# Patient Record
Sex: Female | Born: 1950
Health system: Southern US, Community
[De-identification: ages and names within clinical notes are randomized; demographics above are authoritative.]

## PROBLEM LIST (undated history)

## (undated) DIAGNOSIS — L719 Rosacea, unspecified: Secondary | ICD-10-CM

## (undated) DIAGNOSIS — Z789 Other specified health status: Secondary | ICD-10-CM

## (undated) HISTORY — PX: NO PAST SURGERIES: SHX2092

---

## 2000-11-16 HISTORY — PX: BREAST BIOPSY: SHX20

## 2005-03-13 ENCOUNTER — Ambulatory Visit: Payer: Self-pay | Admitting: Unknown Physician Specialty

## 2006-04-16 ENCOUNTER — Ambulatory Visit: Payer: Self-pay

## 2008-02-20 ENCOUNTER — Ambulatory Visit: Payer: Self-pay | Admitting: General Surgery

## 2009-10-18 ENCOUNTER — Encounter: Payer: Self-pay | Admitting: Orthopedic Surgery

## 2009-11-16 ENCOUNTER — Encounter: Payer: Self-pay | Admitting: Orthopedic Surgery

## 2009-12-17 ENCOUNTER — Encounter: Payer: Self-pay | Admitting: Orthopedic Surgery

## 2010-02-03 ENCOUNTER — Ambulatory Visit: Payer: Self-pay | Admitting: Orthopedic Surgery

## 2012-12-02 ENCOUNTER — Ambulatory Visit: Payer: Self-pay | Admitting: Family Medicine

## 2012-12-02 LAB — COMPREHENSIVE METABOLIC PANEL
Albumin: 4.5 g/dL (ref 3.4–5.0)
Alkaline Phosphatase: 62 U/L (ref 50–136)
Anion Gap: 6 — ABNORMAL LOW (ref 7–16)
BUN: 11 mg/dL (ref 7–18)
Bilirubin,Total: 0.4 mg/dL (ref 0.2–1.0)
Calcium, Total: 9.1 mg/dL (ref 8.5–10.1)
Chloride: 102 mmol/L (ref 98–107)
Co2: 28 mmol/L (ref 21–32)
EGFR (African American): >60
EGFR (Non-African Amer.): >60
Glucose: 89 mg/dL (ref 65–99)
Potassium: 3.8 mmol/L (ref 3.5–5.1)

## 2012-12-02 LAB — CBC WITH DIFFERENTIAL/PLATELET
Basophil #: 0.1 10*3/uL (ref 0.0–0.1)
Basophil %: 1.1 %
Eosinophil #: 0.1 10*3/uL (ref 0.0–0.7)
Eosinophil %: 2.7 %
HCT: 39 % (ref 35.0–47.0)
HGB: 12.6 g/dL (ref 12.0–16.0)
Lymphocyte #: 1.6 10*3/uL (ref 1.0–3.6)
Lymphocyte %: 29.5 %
MCH: 30.1 pg (ref 26.0–34.0)
MCV: 93 fL (ref 80–100)
Monocyte #: 0.4 x10 3/mm (ref 0.2–0.9)
Monocyte %: 7 %
Neutrophil #: 3.2 10*3/uL (ref 1.4–6.5)
Platelet: 318 10*3/uL (ref 150–440)
RDW: 12.6 % (ref 11.5–14.5)
WBC: 5.4 10*3/uL (ref 3.6–11.0)

## 2012-12-02 LAB — TSH: Thyroid Stimulating Horm: 1.66 u[IU]/mL

## 2012-12-23 ENCOUNTER — Ambulatory Visit: Payer: Self-pay | Admitting: Family Medicine

## 2013-06-19 ENCOUNTER — Ambulatory Visit: Payer: Self-pay | Admitting: Family Medicine

## 2013-11-01 ENCOUNTER — Other Ambulatory Visit: Payer: Self-pay | Admitting: Obstetrics and Gynecology

## 2013-11-01 DIAGNOSIS — Z803 Family history of malignant neoplasm of breast: Secondary | ICD-10-CM

## 2013-11-13 ENCOUNTER — Ambulatory Visit
Admission: RE | Admit: 2013-11-13 | Discharge: 2013-11-13 | Disposition: A | Discharge status: Home / Self Care | Payer: BC Managed Care – PPO | Source: Ambulatory Visit | Attending: Obstetrics and Gynecology | Admitting: Obstetrics and Gynecology

## 2013-11-13 DIAGNOSIS — Z803 Family history of malignant neoplasm of breast: Secondary | ICD-10-CM

## 2013-11-13 MED ORDER — GADOBENATE DIMEGLUMINE 529 MG/ML IV SOLN
15.0000 mL | Freq: Once | INTRAVENOUS | Status: AC | PRN
  Administered 2013-11-13: 15 mL via INTRAVENOUS

## 2014-08-08 LAB — CBC AND DIFFERENTIAL
HCT: 39 % (ref 36–46)
Hemoglobin: 13.4 g/dL (ref 12.0–16.0)
PLATELETS: 323 10*3/uL (ref 150–399)
WBC: 4.3 10^3/mL

## 2014-08-08 LAB — LIPID PANEL
Cholesterol: 216 mg/dL — AB (ref 0–200)
HDL: 100 mg/dL — AB (ref 35–70)
LDL Cholesterol: 100 mg/dL
TRIGLYCERIDES: 56 mg/dL (ref 40–160)

## 2014-08-08 LAB — HEPATIC FUNCTION PANEL
ALT: 15 U/L (ref 7–35)
AST: 23 U/L (ref 13–35)

## 2014-08-08 LAB — BASIC METABOLIC PANEL
BUN: 11 mg/dL (ref 4–21)
CREATININE: 0.7 mg/dL (ref 0.5–1.1)
Glucose: 95 mg/dL
POTASSIUM: 4.4 mmol/L (ref 3.4–5.3)
Sodium: 138 mmol/L (ref 137–147)

## 2014-08-08 LAB — TSH: TSH: 1.47 u[IU]/mL (ref 0.41–5.90)

## 2015-08-13 DIAGNOSIS — R319 Hematuria, unspecified: Secondary | ICD-10-CM | POA: Insufficient documentation

## 2015-08-13 DIAGNOSIS — Z8639 Personal history of other endocrine, nutritional and metabolic disease: Secondary | ICD-10-CM | POA: Insufficient documentation

## 2015-08-13 DIAGNOSIS — Z87898 Personal history of other specified conditions: Secondary | ICD-10-CM | POA: Insufficient documentation

## 2015-08-13 DIAGNOSIS — Z8669 Personal history of other diseases of the nervous system and sense organs: Secondary | ICD-10-CM | POA: Insufficient documentation

## 2015-08-14 ENCOUNTER — Ambulatory Visit (INDEPENDENT_AMBULATORY_CARE_PROVIDER_SITE_OTHER): Payer: BC Managed Care – PPO | Admitting: Family Medicine

## 2015-08-14 ENCOUNTER — Encounter: Payer: Self-pay | Admitting: Family Medicine

## 2015-08-14 VITALS — BP 106/70 | HR 74 | Temp 97.4°F | Resp 16 | Ht 69.0 in | Wt 173.0 lb

## 2015-08-14 DIAGNOSIS — Z0000 Encounter for general adult medical examination without abnormal findings: Secondary | ICD-10-CM | POA: Diagnosis not present

## 2015-08-14 DIAGNOSIS — Z1239 Encounter for other screening for malignant neoplasm of breast: Secondary | ICD-10-CM | POA: Diagnosis not present

## 2015-08-14 DIAGNOSIS — Z23 Encounter for immunization: Secondary | ICD-10-CM | POA: Diagnosis not present

## 2015-08-14 DIAGNOSIS — Z1211 Encounter for screening for malignant neoplasm of colon: Secondary | ICD-10-CM | POA: Diagnosis not present

## 2015-08-14 LAB — POCT URINALYSIS DIPSTICK
BILIRUBIN UA: NEGATIVE
GLUCOSE UA: NEGATIVE
Ketones, UA: NEGATIVE
Leukocytes, UA: NEGATIVE
Nitrite, UA: NEGATIVE
Protein, UA: NEGATIVE
RBC UA: NEGATIVE
Urobilinogen, UA: 0.2
pH, UA: 8

## 2015-08-14 LAB — IFOBT (OCCULT BLOOD): IFOBT: NEGATIVE

## 2015-08-14 NOTE — Progress Notes (Addendum)
Patient ID: Allison [name], female   DOB: 1951-07-17, 63 y.o.   MRN: [id number]        Patient: Allison [name], Female    DOB: January 14, 1951, 64 y.o.   MRN: [id number] Visit Date: 08/14/2015  Today's Allison [name]: Allison Rana, MD   Chief Complaint  Patient presents with  . Annual Exam   Subjective:    Annual physical exam Allison [name] is a 64 y.o. female who presents today for health maintenance and complete physical. She feels well. She reports exercising daily. She reports she is sleeping well.  08/08/14 CPE 08/08/14 Pap-neg; HPV-neg 02/20/08 Colonoscopy-WNL, per pt.   Lab Results  Component Value Date   WBC 4.3 08/08/2014   HGB 13.4 08/08/2014   HCT 39 08/08/2014   PLT 323 08/08/2014   GLUCOSE 89 12/02/2012   CHOL 216* 08/08/2014   TRIG 56 08/08/2014   HDL 100* 08/08/2014   LDLCALC 100 08/08/2014   ALT 15 08/08/2014   AST 23 08/08/2014   NA 138 08/08/2014   K 4.4 08/08/2014   CL 102 12/02/2012   CREATININE 0.7 08/08/2014   BUN 11 08/08/2014   CO2 28 12/02/2012   TSH 1.47 08/08/2014   Results for orders placed or performed in visit on 08/14/15  POCT urinalysis dipstick  Result Value Ref Range   Color, UA straw    Clarity, UA clear    Glucose, UA neg    Bilirubin, UA neg    Ketones, UA neg    Spec Grav, UA <=1.005    Blood, UA neg    pH, UA 8.0    Protein, UA neg    Urobilinogen, UA 0.2    Nitrite, UA neg    Leukocytes, UA Negative Negative    -----------------------------------------------------------------   Review of Systems  Constitutional: Negative.   HENT: Negative.   Eyes: Negative.   Respiratory: Negative.   Cardiovascular: Negative.   Gastrointestinal: Negative.   Endocrine: Negative.   Genitourinary: Negative.   Musculoskeletal: Negative.   Skin: Negative.   Allergic/Immunologic: Negative.   Neurological: Negative.   Hematological: Negative.   Psychiatric/Behavioral: Negative.     Social History She  reports that she has  never smoked. She has never used smokeless tobacco. She reports that she does not drink alcohol or use illicit drugs. Social History   Social History  . Marital Status: Married    Spouse Name: N/A  . Number of Children: N/A  . Years of Education: N/A   Social History Main Topics  . Smoking status: Never Smoker   . Smokeless tobacco: Never Used  . Alcohol Use: No     Comment: 2 glasses wine daily  . Drug Use: No  . Sexual Activity: Not Asked   Other Topics Concern  . None   Social History Narrative    Patient Active Problem List   Diagnosis Date Noted  . Blood in the urine 08/13/2015  . H/O hyperthyroidism 08/13/2015  . H/O disease 08/13/2015  . H/O amblyopia 08/13/2015    Past Surgical History  Procedure Laterality Date  . No past surgeries      Family History  Family Status  Relation Status Death Age  . Mother Deceased 72    bladder cancer  . Father Deceased 29    CVA  . Sister Deceased 86    breast cancer   Her family history includes Bladder Cancer in her mother; Breast cancer in her sister; Hyperlipidemia in her father; Hypertension in her brother; Stroke in  her father.    Allergies  Allergen Reactions  . Strawberry     difficulty breathing  . Tetracycline Rash    Previous Medications   B COMPLEX VITAMINS PO    Take by mouth.   CALCIUM CARB-CHOLECALCIFEROL (CALCIUM + D3) 600-200 MG-UNIT TABS    Take by mouth.   CHOLECALCIFEROL (VITAMIN D) 2000 UNITS TABLET    Take by mouth.   FERROUS SULFATE 325 (65 FE) MG TABLET    Take 325 mg by mouth daily with breakfast.   FINACEA 15 % FOAM    APPLY A SMALL AMOUNT ON THE SKIN DAILY    Patient Care Team: Allison Rana, MD as PCP - General (Family Medicine)     Objective:   Vitals: BP 106/70 mmHg  Pulse 74  Temp(Src) 97.4 F (36.3 C) (Oral)  Resp 16  Ht 5\' 9"  (1.753 m)  Wt 173 lb (78.472 kg)  BMI 25.54 kg/m2   Physical Exam  Constitutional: She is oriented to person, place, and time. She appears  well-developed and well-nourished.  HENT:  Head: Normocephalic and atraumatic.  Right Ear: Tympanic membrane, external ear and ear canal normal.  Left Ear: Tympanic membrane, external ear and ear canal normal.  Nose: Nose normal.  Mouth/Throat: Uvula is midline, oropharynx is clear and moist and mucous membranes are normal.  Eyes: Conjunctivae, EOM and lids are normal. Pupils are equal, round, and reactive to light.  Neck: Trachea normal and normal range of motion. Neck supple. Carotid bruit is not present. No thyroid mass and no thyromegaly present.  Cardiovascular: Normal rate, regular rhythm and normal heart sounds.   Pulmonary/Chest: Effort normal and breath sounds normal.  Abdominal: Soft. Normal appearance and bowel sounds are normal. There is no hepatosplenomegaly. There is no tenderness.  Genitourinary: No breast swelling, tenderness or discharge.  Musculoskeletal: Normal range of motion.  Lymphadenopathy:    She has no cervical adenopathy.    She has no axillary adenopathy.  Neurological: She is alert and oriented to person, place, and time. She has normal strength. No cranial nerve deficit.  Skin: Skin is warm, dry and intact.  Psychiatric: She has a normal mood and affect. Her speech is normal and behavior is normal. Judgment and thought content normal. Cognition and memory are normal.     Depression Screen PHQ 2/9 Scores 08/14/2015  PHQ - 2 Score 0   History  Alcohol Use No    Comment: 2 glasses wine daily      Assessment & Plan:     Routine Health Maintenance and Physical Exam  Exercise Activities and Dietary recommendations Goals    . Exercise 150 minutes per week (moderate activity)       Immunization History  Administered Date(s) Administered  . Influenza,inj,Quad PF,36+ Mos 08/14/2015  . Pneumococcal Conjugate-13 08/08/2014    Health Maintenance  Topic Date Due  . Hepatitis C Screening  03-01-1951  . HIV Screening  02/14/1967  . TETANUS/TDAP   02/14/1971  . PAP SMEAR  02/13/1973  . MAMMOGRAM  02/13/2002  . COLONOSCOPY  02/13/2002  . ZOSTAVAX  02/14/2012  . INFLUENZA VACCINE  06/17/2015   1. Annual physical exam Stable. Check labs.  - POCT urinalysis dipstick - CBC with Differential/Platelet - Comprehensive metabolic panel - Lipid Panel With LDL/HDL Ratio - TSH - Vitamin B12 - Vit D  25 hydroxy (rtn osteoporosis monitoring)  2. Breast cancer screening Ordered as below.  - MM Digital Diagnostic Bilat; Future - US BREAST COMPLETE UNI LEFT  INC AXILLA; Future - US BREAST COMPLETE UNI RIGHT INC AXILLA; Future  3. Colon cancer screening Normal.  - IFOBT POC (occult bld, rslt in office)  4. Need for influenza vaccination Given today.  - Flu Vaccine QUAD 36+ mos IM      Patient was seen and examined by Jerrell Belfast, MD, and note scribed by Lynford Humphrey, [place].   I have reviewed the document for accuracy and completeness and I agree with above. Jerrell Belfast, MD   Allison Rana, MD  --------------------------------------------------------------------

## 2015-08-14 NOTE — Patient Instructions (Signed)
Please call the [facility] at [facility] to schedule this at [phone]   

## 2015-08-15 LAB — COMPREHENSIVE METABOLIC PANEL
ALK PHOS: 52 IU/L (ref 39–117)
ALT: 12 IU/L (ref 0–32)
AST: 19 IU/L (ref 0–40)
Albumin/Globulin Ratio: 2.1 (ref 1.1–2.5)
Albumin: 4.6 g/dL (ref 3.6–4.8)
BUN/Creatinine Ratio: 11 (ref 11–26)
BUN: 7 mg/dL — AB (ref 8–27)
Bilirubin Total: 0.5 mg/dL (ref 0.0–1.2)
CO2: 23 mmol/L (ref 18–29)
CREATININE: 0.64 mg/dL (ref 0.57–1.00)
Calcium: 9.5 mg/dL (ref 8.7–10.3)
Chloride: 99 mmol/L (ref 97–108)
GFR calc Af Amer: 110 mL/min/{1.73_m2} (ref >59)
GFR calc non Af Amer: 95 mL/min/{1.73_m2} (ref >59)
GLOBULIN, TOTAL: 2.2 g/dL (ref 1.5–4.5)
GLUCOSE: 95 mg/dL (ref 65–99)
Potassium: 4.5 mmol/L (ref 3.5–5.2)
SODIUM: 139 mmol/L (ref 134–144)
Total Protein: 6.8 g/dL (ref 6.0–8.5)

## 2015-08-15 LAB — VITAMIN B12: VITAMIN B 12: 412 pg/mL (ref 211–946)

## 2015-08-15 LAB — CBC WITH DIFFERENTIAL/PLATELET
BASOS ABS: 0 10*3/uL (ref 0.0–0.2)
Basos: 1 %
EOS (ABSOLUTE): 0.1 10*3/uL (ref 0.0–0.4)
Eos: 3 %
Hematocrit: 37.3 % (ref 34.0–46.6)
Hemoglobin: 12.9 g/dL (ref 11.1–15.9)
Immature Grans (Abs): 0 10*3/uL (ref 0.0–0.1)
Immature Granulocytes: 0 %
LYMPHS ABS: 0.9 10*3/uL (ref 0.7–3.1)
Lymphs: 24 %
MCH: 31.5 pg (ref 26.6–33.0)
MCHC: 34.6 g/dL (ref 31.5–35.7)
MCV: 91 fL (ref 79–97)
MONOS ABS: 0.4 10*3/uL (ref 0.1–0.9)
Monocytes: 9 %
Neutrophils Absolute: 2.4 10*3/uL (ref 1.4–7.0)
Neutrophils: 63 %
Platelets: 308 10*3/uL (ref 150–379)
RBC: 4.1 x10E6/uL (ref 3.77–5.28)
RDW: 12.4 % (ref 12.3–15.4)
WBC: 3.9 10*3/uL (ref 3.4–10.8)

## 2015-08-15 LAB — LIPID PANEL WITH LDL/HDL RATIO
CHOLESTEROL TOTAL: 194 mg/dL (ref 100–199)
HDL: 87 mg/dL (ref >39)
LDL Calculated: 99 mg/dL (ref 0–99)
LDL/HDL RATIO: 1.1 ratio (ref 0.0–3.2)
TRIGLYCERIDES: 41 mg/dL (ref 0–149)
VLDL CHOLESTEROL CAL: 8 mg/dL (ref 5–40)

## 2015-08-15 LAB — TSH: TSH: 2.11 u[IU]/mL (ref 0.450–4.500)

## 2015-08-15 LAB — VITAMIN D 25 HYDROXY (VIT D DEFICIENCY, FRACTURES): Vit D, 25-Hydroxy: 39.5 ng/mL (ref 30.0–100.0)

## 2015-08-15 NOTE — Addendum Note (Signed)
Addended by: Jerrell Belfast on: 08/15/2015 03:03 PM   Modules accepted: Miquel Dunn

## 2015-08-16 ENCOUNTER — Telehealth: Payer: Self-pay

## 2015-08-16 NOTE — Telephone Encounter (Signed)
Advised pt of lab results. Pt verbally acknowledges understanding. Emily Drozdowski, CMA   

## 2015-08-16 NOTE — Telephone Encounter (Signed)
Pt called back.  She said if she didnt pick up please leave a message.  She has already seen her results on my chart but does want you to leave a message regardless.  Thanks Con Memos

## 2015-08-16 NOTE — Telephone Encounter (Signed)
LMTCB Emily Drozdowski, CMA  

## 2015-08-16 NOTE — Telephone Encounter (Signed)
-----   Message from Margarita Rana, MD sent at 08/15/2015 10:28 AM EDT ----- Labs normal. Please notify patient. Thanks.

## 2015-08-23 ENCOUNTER — Other Ambulatory Visit: Payer: Self-pay | Admitting: *Deleted

## 2015-08-23 ENCOUNTER — Inpatient Hospital Stay
Admission: RE | Admit: 2015-08-23 | Discharge: 2015-08-23 | Disposition: A | Discharge status: Home / Self Care | Payer: Self-pay | Source: Ambulatory Visit | Attending: *Deleted | Admitting: *Deleted

## 2015-08-23 DIAGNOSIS — Z139 Encounter for screening, unspecified: Secondary | ICD-10-CM

## 2015-09-02 ENCOUNTER — Ambulatory Visit
Admission: RE | Admit: 2015-09-02 | Discharge: 2015-09-02 | Disposition: A | Discharge status: Home / Self Care | Payer: BC Managed Care – PPO | Source: Ambulatory Visit | Attending: Family Medicine | Admitting: Family Medicine

## 2015-09-02 DIAGNOSIS — Z1239 Encounter for other screening for malignant neoplasm of breast: Secondary | ICD-10-CM

## 2015-09-02 DIAGNOSIS — Z1231 Encounter for screening mammogram for malignant neoplasm of breast: Secondary | ICD-10-CM | POA: Insufficient documentation

## 2015-09-03 ENCOUNTER — Other Ambulatory Visit: Payer: Self-pay | Admitting: Family Medicine

## 2015-09-03 DIAGNOSIS — R928 Other abnormal and inconclusive findings on diagnostic imaging of breast: Secondary | ICD-10-CM

## 2015-09-09 ENCOUNTER — Ambulatory Visit
Admission: RE | Admit: 2015-09-09 | Discharge: 2015-09-09 | Disposition: A | Discharge status: Home / Self Care | Payer: BC Managed Care – PPO | Source: Ambulatory Visit | Attending: Family Medicine | Admitting: Family Medicine

## 2015-09-09 DIAGNOSIS — R928 Other abnormal and inconclusive findings on diagnostic imaging of breast: Secondary | ICD-10-CM | POA: Diagnosis present

## 2016-05-26 ENCOUNTER — Ambulatory Visit (INDEPENDENT_AMBULATORY_CARE_PROVIDER_SITE_OTHER): Payer: BC Managed Care – PPO | Admitting: Physician Assistant

## 2016-05-26 ENCOUNTER — Encounter: Payer: Self-pay | Admitting: Physician Assistant

## 2016-05-26 VITALS — BP 112/70 | HR 64 | Temp 98.1°F | Resp 16 | Wt 175.0 lb

## 2016-05-26 DIAGNOSIS — M5432 Sciatica, left side: Secondary | ICD-10-CM | POA: Diagnosis not present

## 2016-05-26 DIAGNOSIS — M5431 Sciatica, right side: Secondary | ICD-10-CM | POA: Diagnosis not present

## 2016-05-26 DIAGNOSIS — M62830 Muscle spasm of back: Secondary | ICD-10-CM

## 2016-05-26 MED ORDER — CYCLOBENZAPRINE HCL 5 MG PO TABS: 5.0000 mg | ORAL_TABLET | Freq: Three times a day (TID) | ORAL | Status: DC | PRN

## 2016-05-26 MED ORDER — MELOXICAM 15 MG PO TABS: 15.0000 mg | ORAL_TABLET | Freq: Every day | ORAL | Status: DC

## 2016-05-26 NOTE — Progress Notes (Signed)
Patient: Allison [name] Female    DOB: 07-Sep-1951   65 y.o.   MRN: [id number] Visit Date: 05/26/2016  Today's Provider: Mar Daring, PA-C   Chief Complaint  Patient presents with  . Leg Pain   Subjective:    Leg Pain  The incident occurred more than 1 week ago (x 3 weeks). Injury mechanism: after helping washing husband's truck. The pain is present in the left leg (will radiate to left shoulder). The quality of the pain is described as aching and shooting. The pain is at a severity of 3/10 (can get to a 10/10). The pain is mild. The pain has been intermittent since onset. Pertinent negatives include no inability to bear weight, loss of motion, loss of sensation, muscle weakness or numbness (tingling sensation that radiates occasionally down left leg). Associated symptoms comments: Back pain, bruising. Exacerbated by: sitting incorrectly. She has tried acetaminophen for the symptoms. The treatment provided no relief.      Allergies  Allergen Reactions  . Strawberry Extract     difficulty breathing  . Tetracycline Rash   Current Meds  Medication Sig  . B COMPLEX VITAMINS PO Take by mouth.  . Calcium Carb-Cholecalciferol (CALCIUM + D3) 600-200 MG-UNIT TABS Take by mouth.  . Cholecalciferol (VITAMIN D) 2000 UNITS tablet Take by mouth.  . ferrous sulfate 325 (65 FE) MG tablet Take 325 mg by mouth daily with breakfast.  . FINACEA 15 % FOAM APPLY A SMALL AMOUNT ON THE SKIN DAILY    Review of Systems  Constitutional: Negative for fever, chills, diaphoresis, activity change, appetite change, fatigue and unexpected weight change.  HENT: Negative.   Respiratory: Negative for cough, chest tightness and shortness of breath.   Cardiovascular: Positive for leg swelling. Negative for chest pain and palpitations.  Musculoskeletal: Positive for myalgias and back pain. Negative for joint swelling, arthralgias, gait problem, neck pain and neck stiffness.  Neurological: Negative  for weakness and numbness (tingling sensation that radiates occasionally down left leg).  Hematological: Bruises/bleeds easily.    Social History  Substance Use Topics  . Smoking status: Never Smoker   . Smokeless tobacco: Never Used  . Alcohol Use: No     Comment: 2 glasses wine daily   Objective:   BP 112/70 mmHg  Pulse 64  Temp(Src) 98.1 F (36.7 C) (Oral)  Resp 16  Wt 175 lb (79.379 kg)  Physical Exam  Constitutional: She appears well-developed and well-nourished. No distress.  Neck: Normal range of motion. Neck supple.  Cardiovascular: Normal rate, regular rhythm and normal heart sounds.  Exam reveals no gallop and no friction rub.   No murmur heard. Pulmonary/Chest: Effort normal and breath sounds normal. No respiratory distress. She has no wheezes. She has no rales.  Musculoskeletal:       Cervical back: She exhibits spasm (spasm noted in upper trapezius on left). She exhibits normal range of motion, no tenderness and no bony tenderness.       Thoracic back: She exhibits normal range of motion, no tenderness, no bony tenderness and no spasm.       Lumbar back: She exhibits tenderness (over ischial tuberosity bilaterally). She exhibits normal range of motion, no bony tenderness, no deformity, no spasm and normal pulse.  Radiating symptoms down left leg greater than right with palpation of ischial tuberosity  Skin: She is not diaphoretic.  Vitals reviewed.      Assessment & Plan:     1. Bilateral sciatica Will  give meloxicam and flexeril as below. She is to try for 2 weeks. If symptoms worsen or fail to improve she is to call the office and we will go forth with obtaining xray of lumbar spine.  - meloxicam (MOBIC) 15 MG tablet; Take 1 tablet (15 mg total) by mouth daily.  Dispense: 30 tablet; Refill: 0 - cyclobenzaprine (FLEXERIL) 5 MG tablet; Take 1 tablet (5 mg total) by mouth 3 (three) times daily as needed for muscle spasms.  Dispense: 30 tablet; Refill: 1  2.  Muscle spasm of back See above medical treatment plan. - cyclobenzaprine (FLEXERIL) 5 MG tablet; Take 1 tablet (5 mg total) by mouth 3 (three) times daily as needed for muscle spasms.  Dispense: 30 tablet; Refill: [place], PA-C  [facility]

## 2016-05-26 NOTE — Patient Instructions (Signed)
Sciatica With Rehab The sciatic nerve runs from the back down the leg and is responsible for sensation and control of the muscles in the back (posterior) side of the thigh, lower leg, and foot. Sciatica is a condition that is characterized by inflammation of this nerve.  SYMPTOMS   Signs of nerve damage, including numbness and/or weakness along the posterior side of the lower extremity.  Pain in the back of the thigh that may also travel down the leg.  Pain that worsens when sitting for long periods of time.  Occasionally, pain in the back or buttock. CAUSES  Inflammation of the sciatic nerve is the cause of sciatica. The inflammation is due to something irritating the nerve. Common sources of irritation include:  Sitting for long periods of time.  Direct trauma to the nerve.  Arthritis of the spine.  Herniated or ruptured disk.  Slipping of the vertebrae (spondylolisthesis).  Pressure from soft tissues, such as muscles or ligament-like tissue (fascia). RISK INCREASES WITH:  Sports that place pressure or stress on the spine (football or weightlifting).  Poor strength and flexibility.  Failure to warm up properly before activity.  Family history of low back pain or disk disorders.  Previous back injury or surgery.  Poor body mechanics, especially when lifting, or poor posture. PREVENTION   Warm up and stretch properly before activity.  Maintain physical fitness:  Strength, flexibility, and endurance.  Cardiovascular fitness.  Learn and use proper technique, especially with posture and lifting. When possible, have coach correct improper technique.  Avoid activities that place stress on the spine. PROGNOSIS If treated properly, then sciatica usually resolves within 6 weeks. However, occasionally surgery is necessary.  RELATED COMPLICATIONS   Permanent nerve damage, including pain, numbness, tingle, or weakness.  Chronic back pain.  Risks of surgery: infection,  bleeding, nerve damage, or damage to surrounding tissues. TREATMENT Treatment initially involves resting from any activities that aggravate your symptoms. The use of ice and medication may help reduce pain and inflammation. The use of strengthening and stretching exercises may help reduce pain with activity. These exercises may be performed at home or with referral to a therapist. A therapist may recommend further treatments, such as transcutaneous electronic nerve stimulation (TENS) or ultrasound. Your caregiver may recommend corticosteroid injections to help reduce inflammation of the sciatic nerve. If symptoms persist despite non-surgical (conservative) treatment, then surgery may be recommended. MEDICATION  If pain medication is necessary, then nonsteroidal anti-inflammatory medications, such as aspirin and ibuprofen, or other minor pain relievers, such as acetaminophen, are often recommended.  Do not take pain medication for 7 days before surgery.  Prescription pain relievers may be given if deemed necessary by your caregiver. Use only as directed and only as much as you need.  Ointments applied to the skin may be helpful.  Corticosteroid injections may be given by your caregiver. These injections should be reserved for the most serious cases, because they may only be given a certain number of times. HEAT AND COLD  Cold treatment (icing) relieves pain and reduces inflammation. Cold treatment should be applied for 10 to 15 minutes every 2 to 3 hours for inflammation and pain and immediately after any activity that aggravates your symptoms. Use ice packs or massage the area with a piece of ice (ice massage).  Heat treatment may be used prior to performing the stretching and strengthening activities prescribed by your caregiver, physical therapist, or athletic trainer. Use a heat pack or soak the injury in warm water.   SEEK MEDICAL CARE IF:  Treatment seems to offer no benefit, or the condition  worsens.  Any medications produce adverse side effects. EXERCISES  RANGE OF MOTION (ROM) AND STRETCHING EXERCISES - Sciatica Most people with sciatic will find that their symptoms worsen with either excessive bending forward (flexion) or arching at the low back (extension). The exercises which will help resolve your symptoms will focus on the opposite motion. Your physician, physical therapist or athletic trainer will help you determine which exercises will be most helpful to resolve your low back pain. Do not complete any exercises without first consulting with your clinician. Discontinue any exercises which worsen your symptoms until you speak to your clinician. If you have pain, numbness or tingling which travels down into your buttocks, leg or foot, the goal of the therapy is for these symptoms to move closer to your back and eventually resolve. Occasionally, these leg symptoms will get better, but your low back pain may worsen; this is typically an indication of progress in your rehabilitation. Be certain to be very alert to any changes in your symptoms and the activities in which you participated in the 24 hours prior to the change. Sharing this information with your clinician will allow him/her to most efficiently treat your condition. These exercises may help you when beginning to rehabilitate your injury. Your symptoms may resolve with or without further involvement from your physician, physical therapist or athletic trainer. While completing these exercises, remember:   Restoring tissue flexibility helps normal motion to return to the joints. This allows healthier, less painful movement and activity.  An effective stretch should be held for at least 30 seconds.  A stretch should never be painful. You should only feel a gentle lengthening or release in the stretched tissue. FLEXION RANGE OF MOTION AND STRETCHING EXERCISES: STRETCH - Flexion, Single Knee to Chest   Lie on a firm bed or floor  with both legs extended in front of you.  Keeping one leg in contact with the floor, bring your opposite knee to your chest. Hold your leg in place by either grabbing behind your thigh or at your knee.  Pull until you feel a gentle stretch in your low back. Hold __________ seconds.  Slowly release your grasp and repeat the exercise with the opposite side. Repeat __________ times. Complete this exercise __________ times per day.  STRETCH - Flexion, Double Knee to Chest  Lie on a firm bed or floor with both legs extended in front of you.  Keeping one leg in contact with the floor, bring your opposite knee to your chest.  Tense your stomach muscles to support your back and then lift your other knee to your chest. Hold your legs in place by either grabbing behind your thighs or at your knees.  Pull both knees toward your chest until you feel a gentle stretch in your low back. Hold __________ seconds.  Tense your stomach muscles and slowly return one leg at a time to the floor. Repeat __________ times. Complete this exercise __________ times per day.  STRETCH - Low Trunk Rotation   Lie on a firm bed or floor. Keeping your legs in front of you, bend your knees so they are both pointed toward the ceiling and your feet are flat on the floor.  Extend your arms out to the side. This will stabilize your upper body by keeping your shoulders in contact with the floor.  Gently and slowly drop both knees together to one side until   you feel a gentle stretch in your low back. Hold for __________ seconds.  Tense your stomach muscles to support your low back as you bring your knees back to the starting position. Repeat the exercise to the other side. Repeat __________ times. Complete this exercise __________ times per day  EXTENSION RANGE OF MOTION AND FLEXIBILITY EXERCISES: STRETCH - Extension, Prone on Elbows  Lie on your stomach on the floor, a bed will be too soft. Place your palms about shoulder  width apart and at the height of your head.  Place your elbows under your shoulders. If this is too painful, stack pillows under your chest.  Allow your body to relax so that your hips drop lower and make contact more completely with the floor.  Hold this position for __________ seconds.  Slowly return to lying flat on the floor. Repeat __________ times. Complete this exercise __________ times per day.  RANGE OF MOTION - Extension, Prone Press Ups  Lie on your stomach on the floor, a bed will be too soft. Place your palms about shoulder width apart and at the height of your head.  Keeping your back as relaxed as possible, slowly straighten your elbows while keeping your hips on the floor. You may adjust the placement of your hands to maximize your comfort. As you gain motion, your hands will come more underneath your shoulders.  Hold this position __________ seconds.  Slowly return to lying flat on the floor. Repeat __________ times. Complete this exercise __________ times per day.  STRENGTHENING EXERCISES - Sciatica  These exercises may help you when beginning to rehabilitate your injury. These exercises should be done near your "sweet spot." This is the neutral, low-back arch, somewhere between fully rounded and fully arched, that is your least painful position. When performed in this safe range of motion, these exercises can be used for people who have either a flexion or extension based injury. These exercises may resolve your symptoms with or without further involvement from your physician, physical therapist or athletic trainer. While completing these exercises, remember:   Muscles can gain both the endurance and the strength needed for everyday activities through controlled exercises.  Complete these exercises as instructed by your physician, physical therapist or athletic trainer. Progress with the resistance and repetition exercises only as your caregiver advises.  You may  experience muscle soreness or fatigue, but the pain or discomfort you are trying to eliminate should never worsen during these exercises. If this pain does worsen, stop and make certain you are following the directions exactly. If the pain is still present after adjustments, discontinue the exercise until you can discuss the trouble with your clinician. STRENGTHENING - Deep Abdominals, Pelvic Tilt   Lie on a firm bed or floor. Keeping your legs in front of you, bend your knees so they are both pointed toward the ceiling and your feet are flat on the floor.  Tense your lower abdominal muscles to press your low back into the floor. This motion will rotate your pelvis so that your tail bone is scooping upwards rather than pointing at your feet or into the floor.  With a gentle tension and even breathing, hold this position for __________ seconds. Repeat __________ times. Complete this exercise __________ times per day.  STRENGTHENING - Abdominals, Crunches   Lie on a firm bed or floor. Keeping your legs in front of you, bend your knees so they are both pointed toward the ceiling and your feet are flat on the   floor. Cross your arms over your chest.  Slightly tip your chin down without bending your neck.  Tense your abdominals and slowly lift your trunk high enough to just clear your shoulder blades. Lifting higher can put excessive stress on the low back and does not further strengthen your abdominal muscles.  Control your return to the starting position. Repeat __________ times. Complete this exercise __________ times per day.  STRENGTHENING - Quadruped, Opposite UE/LE Lift  Assume a hands and knees position on a firm surface. Keep your hands under your shoulders and your knees under your hips. You may place padding under your knees for comfort.  Find your neutral spine and gently tense your abdominal muscles so that you can maintain this position. Your shoulders and hips should form a rectangle  that is parallel with the floor and is not twisted.  Keeping your trunk steady, lift your right hand no higher than your shoulder and then your left leg no higher than your hip. Make sure you are not holding your breath. Hold this position __________ seconds.  Continuing to keep your abdominal muscles tense and your back steady, slowly return to your starting position. Repeat with the opposite arm and leg. Repeat __________ times. Complete this exercise __________ times per day.  STRENGTHENING - Abdominals and Quadriceps, Straight Leg Raise   Lie on a firm bed or floor with both legs extended in front of you.  Keeping one leg in contact with the floor, bend the other knee so that your foot can rest flat on the floor.  Find your neutral spine, and tense your abdominal muscles to maintain your spinal position throughout the exercise.  Slowly lift your straight leg off the floor about 6 inches for a count of 15, making sure to not hold your breath.  Still keeping your neutral spine, slowly lower your leg all the way to the floor. Repeat this exercise with each leg __________ times. Complete this exercise __________ times per day. POSTURE AND BODY MECHANICS CONSIDERATIONS - Sciatica Keeping correct posture when sitting, standing or completing your activities will reduce the stress put on different body tissues, allowing injured tissues a chance to heal and limiting painful experiences. The following are general guidelines for improved posture. Your physician or physical therapist will provide you with any instructions specific to your needs. While reading these guidelines, remember:  The exercises prescribed by your provider will help you have the flexibility and strength to maintain correct postures.  The correct posture provides the optimal environment for your joints to work. All of your joints have less wear and tear when properly supported by a spine with good posture. This means you will  experience a healthier, less painful body.  Correct posture must be practiced with all of your activities, especially prolonged sitting and standing. Correct posture is as important when doing repetitive low-stress activities (typing) as it is when doing a single heavy-load activity (lifting). RESTING POSITIONS Consider which positions are most painful for you when choosing a resting position. If you have pain with flexion-based activities (sitting, bending, stooping, squatting), choose a position that allows you to rest in a less flexed posture. You would want to avoid curling into a fetal position on your side. If your pain worsens with extension-based activities (prolonged standing, working overhead), avoid resting in an extended position such as sleeping on your stomach. Most people will find more comfort when they rest with their spine in a more neutral position, neither too rounded nor too   arched. Lying on a non-sagging bed on your side with a pillow between your knees, or on your back with a pillow under your knees will often provide some relief. Keep in mind, being in any one position for a prolonged period of time, no matter how correct your posture, can still lead to stiffness. PROPER SITTING POSTURE In order to minimize stress and discomfort on your spine, you must sit with correct posture Sitting with good posture should be effortless for a healthy body. Returning to good posture is a gradual process. Many people can work toward this most comfortably by using various supports until they have the flexibility and strength to maintain this posture on their own. When sitting with proper posture, your ears will fall over your shoulders and your shoulders will fall over your hips. You should use the back of the chair to support your upper back. Your low back will be in a neutral position, just slightly arched. You may place a small pillow or folded towel at the base of your low back for support.  When  working at a desk, create an environment that supports good, upright posture. Without extra support, muscles fatigue and lead to excessive strain on joints and other tissues. Keep these recommendations in mind: CHAIR:   A chair should be able to slide under your desk when your back makes contact with the back of the chair. This allows you to work closely.  The chair's height should allow your eyes to be level with the upper part of your monitor and your hands to be slightly lower than your elbows. BODY POSITION  Your feet should make contact with the floor. If this is not possible, use a foot rest.  Keep your ears over your shoulders. This will reduce stress on your neck and low back. INCORRECT SITTING POSTURES   If you are feeling tired and unable to assume a healthy sitting posture, do not slouch or slump. This puts excessive strain on your back tissues, causing more damage and pain. Healthier options include:  Using more support, like a lumbar pillow.  Switching tasks to something that requires you to be upright or walking.  Talking a brief walk.  Lying down to rest in a neutral-spine position. PROLONGED STANDING WHILE SLIGHTLY LEANING FORWARD  When completing a task that requires you to lean forward while standing in one place for a long time, place either foot up on a stationary 2-4 inch high object to help maintain the best posture. When both feet are on the ground, the low back tends to lose its slight inward curve. If this curve flattens (or becomes too large), then the back and your other joints will experience too much stress, fatigue more quickly and can cause pain.  CORRECT STANDING POSTURES Proper standing posture should be assumed with all daily activities, even if they only take a few moments, like when brushing your teeth. As in sitting, your ears should fall over your shoulders and your shoulders should fall over your hips. You should keep a slight tension in your abdominal  muscles to brace your spine. Your tailbone should point down to the ground, not behind your body, resulting in an over-extended swayback posture.  INCORRECT STANDING POSTURES  Common incorrect standing postures include a forward head, locked knees and/or an excessive swayback. WALKING Walk with an upright posture. Your ears, shoulders and hips should all line-up. PROLONGED ACTIVITY IN A FLEXED POSITION When completing a task that requires you to bend forward   at your waist or lean over a low surface, try to find a way to stabilize 3 of 4 of your limbs. You can place a hand or elbow on your thigh or rest a knee on the surface you are reaching across. This will provide you more stability so that your muscles do not fatigue as quickly. By keeping your knees relaxed, or slightly bent, you will also reduce stress across your low back. CORRECT LIFTING TECHNIQUES DO :   Assume a wide stance. This will provide you more stability and the opportunity to get as close as possible to the object which you are lifting.  Tense your abdominals to brace your spine; then bend at the knees and hips. Keeping your back locked in a neutral-spine position, lift using your leg muscles. Lift with your legs, keeping your back straight.  Test the weight of unknown objects before attempting to lift them.  Try to keep your elbows locked down at your sides in order get the best strength from your shoulders when carrying an object.  Always ask for help when lifting heavy or awkward objects. INCORRECT LIFTING TECHNIQUES DO NOT:   Lock your knees when lifting, even if it is a small object.  Bend and twist. Pivot at your feet or move your feet when needing to change directions.  Assume that you cannot safely pick up a paperclip without proper posture.   This information is not intended to replace advice given to you by your health care provider. Make sure you discuss any questions you have with your health care provider.     Document Released: 11/02/2005 Document Revised: 03/19/2015 Document Reviewed: 02/14/2009 [employer] Patient Education 2016 [employer]  

## 2016-08-14 ENCOUNTER — Encounter: Payer: BC Managed Care – PPO | Admitting: Family Medicine

## 2016-08-14 ENCOUNTER — Ambulatory Visit (INDEPENDENT_AMBULATORY_CARE_PROVIDER_SITE_OTHER): Payer: BC Managed Care – PPO | Admitting: Physician Assistant

## 2016-08-14 ENCOUNTER — Encounter: Payer: Self-pay | Admitting: Physician Assistant

## 2016-08-14 VITALS — BP 112/72 | HR 66 | Temp 98.1°F | Resp 16 | Ht 69.0 in | Wt 175.0 lb

## 2016-08-14 DIAGNOSIS — Z1159 Encounter for screening for other viral diseases: Secondary | ICD-10-CM | POA: Diagnosis not present

## 2016-08-14 DIAGNOSIS — M5432 Sciatica, left side: Secondary | ICD-10-CM

## 2016-08-14 DIAGNOSIS — Z23 Encounter for immunization: Secondary | ICD-10-CM | POA: Diagnosis not present

## 2016-08-14 DIAGNOSIS — Z126 Encounter for screening for malignant neoplasm of bladder: Secondary | ICD-10-CM

## 2016-08-14 DIAGNOSIS — R319 Hematuria, unspecified: Secondary | ICD-10-CM | POA: Diagnosis not present

## 2016-08-14 DIAGNOSIS — Z833 Family history of diabetes mellitus: Secondary | ICD-10-CM

## 2016-08-14 DIAGNOSIS — Z1239 Encounter for other screening for malignant neoplasm of breast: Secondary | ICD-10-CM

## 2016-08-14 DIAGNOSIS — Z136 Encounter for screening for cardiovascular disorders: Secondary | ICD-10-CM

## 2016-08-14 DIAGNOSIS — Z13220 Encounter for screening for lipoid disorders: Secondary | ICD-10-CM

## 2016-08-14 DIAGNOSIS — M5431 Sciatica, right side: Secondary | ICD-10-CM | POA: Diagnosis not present

## 2016-08-14 DIAGNOSIS — Z0000 Encounter for general adult medical examination without abnormal findings: Secondary | ICD-10-CM

## 2016-08-14 LAB — POCT URINALYSIS DIPSTICK
Bilirubin, UA: NEGATIVE
Glucose, UA: NEGATIVE
KETONES UA: NEGATIVE
Leukocytes, UA: NEGATIVE
Nitrite, UA: NEGATIVE
PROTEIN UA: NEGATIVE
SPEC GRAV UA: 1.015
Urobilinogen, UA: 0.2
pH, UA: 6

## 2016-08-14 MED ORDER — MELOXICAM 15 MG PO TABS: 15.0000 mg | ORAL_TABLET | Freq: Every day | ORAL | 3 refills | Status: DC

## 2016-08-14 NOTE — Progress Notes (Signed)
Patient: Allison [name], Female    DOB: 1951-09-01, 65 y.o.   MRN: [id number] Visit Date: 08/14/2016  Today's Provider: Mar Daring, PA-C   Chief Complaint  Patient presents with  . Annual Exam   Subjective:    Annual physical exam Allison [name] is a 65 y.o. female who presents today for health maintenance and complete physical. She feels well, states that her left leg aches but thinks is related to her joint pain. She reports exercising. She reports she is sleeping well. She reports she had a Colonoscopy on 02/20/08 with Dr. Bary Castilla. 08/14/15 CPE 08/14/15 Mammogram-BI-RADS 1 08/08/14 Pap-negative; HPV-negative Flu vaccine given today. Patient refuses Tdap. -----------------------------------------------------------------   Review of Systems  Constitutional: Negative.   HENT: Negative.   Eyes: Negative.   Respiratory: Negative.   Cardiovascular: Negative.   Gastrointestinal: Negative.   Endocrine: Negative.   Genitourinary: Negative.   Musculoskeletal: Positive for arthralgias.  Skin: Negative.   Allergic/Immunologic: Negative.   Neurological: Negative.   Hematological: Bruises/bleeds easily.  Psychiatric/Behavioral: Negative.     Social History      She  reports that she has never smoked. She has never used smokeless tobacco. She reports that she drinks alcohol. She reports that she does not use drugs.       Social History   Social History  . Marital status: Married    Spouse name: N/A  . Number of children: N/A  . Years of education: N/A   Social History Main Topics  . Smoking status: Never Smoker  . Smokeless tobacco: Never Used  . Alcohol use Yes     Comment: 2 glasses wine daily  . Drug use: No  . Sexual activity: Not Asked   Other Topics Concern  . None   Social History Narrative  . None    History reviewed. No pertinent past medical history.   Patient Active Problem List   Diagnosis Date Noted  . Blood in the urine  08/13/2015  . H/O hyperthyroidism 08/13/2015  . H/O disease 08/13/2015  . H/O amblyopia 08/13/2015    Past Surgical History:  Procedure Laterality Date  . BREAST BIOPSY Left 2002   benign  . NO PAST SURGERIES      Family History        Family Status  Relation Status  . Mother Deceased at age 84   bladder cancer  . Father Deceased at age 98   CVA  . Sister Deceased at age 5   breast cancer  . Brother Alive  . Brother Alive        Her family history includes Bladder Cancer in her mother; Breast cancer (age of onset: 17) in her sister; Cancer in her mother; Cancer (age of onset: 9) in her sister; Diverticulosis in her brother; Hyperlipidemia in her brother and father; Kidney Stones in her brother and brother; Stroke in her father.    Allergies  Allergen Reactions  . Strawberry Extract     difficulty breathing  . Tetracycline Rash    Current Meds  Medication Sig  . B COMPLEX VITAMINS PO Take by mouth.  . Calcium Carb-Cholecalciferol (CALCIUM + D3) 600-200 MG-UNIT TABS Take by mouth.  . Cholecalciferol (VITAMIN D) 2000 UNITS tablet Take by mouth.  . cyclobenzaprine (FLEXERIL) 5 MG tablet Take 1 tablet (5 mg total) by mouth 3 (three) times daily as needed for muscle spasms.  . ferrous sulfate 325 (65 FE) MG tablet Take 325 mg by mouth daily  with breakfast.  . FINACEA 15 % FOAM APPLY A SMALL AMOUNT ON THE SKIN DAILY  . meloxicam (MOBIC) 15 MG tablet Take 1 tablet (15 mg total) by mouth daily.    Patient Care Team: Mar Daring, PA-C as PCP - General (Family Medicine)     Objective:   Vitals: BP 112/72 (BP Location: Right Arm, Patient Position: Sitting, Cuff Size: Normal)   Pulse 66   Temp 98.1 F (36.7 C) (Oral)   Resp 16   Ht 5\' 9"  (1.753 m)   Wt 175 lb (79.4 kg)   BMI 25.84 kg/m    Physical Exam  Constitutional: She is oriented to person, place, and time. She appears well-developed and well-nourished. No distress.  HENT:  Head: Normocephalic and  atraumatic.  Right Ear: Tympanic membrane, external ear and ear canal normal.  Left Ear: Tympanic membrane, external ear and ear canal normal.  Nose: Nose normal.  Mouth/Throat: Uvula is midline, oropharynx is clear and moist and mucous membranes are normal. No oropharyngeal exudate.  Eyes: Conjunctivae and EOM are normal. Pupils are equal, round, and reactive to light. Right eye exhibits no discharge. Left eye exhibits no discharge. No scleral icterus.  Neck: Normal range of motion. Neck supple. No JVD present. Carotid bruit is not present. No tracheal deviation present. No thyromegaly present.  Cardiovascular: Normal rate, regular rhythm, normal heart sounds and intact distal pulses.  Exam reveals no gallop and no friction rub.   No murmur heard. Pulmonary/Chest: Effort normal and breath sounds normal. No respiratory distress. She has no wheezes. She has no rales. She exhibits no tenderness. Right breast exhibits no inverted nipple, no mass, no nipple discharge, no skin change and no tenderness. Left breast exhibits no inverted nipple, no mass, no nipple discharge, no skin change and no tenderness. Breasts are symmetrical.  Abdominal: Soft. Bowel sounds are normal. She exhibits no distension and no mass. There is no tenderness. There is no rebound and no guarding.  Musculoskeletal: Normal range of motion. She exhibits no edema or tenderness.  Lymphadenopathy:    She has no cervical adenopathy.  Neurological: She is alert and oriented to person, place, and time.  Skin: Skin is warm and dry. No rash noted. She is not diaphoretic.  Psychiatric: She has a normal mood and affect. Her behavior is normal. Judgment and thought content normal.  Vitals reviewed.  Depression Screen PHQ 2/9 Scores 08/14/2015  PHQ - 2 Score 0    Assessment & Plan:     Routine Health Maintenance and Physical Exam  Exercise Activities and Dietary recommendations Goals    . Exercise 150 minutes per week (moderate  activity)       Immunization History  Administered Date(s) Administered  . Influenza,inj,Quad PF,36+ Mos 08/14/2015  . Pneumococcal Conjugate-13 08/08/2014    Health Maintenance  Topic Date Due  . Hepatitis C Screening  [birth year]  . HIV Screening  02/14/1967  . TETANUS/TDAP  02/14/1971  . COLONOSCOPY  02/13/2002  . ZOSTAVAX  02/14/2012  . INFLUENZA VACCINE  06/16/2016  . MAMMOGRAM  09/01/2017  . PAP SMEAR  08/13/2018      Discussed health benefits of physical activity, and encouraged her to engage in regular exercise appropriate for her age and condition.   1. Annual physical exam Normal physical exam today. Will check labs as below and f/u pending lab results. If labs are stable and WNL she will not need to have these rechecked for one year at her next annual physical  exam. She is to call the office in the meantime if she has any acute issue, questions or concerns. - CBC with Differential/Platelet - Comprehensive metabolic panel - TSH  2. Breast cancer screening Breast exam today was normal. There is family history of breast cancer in her sister (developed in her 75s then had recurrence. She does perform regular self breast exams. Mammogram was ordered as below. Information for [facility] clinic was given to patient so she may schedule her mammogram at her convenience. - MM DIGITAL SCREENING BILATERAL; Future  3. Encounter for lipid screening for cardiovascular disease Will check labs as below and f/u pending results. - Lipid panel  4. Need for hepatitis C screening test - Hepatitis C Antibody  5. Family history of diabetes mellitus (DM) Will check labs as below and f/u pending results. - Hemoglobin A1c  6. Bladder cancer screening Family history of bladder cancer in her mother. UA did have trace RBC. Will send for microscopy to verify. - POCT Urinalysis Dipstick  7. Hematuria See above medical treatment plan. - Urinalysis, microscopic only  8. Need for  influenza vaccination Flu vaccine given today without complication. Patient sat upright for 15 minutes to check for adverse reaction before being released. - Flu Vaccine QUAD 36+ mos IM   --------------------------------------------------------------------    Mar Daring, PA-C  [facility]

## 2016-08-14 NOTE — Patient Instructions (Signed)

## 2016-08-14 NOTE — Addendum Note (Signed)
Addended by: Mar Daring on: 08/14/2016 10:13 AM   Modules accepted: Orders

## 2016-08-15 LAB — CBC WITH DIFFERENTIAL/PLATELET
BASOS ABS: 0.1 10*3/uL (ref 0.0–0.2)
Basos: 2 %
EOS (ABSOLUTE): 0.2 10*3/uL (ref 0.0–0.4)
Eos: 5 %
HEMATOCRIT: 35.4 % (ref 34.0–46.6)
Hemoglobin: 11.6 g/dL (ref 11.1–15.9)
IMMATURE GRANULOCYTES: 0 %
Immature Grans (Abs): 0 10*3/uL (ref 0.0–0.1)
LYMPHS ABS: 1.1 10*3/uL (ref 0.7–3.1)
Lymphs: 28 %
MCH: 30.3 pg (ref 26.6–33.0)
MCHC: 32.8 g/dL (ref 31.5–35.7)
MCV: 92 fL (ref 79–97)
MONOS ABS: 0.4 10*3/uL (ref 0.1–0.9)
Monocytes: 9 %
NEUTROS PCT: 56 %
Neutrophils Absolute: 2.2 10*3/uL (ref 1.4–7.0)
PLATELETS: 331 10*3/uL (ref 150–379)
RBC: 3.83 x10E6/uL (ref 3.77–5.28)
RDW: 12.9 % (ref 12.3–15.4)
WBC: 3.9 10*3/uL (ref 3.4–10.8)

## 2016-08-15 LAB — COMPREHENSIVE METABOLIC PANEL
ALT: 14 IU/L (ref 0–32)
AST: 19 IU/L (ref 0–40)
Albumin/Globulin Ratio: 1.9 (ref 1.2–2.2)
Albumin: 4.7 g/dL (ref 3.6–4.8)
Alkaline Phosphatase: 54 IU/L (ref 39–117)
BUN/Creatinine Ratio: 18 (ref 12–28)
BUN: 13 mg/dL (ref 8–27)
Bilirubin Total: 0.3 mg/dL (ref 0.0–1.2)
CALCIUM: 9.5 mg/dL (ref 8.7–10.3)
CO2: 26 mmol/L (ref 18–29)
CREATININE: 0.71 mg/dL (ref 0.57–1.00)
Chloride: 98 mmol/L (ref 96–106)
GFR calc Af Amer: 104 mL/min/{1.73_m2} (ref >59)
GFR, EST NON AFRICAN AMERICAN: 90 mL/min/{1.73_m2} (ref >59)
GLOBULIN, TOTAL: 2.5 g/dL (ref 1.5–4.5)
Glucose: 83 mg/dL (ref 65–99)
POTASSIUM: 4.7 mmol/L (ref 3.5–5.2)
SODIUM: 139 mmol/L (ref 134–144)
Total Protein: 7.2 g/dL (ref 6.0–8.5)

## 2016-08-15 LAB — HEMOGLOBIN A1C
ESTIMATED AVERAGE GLUCOSE: 105 mg/dL
HEMOGLOBIN A1C: 5.3 % (ref 4.8–5.6)

## 2016-08-15 LAB — URINALYSIS, MICROSCOPIC ONLY
CASTS: NONE SEEN /LPF
RBC MICROSCOPIC, UA: NONE SEEN /HPF (ref 0–?)
WBC, UA: NONE SEEN /hpf (ref 0–?)

## 2016-08-15 LAB — HEPATITIS C ANTIBODY: HEP C VIRUS AB: 0.1 {s_co_ratio} (ref 0.0–0.9)

## 2016-08-15 LAB — LIPID PANEL
CHOLESTEROL TOTAL: 194 mg/dL (ref 100–199)
Chol/HDL Ratio: 2.3 ratio units (ref 0.0–4.4)
HDL: 86 mg/dL (ref >39)
LDL Calculated: 98 mg/dL (ref 0–99)
Triglycerides: 51 mg/dL (ref 0–149)
VLDL Cholesterol Cal: 10 mg/dL (ref 5–40)

## 2016-08-15 LAB — TSH: TSH: 1.44 u[IU]/mL (ref 0.450–4.500)

## 2016-09-09 ENCOUNTER — Ambulatory Visit
Admission: RE | Admit: 2016-09-09 | Discharge: 2016-09-09 | Disposition: A | Discharge status: Home / Self Care | Payer: BC Managed Care – PPO | Source: Ambulatory Visit | Attending: Physician Assistant | Admitting: Physician Assistant

## 2016-09-09 DIAGNOSIS — Z1239 Encounter for other screening for malignant neoplasm of breast: Secondary | ICD-10-CM

## 2016-09-09 DIAGNOSIS — Z1231 Encounter for screening mammogram for malignant neoplasm of breast: Secondary | ICD-10-CM | POA: Diagnosis present

## 2016-09-10 ENCOUNTER — Telehealth: Payer: Self-pay

## 2016-09-10 NOTE — Telephone Encounter (Signed)
-----   Message from Mar Daring, [place] sent at 09/10/2016  8:43 AM EDT ----- Normal mammogram. Repeat screening in one year.

## 2016-09-10 NOTE — Telephone Encounter (Signed)
LMTCB

## 2016-09-11 NOTE — Telephone Encounter (Signed)
Left detailed message on vm. Per DPR

## 2016-11-03 ENCOUNTER — Ambulatory Visit (INDEPENDENT_AMBULATORY_CARE_PROVIDER_SITE_OTHER): Payer: BC Managed Care – PPO | Admitting: Physician Assistant

## 2016-11-03 ENCOUNTER — Encounter: Payer: Self-pay | Admitting: Physician Assistant

## 2016-11-03 VITALS — BP 110/68 | HR 76 | Temp 98.1°F | Resp 16 | Wt 180.0 lb

## 2016-11-03 DIAGNOSIS — L03032 Cellulitis of left toe: Secondary | ICD-10-CM | POA: Diagnosis not present

## 2016-11-03 MED ORDER — CEPHALEXIN 500 MG PO CAPS: 500.0000 mg | ORAL_CAPSULE | Freq: Two times a day (BID) | ORAL | 0 refills | Status: DC
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
## 2016-11-03 NOTE — Patient Instructions (Signed)
Cephalexin tablets or capsules What is this medicine? CEPHALEXIN (sef a LEX in) is a cephalosporin antibiotic. It is used to treat certain kinds of bacterial infections It will not work for colds, flu, or other viral infections. This medicine may be used for other purposes; ask your health care provider or pharmacist if you have questions. COMMON BRAND NAME(S): Biocef, Daxbia, Keflex, Keftab What should I tell my health care provider before I take this medicine? They need to know if you have any of these conditions: -kidney disease -stomach or intestine problems, especially colitis -an unusual or allergic reaction to cephalexin, other cephalosporins, penicillins, other antibiotics, medicines, foods, dyes or preservatives -pregnant or trying to get pregnant -breast-feeding How should I use this medicine? Take this medicine by mouth with a full glass of water. Follow the directions on the prescription label. This medicine can be taken with or without food. Take your medicine at regular intervals. Do not take your medicine more often than directed. Take all of your medicine as directed even if you think you are better. Do not skip doses or stop your medicine early. Talk to your pediatrician regarding the use of this medicine in children. While this drug may be prescribed for selected conditions, precautions do apply. Overdosage: If you think you have taken too much of this medicine contact a poison control center or emergency room at once. NOTE: This medicine is only for you. Do not share this medicine with others. What if I miss a dose? If you miss a dose, take it as soon as you can. If it is almost time for your next dose, take only that dose. Do not take double or extra doses. There should be at least 4 to 6 hours between doses. What may interact with this medicine? -probenecid -some other antibiotics This list may not describe all possible interactions. Give your health care provider a list of  all the medicines, herbs, non-prescription drugs, or dietary supplements you use. Also tell them if you smoke, drink alcohol, or use illegal drugs. Some items may interact with your medicine. What should I watch for while using this medicine? Tell your doctor or health care professional if your symptoms do not begin to improve in a few days. Do not treat diarrhea with over the counter products. Contact your doctor if you have diarrhea that lasts more than 2 days or if it is severe and watery. If you have diabetes, you may get a false-positive result for sugar in your urine. Check with your doctor or health care professional. What side effects may I notice from receiving this medicine? Side effects that you should report to your doctor or health care professional as soon as possible: -allergic reactions like skin rash, itching or hives, swelling of the face, lips, or tongue -breathing problems -pain or trouble passing urine -redness, blistering, peeling or loosening of the skin, including inside the mouth -severe or watery diarrhea -unusually weak or tired -yellowing of the eyes, skin Side effects that usually do not require medical attention (report to your doctor or health care professional if they continue or are bothersome): -gas or heartburn -genital or anal irritation -headache -joint or muscle pain -nausea, vomiting This list may not describe all possible side effects. Call your doctor for medical advice about side effects. You may report side effects to FDA at 1-800-FDA-1088. Where should I keep my medicine? Keep out of the reach of children. Store at room temperature between 59 and 86 degrees F (15 and  30 degrees C). Throw away any unused medicine after the expiration date. NOTE: This sheet is a summary. It may not cover all possible information. If you have questions about this medicine, talk to your doctor, pharmacist, or health care provider.  2017 Elsevier/Gold Standard  (2008-02-06 17:09:13)

## 2017-01-11 ENCOUNTER — Ambulatory Visit (INDEPENDENT_AMBULATORY_CARE_PROVIDER_SITE_OTHER): Payer: BC Managed Care – PPO | Admitting: Physician Assistant

## 2017-01-11 ENCOUNTER — Encounter: Payer: Self-pay | Admitting: Physician Assistant

## 2017-01-11 VITALS — BP 112/70 | HR 78 | Temp 98.7°F | Resp 16 | Wt 175.0 lb

## 2017-01-11 DIAGNOSIS — S60949A Unspecified superficial injury of unspecified finger, initial encounter: Secondary | ICD-10-CM | POA: Diagnosis not present

## 2017-01-11 DIAGNOSIS — L089 Local infection of the skin and subcutaneous tissue, unspecified: Secondary | ICD-10-CM

## 2017-01-11 MED ORDER — CEPHALEXIN 500 MG PO CAPS: 500.0000 mg | ORAL_CAPSULE | Freq: Two times a day (BID) | ORAL | 0 refills | Status: DC

## 2017-01-11 NOTE — Progress Notes (Signed)
       Patient: Allison [name] Female    DOB: Apr 08, 1951   66 y.o.   MRN: [id number] Visit Date: 01/11/2017  Today's Provider: Mar Daring, PA-C   Chief Complaint  Patient presents with  . Hand Problem   Subjective:    HPI Patient comes in today complaining of right middle finer swelling due to foreign body. Patient reports she was working on her garden during the weekend. Patient reports she was wearing gloves while trimming her roses. Patient denies any discharge or fever.     Allergies  Allergen Reactions  . Strawberry Extract     difficulty breathing  . Tetracycline Rash     Current Outpatient Prescriptions:  .  B COMPLEX VITAMINS PO, Take by mouth., Disp: , Rfl:  .  Calcium Carb-Cholecalciferol (CALCIUM + D3) 600-200 MG-UNIT TABS, Take by mouth., Disp: , Rfl:  .  cephALEXin (KEFLEX) 500 MG capsule, Take 1 capsule (500 mg total) by mouth 2 (two) times daily., Disp: 10 capsule, Rfl: 0 .  Cholecalciferol (VITAMIN D) 2000 UNITS tablet, Take by mouth., Disp: , Rfl:  .  ferrous sulfate 325 (65 FE) MG tablet, Take 325 mg by mouth daily with breakfast., Disp: , Rfl:  .  FINACEA 15 % FOAM, APPLY A SMALL AMOUNT ON THE SKIN DAILY, Disp: , Rfl: 11 .  meloxicam (MOBIC) 15 MG tablet, Take 1 tablet (15 mg total) by mouth daily. (Patient not taking: Reported on 11/03/2016), Disp: 30 tablet, Rfl: 3 .  RHOFADE 1 % CREA, APPLY TO FACE QAM, Disp: , Rfl: 11  Review of Systems  Constitutional: Negative.   Respiratory: Negative.   Cardiovascular: Negative.   Musculoskeletal: Positive for arthralgias (right 3rd finger) and joint swelling (R 3rd finger).  Skin: Positive for wound (R 3rd finger).  Neurological: Negative for weakness and numbness.    Social History  Substance Use Topics  . Smoking status: Never Smoker  . Smokeless tobacco: Never Used  . Alcohol use Yes     Comment: 2 glasses wine daily   Objective:   There were no vitals taken for this visit.  Physical Exam    Constitutional: She appears well-developed and well-nourished. No distress.  HENT:  Head: Normocephalic and atraumatic.  Cardiovascular: Normal rate, regular rhythm and normal heart sounds.   No murmur heard. Pulmonary/Chest: Effort normal and breath sounds normal. No respiratory distress. She has no wheezes. She has no rales.  Musculoskeletal:       Right hand: She exhibits tenderness and swelling. She exhibits normal capillary refill and no deformity. Normal sensation noted. Normal strength noted.       Hands: Skin: She is not diaphoretic.  Vitals reviewed.      Assessment & Plan:     1. Superficial injury of finger, infected, initial encounter Superficial infection noted around the two small insertion areas noted on the 3rd finger of the right hand. Will give Keflex as below. Also advised to soak in ice water baths for 15 minutes at a time. If the one area where there is a small thorn remains after the infection is gone, she is to call the office and we will remove. - cephALEXin (KEFLEX) 500 MG capsule; Take 1 capsule (500 mg total) by mouth 2 (two) times daily.  Dispense: 14 capsule; Refill: 0       Mar Daring, PA-C  [facility]

## 2017-01-11 NOTE — Patient Instructions (Signed)
Cephalexin tablets or capsules What is this medicine? CEPHALEXIN (sef a LEX in) is a cephalosporin antibiotic. It is used to treat certain kinds of bacterial infections It will not work for colds, flu, or other viral infections. This medicine may be used for other purposes; ask your health care provider or pharmacist if you have questions. COMMON BRAND NAME(S): Biocef, Daxbia, Keflex, Keftab What should I tell my health care provider before I take this medicine? They need to know if you have any of these conditions: -kidney disease -stomach or intestine problems, especially colitis -an unusual or allergic reaction to cephalexin, other cephalosporins, penicillins, other antibiotics, medicines, foods, dyes or preservatives -pregnant or trying to get pregnant -breast-feeding How should I use this medicine? Take this medicine by mouth with a full glass of water. Follow the directions on the prescription label. This medicine can be taken with or without food. Take your medicine at regular intervals. Do not take your medicine more often than directed. Take all of your medicine as directed even if you think you are better. Do not skip doses or stop your medicine early. Talk to your pediatrician regarding the use of this medicine in children. While this drug may be prescribed for selected conditions, precautions do apply. Overdosage: If you think you have taken too much of this medicine contact a poison control center or emergency room at once. NOTE: This medicine is only for you. Do not share this medicine with others. What if I miss a dose? If you miss a dose, take it as soon as you can. If it is almost time for your next dose, take only that dose. Do not take double or extra doses. There should be at least 4 to 6 hours between doses. What may interact with this medicine? -probenecid -some other antibiotics This list may not describe all possible interactions. Give your health care provider a list of  all the medicines, herbs, non-prescription drugs, or dietary supplements you use. Also tell them if you smoke, drink alcohol, or use illegal drugs. Some items may interact with your medicine. What should I watch for while using this medicine? Tell your doctor or health care professional if your symptoms do not begin to improve in a few days. Do not treat diarrhea with over the counter products. Contact your doctor if you have diarrhea that lasts more than 2 days or if it is severe and watery. If you have diabetes, you may get a false-positive result for sugar in your urine. Check with your doctor or health care professional. What side effects may I notice from receiving this medicine? Side effects that you should report to your doctor or health care professional as soon as possible: -allergic reactions like skin rash, itching or hives, swelling of the face, lips, or tongue -breathing problems -pain or trouble passing urine -redness, blistering, peeling or loosening of the skin, including inside the mouth -severe or watery diarrhea -unusually weak or tired -yellowing of the eyes, skin Side effects that usually do not require medical attention (report to your doctor or health care professional if they continue or are bothersome): -gas or heartburn -genital or anal irritation -headache -joint or muscle pain -nausea, vomiting This list may not describe all possible side effects. Call your doctor for medical advice about side effects. You may report side effects to FDA at 1-800-FDA-1088. Where should I keep my medicine? Keep out of the reach of children. Store at room temperature between 59 and 86 degrees F (15 and  30 degrees C). Throw away any unused medicine after the expiration date. NOTE: This sheet is a summary. It may not cover all possible information. If you have questions about this medicine, talk to your doctor, pharmacist, or health care provider.  2017 Elsevier/Gold Standard  (2008-02-06 17:09:13)

## 2017-06-03 ENCOUNTER — Ambulatory Visit (INDEPENDENT_AMBULATORY_CARE_PROVIDER_SITE_OTHER): Payer: Medicare Other | Admitting: Physician Assistant

## 2017-06-03 ENCOUNTER — Encounter: Payer: Self-pay | Admitting: Physician Assistant

## 2017-06-03 VITALS — BP 120/80 | HR 82 | Temp 98.2°F | Resp 16 | Wt 180.0 lb

## 2017-06-03 DIAGNOSIS — J0140 Acute pansinusitis, unspecified: Secondary | ICD-10-CM | POA: Diagnosis not present

## 2017-06-03 DIAGNOSIS — R05 Cough: Secondary | ICD-10-CM | POA: Diagnosis not present

## 2017-06-03 DIAGNOSIS — R059 Cough, unspecified: Secondary | ICD-10-CM

## 2017-06-03 MED ORDER — AMOXICILLIN-POT CLAVULANATE 875-125 MG PO TABS: 1.0000 | ORAL_TABLET | Freq: Two times a day (BID) | ORAL | 0 refills | Status: DC

## 2017-06-03 MED ORDER — BENZONATATE 200 MG PO CAPS: 200.0000 mg | ORAL_CAPSULE | Freq: Two times a day (BID) | ORAL | 0 refills | Status: DC | PRN

## 2017-06-03 NOTE — Patient Instructions (Signed)

## 2017-06-03 NOTE — Progress Notes (Signed)
Patient: Allison [name] Female    DOB: [birth year]   66 y.o.   MRN: [id number] Visit Date: 06/03/2017  Today's Provider: Mar Daring, PA-C   Chief Complaint  Patient presents with  . URI   Subjective:    HPI Upper Respiratory Infection: Patient complains of symptoms of a URI, possible sinusitis. Symptoms include congestion and cough. Onset of symptoms was 4 days ago, gradually worsening since that time. She also c/o facial pain and post nasal drip for the past 1 day .  She is drinking plenty of fluids. Evaluation to date: none. Treatment to date: cough suppressants and decongestants.     Allergies  Allergen Reactions  . Strawberry Extract     difficulty breathing  . Tetracycline Rash     Current Outpatient Prescriptions:  .  B COMPLEX VITAMINS PO, Take by mouth., Disp: , Rfl:  .  Calcium Carb-Cholecalciferol (CALCIUM + D3) 600-200 MG-UNIT TABS, Take by mouth., Disp: , Rfl:  .  cephALEXin (KEFLEX) 500 MG capsule, Take 1 capsule (500 mg total) by mouth 2 (two) times daily., Disp: 14 capsule, Rfl: 0 .  Cholecalciferol (VITAMIN D) 2000 UNITS tablet, Take by mouth., Disp: , Rfl:  .  ferrous sulfate 325 (65 FE) MG tablet, Take 325 mg by mouth daily with breakfast., Disp: , Rfl:  .  FINACEA 15 % FOAM, APPLY A SMALL AMOUNT ON THE SKIN DAILY, Disp: , Rfl: 11 .  meloxicam (MOBIC) 15 MG tablet, Take 1 tablet (15 mg total) by mouth daily., Disp: 30 tablet, Rfl: 3 .  RHOFADE 1 % CREA, APPLY TO FACE QAM, Disp: , Rfl: 11  Review of Systems  Constitutional: Negative.   HENT: Positive for congestion, postnasal drip, sinus pain, sinus pressure and sore throat. Negative for ear pain, rhinorrhea and trouble swallowing.   Respiratory: Positive for cough. Negative for chest tightness, shortness of breath and wheezing.   Cardiovascular: Negative.   Gastrointestinal: Negative for abdominal pain and nausea.  Neurological: Positive for headaches. Negative for dizziness.    Social  History  Substance Use Topics  . Smoking status: Never Smoker  . Smokeless tobacco: Never Used  . Alcohol use Yes     Comment: 2 glasses wine daily   Objective:   BP 120/80 (BP Location: Left Arm, Patient Position: Sitting, Cuff Size: Large)   Pulse 82   Temp 98.2 F (36.8 C) (Oral)   Resp 16   Wt 180 lb (81.6 kg)   SpO2 98%   BMI 26.58 kg/m  Vitals:   06/03/17 1140  BP: 120/80  Pulse: 82  Resp: 16  Temp: 98.2 F (36.8 C)  TempSrc: Oral  SpO2: 98%  Weight: 180 lb (81.6 kg)     Physical Exam  Constitutional: She appears well-developed and well-nourished. No distress.  HENT:  Head: Normocephalic and atraumatic.  Right Ear: Hearing, tympanic membrane, external ear and ear canal normal.  Left Ear: Hearing, tympanic membrane, external ear and ear canal normal.  Nose: Right sinus exhibits maxillary sinus tenderness and frontal sinus tenderness. Left sinus exhibits maxillary sinus tenderness and frontal sinus tenderness.  Mouth/Throat: Uvula is midline, oropharynx is clear and moist and mucous membranes are normal. No oropharyngeal exudate.  Neck: Normal range of motion. Neck supple. No tracheal deviation present. No thyromegaly present.  Cardiovascular: Normal rate, regular rhythm and normal heart sounds.  Exam reveals no gallop and no friction rub.   No murmur heard. Pulmonary/Chest: Effort normal and breath sounds  normal. No stridor. No respiratory distress. She has no wheezes. She has no rales.  Lymphadenopathy:    She has no cervical adenopathy.  Skin: She is not diaphoretic.  Vitals reviewed.       Assessment & Plan:     1. Acute pansinusitis, recurrence not specified Worsening symptoms that have not responded to OTC medications. Will give augmentin as below. Continue allergy medications. Stay well hydrated and get plenty of rest. Call if no symptom improvement or if symptoms worsen. - amoxicillin-clavulanate (AUGMENTIN) 875-125 MG tablet; Take 1 tablet by mouth 2  (two) times daily.  Dispense: 20 tablet; Refill: 0 - benzonatate (TESSALON) 200 MG capsule; Take 1 capsule (200 mg total) by mouth 2 (two) times daily as needed for cough.  Dispense: 30 capsule; Refill: 0  2. Cough Tessalon perles given for cough. Call if no improvement. - benzonatate (TESSALON) 200 MG capsule; Take 1 capsule (200 mg total) by mouth 2 (two) times daily as needed for cough.  Dispense: 30 capsule; Refill: 0       Mar Daring, PA-C  [facility]

## 2017-06-29 ENCOUNTER — Encounter: Payer: Self-pay | Admitting: *Deleted

## 2017-07-06 ENCOUNTER — Encounter
Admission: RE | Disposition: A | Discharge status: Home / Self Care | Payer: Self-pay | Source: Ambulatory Visit | Attending: Ophthalmology

## 2017-07-06 ENCOUNTER — Ambulatory Visit
Admission: RE | Admit: 2017-07-06 | Discharge: 2017-07-06 | Disposition: A | Discharge status: Home / Self Care | Payer: Medicare Other | Source: Ambulatory Visit | Attending: Ophthalmology | Admitting: Ophthalmology

## 2017-07-06 ENCOUNTER — Ambulatory Visit: Payer: Medicare Other | Admitting: Anesthesiology

## 2017-07-06 ENCOUNTER — Encounter: Payer: Self-pay | Admitting: *Deleted

## 2017-07-06 DIAGNOSIS — H2511 Age-related nuclear cataract, right eye: Secondary | ICD-10-CM | POA: Insufficient documentation

## 2017-07-06 HISTORY — PX: CATARACT EXTRACTION W/PHACO: SHX586

## 2017-07-06 HISTORY — DX: Rosacea, unspecified: L71.9

## 2017-07-06 HISTORY — DX: Other specified health status: Z78.9

## 2017-07-06 SURGERY — PHACOEMULSIFICATION, CATARACT, WITH IOL INSERTION
Anesthesia: Monitor Anesthesia Care | Site: Eye | Laterality: Right | Wound class: Clean

## 2017-07-06 MED ORDER — CARBACHOL 0.01 % IO SOLN
INTRAOCULAR | Status: DC | PRN
  Administered 2017-07-06: 0.5 mL via INTRAOCULAR

## 2017-07-06 MED ORDER — NA CHONDROIT SULF-NA HYALURON 40-17 MG/ML IO SOLN
INTRAOCULAR | Status: DC | PRN
  Administered 2017-07-06: 1 mL via INTRAOCULAR

## 2017-07-06 MED ORDER — LIDOCAINE HCL (PF) 4 % IJ SOLN
INTRAMUSCULAR | Status: AC
  Filled 2017-07-06: qty 5

## 2017-07-06 MED ORDER — SODIUM CHLORIDE 0.9 % IV SOLN
INTRAVENOUS | Status: DC
  Administered 2017-07-06: 08:00:00 via INTRAVENOUS

## 2017-07-06 MED ORDER — POVIDONE-IODINE 5 % OP SOLN
OPHTHALMIC | Status: AC
  Filled 2017-07-06: qty 30

## 2017-07-06 MED ORDER — EPINEPHRINE PF 1 MG/ML IJ SOLN
INTRAOCULAR | Status: DC | PRN
  Administered 2017-07-06: 09:00:00 via OPHTHALMIC

## 2017-07-06 MED ORDER — EPINEPHRINE PF 1 MG/ML IJ SOLN
INTRAMUSCULAR | Status: AC
  Filled 2017-07-06: qty 2

## 2017-07-06 MED ORDER — ARMC OPHTHALMIC DILATING DROPS
1.0000 "application " | OPHTHALMIC | Status: AC
  Administered 2017-07-06 (×3): 1 via OPHTHALMIC

## 2017-07-06 MED ORDER — ARMC OPHTHALMIC DILATING DROPS
OPHTHALMIC | Status: AC
  Administered 2017-07-06: 1 via OPHTHALMIC
  Filled 2017-07-06: qty 0.4

## 2017-07-06 MED ORDER — NA CHONDROIT SULF-NA HYALURON 40-17 MG/ML IO SOLN
INTRAOCULAR | Status: AC
  Filled 2017-07-06: qty 1

## 2017-07-06 MED ORDER — FENTANYL CITRATE (PF) 100 MCG/2ML IJ SOLN
INTRAMUSCULAR | Status: AC
  Filled 2017-07-06: qty 2

## 2017-07-06 MED ORDER — MIDAZOLAM HCL 2 MG/2ML IJ SOLN
INTRAMUSCULAR | Status: DC | PRN
  Administered 2017-07-06: 1 mg via INTRAVENOUS

## 2017-07-06 MED ORDER — MOXIFLOXACIN HCL 0.5 % OP SOLN: 1.0000 [drp] | OPHTHALMIC | Status: DC | PRN

## 2017-07-06 MED ORDER — MIDAZOLAM HCL 2 MG/2ML IJ SOLN
INTRAMUSCULAR | Status: AC
  Filled 2017-07-06: qty 2

## 2017-07-06 MED ORDER — MOXIFLOXACIN HCL 0.5 % OP SOLN
OPHTHALMIC | Status: DC | PRN
  Administered 2017-07-06: 0.2 mL via OPHTHALMIC

## 2017-07-06 MED ORDER — CEFUROXIME OPHTHALMIC INJECTION 1 MG/0.1 ML
INJECTION | OPHTHALMIC | Status: DC | PRN
  Administered 2017-07-06: 1 mg via INTRACAMERAL

## 2017-07-06 MED ORDER — LIDOCAINE HCL (PF) 4 % IJ SOLN
INTRAMUSCULAR | Status: DC | PRN
  Administered 2017-07-06: 4 mL via OPHTHALMIC

## 2017-07-06 MED ORDER — FENTANYL CITRATE (PF) 100 MCG/2ML IJ SOLN
INTRAMUSCULAR | Status: DC | PRN
  Administered 2017-07-06: 25 ug via INTRAVENOUS

## 2017-07-06 MED ORDER — POVIDONE-IODINE 5 % OP SOLN
OPHTHALMIC | Status: DC | PRN
  Administered 2017-07-06: 1 via OPHTHALMIC

## 2017-07-06 MED ORDER — MOXIFLOXACIN HCL 0.5 % OP SOLN
OPHTHALMIC | Status: AC
  Filled 2017-07-06: qty 3

## 2017-07-06 SURGICAL SUPPLY — 16 items
GLOVE BIO SURGEON STRL SZ8 (GLOVE) ×2 IMPLANT
GLOVE BIOGEL M 6.5 STRL (GLOVE) ×2 IMPLANT
GLOVE SURG LX 8.0 MICRO (GLOVE) ×1
GLOVE SURG LX STRL 8.0 MICRO (GLOVE) ×1 IMPLANT
GOWN STRL REUS W/ TWL LRG LVL3 (GOWN DISPOSABLE) ×2 IMPLANT
GOWN STRL REUS W/TWL LRG LVL3 (GOWN DISPOSABLE) ×2
LABEL CATARACT MEDS ST (LABEL) ×2 IMPLANT
LENS IOL TECNIS ITEC 21.5 (Intraocular Lens) ×2 IMPLANT
PACK CATARACT (MISCELLANEOUS) ×2 IMPLANT
PACK CATARACT BRASINGTON LX (MISCELLANEOUS) ×2 IMPLANT
PACK EYE AFTER SURG (MISCELLANEOUS) ×2 IMPLANT
SOL BSS BAG (MISCELLANEOUS) ×2
SOLUTION BSS BAG (MISCELLANEOUS) ×1 IMPLANT
SYR 5ML LL (SYRINGE) ×2 IMPLANT
WATER STERILE IRR 250ML POUR (IV SOLUTION) ×2 IMPLANT
WIPE NON LINTING 3.25X3.25 (MISCELLANEOUS) ×2 IMPLANT

## 2017-07-06 NOTE — H&P (Signed)
All labs reviewed. Abnormal studies sent to patients PCP when indicated.  Previous H&P reviewed, patient examined, there are NO CHANGES.  Allison [name] LOUIS8/21/20188:59 AM

## 2017-07-06 NOTE — Discharge Instructions (Signed)
Eye Surgery Discharge Instructions  Expect mild scratchy sensation or mild soreness. DO NOT RUB YOUR EYE!  The day of surgery:  Minimal physical activity, but bed rest is not required  No reading, computer work, or close hand work  No bending, lifting, or straining.  May watch TV  For 24 hours:  No driving, legal decisions, or alcoholic beverages  Safety precautions  Eat anything you prefer: It is better to start with liquids, then soup then solid foods.  _____ Eye patch should be worn until postoperative exam tomorrow.  ____ Solar shield eyeglasses should be worn for comfort in the sunlight/patch while sleeping  Resume all regular medications including aspirin or Coumadin if these were discontinued prior to surgery. You may shower, bathe, shave, or wash your hair. Tylenol may be taken for mild discomfort.  Call your doctor if you experience significant pain, nausea, or vomiting, fever > 101 or other signs of infection. [phone] or [phone] Specific instructions:  Follow-up Information    Birder Robson, MD Follow up.   Specialty:  Ophthalmology Why:  August 22 at 9:10am Contact information: [address] [place] [address] [phone]

## 2017-07-06 NOTE — Op Note (Signed)
PREOPERATIVE DIAGNOSIS:  Nuclear sclerotic cataract of the right eye.   POSTOPERATIVE DIAGNOSIS:  NUCLEAR SCLEROTIC CATARACT RIGHT EYE   OPERATIVE PROCEDURE: Procedure(s): CATARACT EXTRACTION PHACO AND INTRAOCULAR LENS PLACEMENT (IOC)   SURGEON:  Birder Robson, MD.   ANESTHESIA:  Anesthesiologist: Alvin Critchley, MD CRNA: Darlyne Russian, CRNA  1.      Managed anesthesia care. 2.      0.33ml of Shugarcaine was instilled in the eye following the paracentesis.   COMPLICATIONS:  None.   TECHNIQUE:   Stop and chop   DESCRIPTION OF PROCEDURE:  The patient was examined and consented in the preoperative holding area where the aforementioned topical anesthesia was applied to the right eye and then brought back to the Operating Room where the right eye was prepped and draped in the usual sterile ophthalmic fashion and a lid speculum was placed. A paracentesis was created with the side port blade and the anterior chamber was filled with viscoelastic. A near clear corneal incision was performed with the steel keratome. A continuous curvilinear capsulorrhexis was performed with a cystotome followed by the capsulorrhexis forceps. Hydrodissection and hydrodelineation were carried out with BSS on a blunt cannula. The lens was removed in a stop and chop  technique and the remaining cortical material was removed with the irrigation-aspiration handpiece. The capsular bag was inflated with viscoelastic and the Technis [id number]  lens was placed in the capsular bag without complication. The remaining viscoelastic was removed from the eye with the irrigation-aspiration handpiece. The wounds were hydrated. The anterior chamber was flushed with Miostat and the eye was inflated to physiologic pressure. 0.34ml of Vigamox was placed in the anterior chamber. The wounds were found to be water tight. The eye was dressed with Vigamox. The patient was given protective glasses to wear throughout the day and a shield with which to  sleep tonight. The patient was also given drops with which to begin a drop regimen today and will follow-up with me in one day.  Implant Name Type Inv. Item Serial No. Manufacturer Lot No. LRB No. Used  LENS IOL DIOP 21.5 - [id number] Intraocular Lens LENS IOL DIOP 21.5 [id number] AMO   Right 1   Procedure(s) with comments: CATARACT EXTRACTION PHACO AND INTRAOCULAR LENS PLACEMENT (IOC) (Right) - [place] 00:34.2 AP% 12.7 CDE 4.33 Fluid Pack lot # [id number]  Electronically signed: [place] 07/06/2017 9:20 AM

## 2017-07-06 NOTE — Anesthesia Preprocedure Evaluation (Signed)
Anesthesia Evaluation  Patient identified by MRN, date of birth, ID band Patient awake    Reviewed: Allergy & Precautions, NPO status , Patient's Chart, lab work & pertinent test results  Airway Mallampati: II  TM Distance: >3 FB     Dental no notable dental hx.    Pulmonary neg pulmonary ROS,    Pulmonary exam normal        Cardiovascular negative cardio ROS Normal cardiovascular exam     Neuro/Psych negative neurological ROS  negative psych ROS   GI/Hepatic negative GI ROS, Neg liver ROS,   Endo/Other  Hyperthyroidism   Renal/GU negative Renal ROS  negative genitourinary   Musculoskeletal negative musculoskeletal ROS (+)   Abdominal Normal abdominal exam  (+)   Peds negative pediatric ROS (+)  Hematology negative hematology ROS (+)   Anesthesia Other Findings Past Medical History: No date: Medical history non-contributory No date: Rosacea  Reproductive/Obstetrics                             Anesthesia Physical Anesthesia Plan  ASA: II  Anesthesia Plan: MAC   Post-op Pain Management:    Induction: Intravenous  PONV Risk Score and Plan:   Airway Management Planned: Nasal Cannula  Additional Equipment:   Intra-op Plan:   Post-operative Plan:   Informed Consent: I have reviewed the patients History and Physical, chart, labs and discussed the procedure including the risks, benefits and alternatives for the proposed anesthesia with the patient or authorized representative who has indicated his/her understanding and acceptance.   Dental advisory given  Plan Discussed with: CRNA and Surgeon  Anesthesia Plan Comments:         Anesthesia Quick Evaluation

## 2017-07-06 NOTE — Anesthesia Procedure Notes (Signed)
Procedure Name: MAC Date/Time: 07/06/2017 9:03 AM Performed by: Darlyne Russian Pre-anesthesia Checklist: Patient identified, Emergency Drugs available, Suction available, Patient being monitored and Timeout performed Patient Re-evaluated:Patient Re-evaluated prior to induction Oxygen Delivery Method: Nasal cannula Placement Confirmation: positive ETCO2

## 2017-07-06 NOTE — Transfer of Care (Signed)
Immediate Anesthesia Transfer of Care Note  Patient: Allison [name]  Procedure(s) Performed: Procedure(s) with comments: CATARACT EXTRACTION PHACO AND INTRAOCULAR LENS PLACEMENT (IOC) (Right) - [place] 00:34.2 AP% 12.7 CDE 4.33 Fluid Pack lot # [id number]  Patient Location: PACU  Anesthesia Type:MAC  Level of Consciousness: awake, alert  and oriented  Airway & Oxygen Therapy: Patient Spontanous Breathing  Post-op Assessment: Report given to RN and Post -op Vital signs reviewed and stable  Post vital signs: Reviewed and stable  Last Vitals:  Vitals:   07/06/17 0756 07/06/17 0921  BP: 121/74 110/64  Pulse: 65 (!) 56  Resp: 16 16  Temp: 36.8 C   SpO2: 99% 98%    Last Pain:  Vitals:   07/06/17 0921  TempSrc: Temporal         Complications: No apparent anesthesia complications

## 2017-07-06 NOTE — Anesthesia Post-op Follow-up Note (Signed)
Anesthesia QCDR form completed.        

## 2017-07-06 NOTE — Anesthesia Postprocedure Evaluation (Signed)
Anesthesia Post Note  Patient: Mariem Skolnick Doss  Procedure(s) Performed: Procedure(s) (LRB): CATARACT EXTRACTION PHACO AND INTRAOCULAR LENS PLACEMENT (IOC) (Right)  Patient location during evaluation: PACU Anesthesia Type: MAC Level of consciousness: awake and alert Pain management: pain level controlled Vital Signs Assessment: post-procedure vital signs reviewed and stable Respiratory status: spontaneous breathing, nonlabored ventilation, respiratory function stable and patient connected to nasal cannula oxygen Cardiovascular status: stable and blood pressure returned to baseline Anesthetic complications: no     Last Vitals:  Vitals:   07/06/17 0756 07/06/17 0921  BP: 121/74 110/64  Pulse: 65 (!) 56  Resp: 16 16  Temp: 36.8 C   SpO2: 99% 98%    Last Pain:  Vitals:   07/06/17 0921  TempSrc: Temporal                 Darlyne Russian

## 2017-08-16 ENCOUNTER — Encounter: Payer: BC Managed Care – PPO | Admitting: Physician Assistant

## 2017-08-19 ENCOUNTER — Encounter: Payer: BC Managed Care – PPO | Admitting: Physician Assistant

## 2017-08-23 ENCOUNTER — Encounter: Payer: Self-pay | Admitting: Physician Assistant

## 2017-08-23 ENCOUNTER — Ambulatory Visit (INDEPENDENT_AMBULATORY_CARE_PROVIDER_SITE_OTHER): Payer: Medicare Other | Admitting: Physician Assistant

## 2017-08-23 VITALS — BP 122/70 | HR 86 | Temp 97.8°F | Resp 16 | Ht 70.0 in | Wt 184.6 lb

## 2017-08-23 DIAGNOSIS — Z1239 Encounter for other screening for malignant neoplasm of breast: Secondary | ICD-10-CM

## 2017-08-23 DIAGNOSIS — Z13820 Encounter for screening for osteoporosis: Secondary | ICD-10-CM | POA: Diagnosis not present

## 2017-08-23 DIAGNOSIS — Z23 Encounter for immunization: Secondary | ICD-10-CM

## 2017-08-23 DIAGNOSIS — Z1231 Encounter for screening mammogram for malignant neoplasm of breast: Secondary | ICD-10-CM | POA: Diagnosis not present

## 2017-08-23 DIAGNOSIS — Z8639 Personal history of other endocrine, nutritional and metabolic disease: Secondary | ICD-10-CM

## 2017-08-23 DIAGNOSIS — Z0000 Encounter for general adult medical examination without abnormal findings: Secondary | ICD-10-CM

## 2017-08-23 DIAGNOSIS — Z780 Asymptomatic menopausal state: Secondary | ICD-10-CM

## 2017-08-23 DIAGNOSIS — Z6826 Body mass index (BMI) 26.0-26.9, adult: Secondary | ICD-10-CM

## 2017-08-23 DIAGNOSIS — M5432 Sciatica, left side: Secondary | ICD-10-CM | POA: Diagnosis not present

## 2017-08-23 NOTE — Progress Notes (Signed)
Patient: Allison [name], Female    DOB: [birth year], 66 y.o.   MRN: [id number] Visit Date: 08/23/2017  Today's Provider: Mar Daring, PA-C   Chief Complaint  Patient presents with  . Medicare Wellness   Subjective:    Annual wellness visit Allison [name] is a 66 y.o. female. She feels well. She reports exercising. She reports she is sleeping well. She is here for initial medicare wellness.   Only complaint is nocturnal leg cramping. Not every night but will wake her up.   She would like to get her Flu vaccine, Prevnar 13 and Shingrix vaccine today.   Colonoscopy: 02/20/2008-Normal Mammogram: 09/10/2016; BiRads:1 Pap: 08/08/2014; Normal and HPV Negative -----------------------------------------------------------   Review of Systems  Constitutional: Positive for fatigue.  HENT: Negative.   Eyes: Negative.   Respiratory: Negative.   Cardiovascular: Negative.   Gastrointestinal: Negative.   Endocrine: Negative.   Genitourinary: Negative.   Musculoskeletal: Positive for arthralgias (Legs aching).  Skin: Negative.   Allergic/Immunologic: Negative.   Neurological: Negative.   Hematological: Negative.   Psychiatric/Behavioral: Negative.     Social History   Social History  . Marital status: Married    Spouse name: N/A  . Number of children: N/A  . Years of education: N/A   Occupational History  . Not on file.   Social History Main Topics  . Smoking status: Never Smoker  . Smokeless tobacco: Never Used  . Alcohol use Yes     Comment: 2 glasses wine daily  . Drug use: No  . Sexual activity: Not on file   Other Topics Concern  . Not on file   Social History Narrative  . No narrative on file    Past Medical History:  Diagnosis Date  . Medical history non-contributory   . Rosacea      Patient Active Problem List   Diagnosis Date Noted  . Blood in the urine 08/13/2015  . H/O hyperthyroidism 08/13/2015  . H/O disease 08/13/2015  . H/O  amblyopia 08/13/2015    Past Surgical History:  Procedure Laterality Date  . BREAST BIOPSY Left 2002   benign  . CATARACT EXTRACTION W/PHACO Right 07/06/2017   Procedure: CATARACT EXTRACTION PHACO AND INTRAOCULAR LENS PLACEMENT (IOC);  Surgeon: Birder Robson, MD;  Location: [facility] ORS;  Service: Ophthalmology;  Laterality: Right;  [place] 00:34.2 AP% 12.7 CDE 4.33 Fluid Pack lot # [id number]  . NO PAST SURGERIES      Her family history includes Bladder Cancer in her mother; Breast cancer in her mother; Breast cancer (age of onset: 12) in her sister; Diverticulosis in her brother; Hyperlipidemia in her brother and father; Kidney Stones in her brother and brother; Stroke in her father.      Current Outpatient Prescriptions:  .  B COMPLEX VITAMINS PO, Take by mouth., Disp: , Rfl:  .  Calcium Carb-Cholecalciferol (CALCIUM + D3) 600-200 MG-UNIT TABS, Take by mouth., Disp: , Rfl:  .  Cholecalciferol (VITAMIN D) 2000 UNITS tablet, Take by mouth., Disp: , Rfl:  .  ferrous sulfate 325 (65 FE) MG tablet, Take 325 mg by mouth daily with breakfast., Disp: , Rfl:  .  FINACEA 15 % FOAM, APPLY A SMALL AMOUNT ON THE SKIN DAILY, Disp: , Rfl: 11 .  RHOFADE 1 % CREA, APPLY TO FACE QAM, Disp: , Rfl: 11 .  amoxicillin-clavulanate (AUGMENTIN) 875-125 MG tablet, Take 1 tablet by mouth 2 (two) times daily. (Patient not taking: Reported on 06/29/2017), Disp: 20  tablet, Rfl: 0 .  benzonatate (TESSALON) 200 MG capsule, Take 1 capsule (200 mg total) by mouth 2 (two) times daily as needed for cough. (Patient not taking: Reported on 07/06/2017), Disp: 30 capsule, Rfl: 0 .  meloxicam (MOBIC) 15 MG tablet, Take 1 tablet (15 mg total) by mouth daily. (Patient not taking: Reported on 07/06/2017), Disp: 30 tablet, Rfl: 3  Patient Care Team: Mar Daring, PA-C as PCP - General (Family Medicine)     Objective:   Vitals: BP 122/70 (BP Location: Left Arm, Patient Position: Sitting, Cuff Size: Large)   Pulse 86   Temp  97.8 F (36.6 C) (Oral)   Resp 16   Ht 5\' 10"  (1.778 m)   Wt 184 lb 9.6 oz (83.7 kg)   BMI 26.49 kg/m   Physical Exam  Constitutional: She is oriented to person, place, and time. She appears well-developed and well-nourished. No distress.  HENT:  Head: Normocephalic and atraumatic.  Right Ear: Hearing, tympanic membrane, external ear and ear canal normal.  Left Ear: Hearing, tympanic membrane, external ear and ear canal normal.  Nose: Nose normal.  Mouth/Throat: Uvula is midline, oropharynx is clear and moist and mucous membranes are normal. No oropharyngeal exudate.  Eyes: Pupils are equal, round, and reactive to light. Conjunctivae and EOM are normal. Right eye exhibits no discharge. Left eye exhibits no discharge. No scleral icterus.  Neck: Normal range of motion. Neck supple. No JVD present. Carotid bruit is not present. No tracheal deviation present. No thyromegaly present.  Cardiovascular: Normal rate, regular rhythm, normal heart sounds and intact distal pulses.  Exam reveals no gallop and no friction rub.   No murmur heard. Pulmonary/Chest: Effort normal and breath sounds normal. No respiratory distress. She has no wheezes. She has no rales. She exhibits no tenderness. Right breast exhibits no inverted nipple, no mass, no nipple discharge, no skin change and no tenderness. Left breast exhibits no inverted nipple, no mass, no nipple discharge, no skin change and no tenderness. Breasts are symmetrical.  Abdominal: Soft. Bowel sounds are normal. She exhibits no distension and no mass. There is no tenderness. There is no rebound and no guarding.  Musculoskeletal: Normal range of motion. She exhibits no edema or tenderness.  Lymphadenopathy:    She has no cervical adenopathy.  Neurological: She is alert and oriented to person, place, and time. She has normal reflexes.  Skin: Skin is warm and dry. No rash noted. She is not diaphoretic.  Psychiatric: She has a normal mood and affect. Her  behavior is normal. Judgment and thought content normal.  Vitals reviewed.   Activities of Daily Living In your present state of health, do you have any difficulty performing the following activities: 08/23/2017  Hearing? N  Vision? N  Difficulty concentrating or making decisions? N  Walking or climbing stairs? N  Dressing or bathing? N  Doing errands, shopping? N  Some recent data might be hidden    Fall Risk Assessment Fall Risk  08/23/2017 06/03/2017 08/14/2015  Falls in the past year? Yes No No     Depression Screen PHQ 2/9 Scores 08/23/2017 08/14/2015  PHQ - 2 Score 0 0    Cognitive Testing - 6-CIT  Correct? Score   What year is it? yes 0 0 or 4  What month is it? yes 0 0 or 3  Memorize:    Pia Mau,  [address],  [place],      What time is it? (within 1  hour) yes 0 0 or 3  Count backwards from 20 yes 0 0, 2, or 4  Name the months of the year yes 0 0, 2, or 4  Repeat name & address above no 3 0, 2, 4, 6, 8, or 10       TOTAL SCORE  03/28   Interpretation:  Normal  Normal (0-7) Abnormal (8-28)       Assessment & Plan:     Annual Wellness Visit  Reviewed patient's Family Medical History Reviewed and updated list of patient's medical providers Assessment of cognitive impairment was done Assessed patient's functional ability Established a written schedule for health screening [place] Completed and Reviewed  Exercise Activities and Dietary recommendations Goals    . Exercise 150 minutes per week (moderate activity)       Immunization History  Administered Date(s) Administered  . Influenza,inj,Quad PF,6+ Mos 08/14/2015, 08/14/2016  . Pneumococcal Polysaccharide-23 08/08/2014  . Td 02/14/2005  . Zoster 12/18/2011    Health Maintenance  Topic Date Due  . HIV Screening  02/14/1967  . TETANUS/TDAP  02/15/2015  . DEXA SCAN  02/13/2017  . PNA vac Low Risk Adult (1 of 2 - PCV13) 02/13/2017  . INFLUENZA VACCINE  06/16/2017  .  COLONOSCOPY  02/19/2018  . PAP SMEAR  08/13/2018  . MAMMOGRAM  09/09/2018  . Hepatitis C Screening  Completed     Discussed health benefits of physical activity, and encouraged her to engage in regular exercise appropriate for her age and condition.    1. Welcome to Medicare preventive visit Normal physical exam today. EKG NSR rate of 76 personally reviewed by me. Vaccinations updated.  - EKG 12-Lead  2. Breast cancer screening Breast exam today was normal. There is no family history of breast cancer. She does perform regular self breast exams. Mammogram was ordered as below. Information for [facility] clinic was given to patient so she may schedule her mammogram at her convenience. - MM Digital Screening; Future  3. Sciatica of left side Occurs occasionally, once per week lasting for one day then improves. Advised to use Naproxen or IBU when flared. Exercises and stretches given to patient in written form.   4. H/O hyperthyroidism Will check labs as below and f/u pending results. - CBC w/Diff/Platelet - COMPLETE METABOLIC PANEL WITH GFR - TSH  5. BMI 26.0-26.9,adult Patient is exercising with personal trainer twice per week. Will check labs as below and f/u pending results. - CBC w/Diff/Platelet - COMPLETE METABOLIC PANEL WITH GFR - Lipid Profile  6. Postmenopausal estrogen deficiency Will check bone density as below. Will check labs as below and f/u pending results. - DG Bone Density; Future - CBC w/Diff/Platelet - COMPLETE METABOLIC PANEL WITH GFR  7. Osteoporosis screening Bone density ordered.  - DG Bone Density; Future  8. Need for influenza vaccination Flu vaccine given today without complication. Patient sat upright for 15 minutes to check for adverse reaction before being released. - Flu vaccine HIGH DOSE PF  9. Need for shingles vaccine Shingrix Vaccine given to patient without complications. Patient sat for 15 minutes after administration and was  tolerated well without adverse effects. - Varicella-zoster vaccine IM  10. Need for pneumococcal vaccination Prevnar 13 given to patient without complications. Patient sat for 15 minutes after administration and was tolerated well without adverse effects. Patient had pneumovax in 2015 so she will not be due for pneumovax until 2020.  - Pneumococcal conjugate vaccine 13-valent IM  ------------------------------------------------------------------------------------------------------------  Mar Daring, PA-C  [facility]

## 2017-08-23 NOTE — Patient Instructions (Signed)
Sciatica Sciatica is pain, numbness, weakness, or tingling along the path of the sciatic nerve. The sciatic nerve starts in the lower back and runs down the back of each leg. The nerve controls the muscles in the lower leg and in the back of the knee. It also provides feeling (sensation) to the back of the thigh, the lower leg, and the sole of the foot. Sciatica is a symptom of another medical condition that pinches or puts pressure on the sciatic nerve. Generally, sciatica only affects one side of the body. Sciatica usually goes away on its own or with treatment. In some cases, sciatica may keep coming back (recur). What are the causes? This condition is caused by pressure on the sciatic nerve, or pinching of the sciatic nerve. This may be the result of:  A disk in between the bones of the spine (vertebrae) bulging out too far (herniated disk).  Age-related changes in the spinal disks (degenerative disk disease).  A pain disorder that affects a muscle in the buttock (piriformis syndrome).  Extra bone growth (bone spur) near the sciatic nerve.  An injury or break (fracture) of the pelvis.  Pregnancy.  Tumor (rare). What increases the risk? The following factors may make you more likely to develop this condition:  Playing sports that place pressure or stress on the spine, such as football or weight lifting.  Having poor strength and flexibility.  A history of back injury.  A history of back surgery.  Sitting for long periods of time.  Doing activities that involve repetitive bending or lifting.  Obesity. What are the signs or symptoms? Symptoms can vary from mild to very severe, and they may include:  Any of these problems in the lower back, leg, hip, or buttock:  Mild tingling or dull aches.  Burning sensations.  Sharp pains.  Numbness in the back of the calf or the sole of the foot.  Leg weakness.  Severe back pain that makes movement difficult. These symptoms may  get worse when you cough, sneeze, or laugh, or when you sit or stand for long periods of time. Being overweight may also make symptoms worse. In some cases, symptoms may recur over time. How is this diagnosed? This condition may be diagnosed based on:  Your symptoms.  A physical exam. Your health care provider may ask you to do certain movements to check whether those movements trigger your symptoms.  You may have tests, including:  Blood tests.  X-rays.  MRI.  CT scan. How is this treated? In many cases, this condition improves on its own, without any treatment. However, treatment may include:  Reducing or modifying physical activity during periods of pain.  Exercising and stretching to strengthen your abdomen and improve the flexibility of your spine.  Icing and applying heat to the affected area.  Medicines that help:  To relieve pain and swelling.  To relax your muscles.  Injections of medicines that help to relieve pain, irritation, and inflammation around the sciatic nerve (steroids).  Surgery. Follow these instructions at home: Medicines   Take over-the-counter and prescription medicines only as told by your health care provider.  Do not drive or operate heavy machinery while taking prescription pain medicine. Managing pain   If directed, apply ice to the affected area.  Put ice in a plastic bag.  Place a towel between your skin and the bag.  Leave the ice on for 20 minutes, 2-3 times a day.  After icing, apply heat to the   heat to the affected area before you exercise or as often as told by your health care provider. Use the heat source that your health care provider recommends, such as a moist heat pack or a heating pad. ? Place a towel between your skin and the heat source. ? Leave the heat on for 20-30 minutes. ? Remove the heat if your skin turns bright red. This is especially important if you are unable to feel pain, heat, or cold. You may have a  greater risk of getting burned. Activity  Return to your normal activities as told by your health care provider. Ask your health care provider what activities are safe for you. ? Avoid activities that make your symptoms worse.  Take brief periods of rest throughout the day. Resting in a lying or standing position is usually better than sitting to rest. ? When you rest for longer periods, mix in some mild activity or stretching between periods of rest. This will help to prevent stiffness and pain. ? Avoid sitting for long periods of time without moving. Get up and move around at least one time each hour.  Exercise and stretch regularly, as told by your health care provider.  Do not lift anything that is heavier than 10 lb (4.5 kg) while you have symptoms of sciatica. When you do not have symptoms, you should still avoid heavy lifting, especially repetitive heavy lifting.  When you lift objects, always use proper lifting technique, which includes: ? Bending your knees. ? Keeping the load close to your body. ? Avoiding twisting. General instructions  Use good posture. ? Avoid leaning forward while sitting. ? Avoid hunching over while standing.  Maintain a healthy weight. Excess weight puts extra stress on your back and makes it difficult to maintain good posture.  Wear supportive, comfortable shoes. Avoid wearing high heels.  Avoid sleeping on a mattress that is too soft or too hard. A mattress that is firm enough to support your back when you sleep may help to reduce your pain.  Keep all follow-up visits as told by your health care provider. This is important. Contact a health care provider if:  You have pain that wakes you up when you are sleeping.  You have pain that gets worse when you lie down.  Your pain is worse than you have experienced in the past.  Your pain lasts longer than 4 weeks.  You experience unexplained weight loss. Get help right away if:  You lose control  of your bowel or bladder (incontinence).  You have: ? Weakness in your lower back, pelvis, buttocks, or legs that gets worse. ? Redness or swelling of your back. ? A burning sensation when you urinate. This information is not intended to replace advice given to you by your health care provider. Make sure you discuss any questions you have with your health care provider. Document Released: 10/27/2001 Document Revised: 04/07/2016 Document Reviewed: 07/12/2015 Elsevier Interactive Patient Education  2017 [place] [employer].   Sciatica Rehab Ask your health care provider which exercises are safe for you. Do exercises exactly as told by your health care provider and adjust them as directed. It is normal to feel mild stretching, pulling, tightness, or discomfort as you do these exercises, but you should stop right away if you feel sudden pain or your pain gets worse.Do not begin these exercises until told by your health care provider. Stretching and range of motion exercises These exercises warm up your muscles and joints and improve the  movement and flexibility of your hips and your back. These exercises also help to relieve pain, numbness, and tingling. Exercise A: Sciatic nerve glide 1. Sit in a chair with your head facing down toward your chest. Place your hands behind your back. Let your shoulders slump forward. 2. Slowly straighten one of your knees while you tilt your head back as if you are looking toward the ceiling. Only straighten your leg as far as you can without making your symptoms worse. 3. Hold for __________ seconds. 4. Slowly return to the starting position. 5. Repeat with your other leg. Repeat __________ times. Complete this exercise __________ times a day. Exercise B: Knee to chest with hip adduction and internal rotation  1. Lie on your back on a firm surface with both legs straight. 2. Bend one of your knees and move it up toward your chest until you feel a gentle stretch in  your lower back and buttock. Then, move your knee toward the shoulder that is on the opposite side from your leg. ? Hold your leg in this position by holding onto the front of your knee. 3. Hold for __________ seconds. 4. Slowly return to the starting position. 5. Repeat with your other leg. Repeat __________ times. Complete this exercise __________ times a day. Exercise C: Prone extension on elbows  1. Lie on your abdomen on a firm surface. A bed may be too soft for this exercise. 2. Prop yourself up on your elbows. 3. Use your arms to help lift your chest up until you feel a gentle stretch in your abdomen and your lower back. ? This will place some of your body weight on your elbows. If this is uncomfortable, try stacking pillows under your chest. ? Your hips should stay down, against the surface that you are lying on. Keep your hip and back muscles relaxed. 4. Hold for __________ seconds. 5. Slowly relax your upper body and return to the starting position. Repeat __________ times. Complete this exercise __________ times a day. Strengthening exercises These exercises build strength and endurance in your back. Endurance is the ability to use your muscles for a long time, even after they get tired. Exercise D: Pelvic tilt 1. Lie on your back on a firm surface. Bend your knees and keep your feet flat. 2. Tense your abdominal muscles. Tip your pelvis up toward the ceiling and flatten your lower back into the floor. ? To help with this exercise, you may place a small towel under your lower back and try to push your back into the towel. 3. Hold for __________ seconds. 4. Let your muscles relax completely before you repeat this exercise. Repeat __________ times. Complete this exercise __________ times a day. Exercise E: Alternating arm and leg raises  1. Get on your hands and knees on a firm surface. If you are on a hard floor, you may want to use padding to cushion your knees, such as an  exercise mat. 2. Line up your arms and legs. Your hands should be below your shoulders, and your knees should be below your hips. 3. Lift your left leg behind you. At the same time, raise your right arm and straighten it in front of you. ? Do not lift your leg higher than your hip. ? Do not lift your arm higher than your shoulder. ? Keep your abdominal and back muscles tight. ? Keep your hips facing the ground. ? Do not arch your back. ? Keep your balance carefully, and do  not hold your breath. 4. Hold for __________ seconds. 5. Slowly return to the starting position and repeat with your right leg and your left arm. Repeat __________ times. Complete this exercise __________ times a day. Posture and body mechanics  Body mechanics refers to the movements and positions of your body while you do your daily activities. Posture is part of body mechanics. Good posture and healthy body mechanics can help to relieve stress in your body's tissues and joints. Good posture means that your spine is in its natural S-curve position (your spine is neutral), your shoulders are pulled back slightly, and your head is not tipped forward. The following are general guidelines for applying improved posture and body mechanics to your everyday activities. Standing   When standing, keep your spine neutral and your feet about hip-width apart. Keep a slight bend in your knees. Your ears, shoulders, and hips should line up.  When you do a task in which you stand in one place for a long time, place one foot up on a stable object that is 2-4 inches (5-10 cm) high, such as a footstool. This helps keep your spine neutral. Sitting   When sitting, keep your spine neutral and keep your feet flat on the floor. Use a footrest, if necessary, and keep your thighs parallel to the floor. Avoid rounding your shoulders, and avoid tilting your head forward.  When working at a desk or a computer, keep your desk at a height where your  hands are slightly lower than your elbows. Slide your chair under your desk so you are close enough to maintain good posture.  When working at a computer, place your monitor at a height where you are looking straight ahead and you do not have to tilt your head forward or downward to look at the screen. Resting   When lying down and resting, avoid positions that are most painful for you.  If you have pain with activities such as sitting, bending, stooping, or squatting (flexion-based activities), lie in a position in which your body does not bend very much. For example, avoid curling up on your side with your arms and knees near your chest (fetal position).  If you have pain with activities such as standing for a long time or reaching with your arms (extension-based activities), lie with your spine in a neutral position and bend your knees slightly. Try the following positions: ? Lying on your side with a pillow between your knees. ? Lying on your back with a pillow under your knees. Lifting   When lifting objects, keep your feet at least shoulder-width apart and tighten your abdominal muscles.  Bend your knees and hips and keep your spine neutral. It is important to lift using the strength of your legs, not your back. Do not lock your knees straight out.  Always ask for help to lift heavy or awkward objects. This information is not intended to replace advice given to you by your health care provider. Make sure you discuss any questions you have with your health care provider. Document Released: 11/02/2005 Document Revised: 07/09/2016 Document Reviewed: 07/19/2015 Elsevier Interactive Patient Education  [employer].

## 2017-08-25 LAB — CBC WITH DIFFERENTIAL/PLATELET
BASOS ABS: 40 {cells}/uL (ref 0–200)
BASOS PCT: 1.2 %
EOS PCT: 4.5 %
Eosinophils Absolute: 149 cells/uL (ref 15–500)
HEMATOCRIT: 36 % (ref 35.0–45.0)
HEMOGLOBIN: 12.4 g/dL (ref 11.7–15.5)
LYMPHS ABS: 706 {cells}/uL — AB (ref 850–3900)
MCH: 31.1 pg (ref 27.0–33.0)
MCHC: 34.4 g/dL (ref 32.0–36.0)
MCV: 90.2 fL (ref 80.0–100.0)
MONOS PCT: 9.9 %
MPV: 9.5 fL (ref 7.5–12.5)
NEUTROS ABS: 2079 {cells}/uL (ref 1500–7800)
Neutrophils Relative %: 63 %
Platelets: 279 10*3/uL (ref 140–400)
RBC: 3.99 10*6/uL (ref 3.80–5.10)
RDW: 12.6 % (ref 11.0–15.0)
Total Lymphocyte: 21.4 %
WBC mixed population: 327 cells/uL (ref 200–950)
WBC: 3.3 10*3/uL — ABNORMAL LOW (ref 3.8–10.8)

## 2017-08-25 LAB — COMPLETE METABOLIC PANEL WITH GFR
AG Ratio: 1.8 (calc) (ref 1.0–2.5)
ALBUMIN MSPROF: 4.1 g/dL (ref 3.6–5.1)
ALKALINE PHOSPHATASE (APISO): 51 U/L (ref 33–130)
ALT: 16 U/L (ref 6–29)
AST: 27 U/L (ref 10–35)
BILIRUBIN TOTAL: 0.6 mg/dL (ref 0.2–1.2)
BUN: 10 mg/dL (ref 7–25)
CHLORIDE: 102 mmol/L (ref 98–110)
CO2: 29 mmol/L (ref 20–32)
CREATININE: 0.59 mg/dL (ref 0.50–0.99)
Calcium: 9.1 mg/dL (ref 8.6–10.4)
GFR, Est African American: 111 mL/min/{1.73_m2} (ref >=60)
GFR, Est Non African American: 96 mL/min/{1.73_m2} (ref >=60)
GLUCOSE: 95 mg/dL (ref 65–99)
Globulin: 2.3 g/dL (calc) (ref 1.9–3.7)
Potassium: 4.8 mmol/L (ref 3.5–5.3)
Sodium: 137 mmol/L (ref 135–146)
Total Protein: 6.4 g/dL (ref 6.1–8.1)

## 2017-08-25 LAB — LIPID PANEL
CHOLESTEROL: 167 mg/dL (ref <200)
HDL: 89 mg/dL (ref >50)
LDL CHOLESTEROL (CALC): 67 mg/dL
Non-HDL Cholesterol (Calc): 78 mg/dL (calc) (ref <130)
TRIGLYCERIDES: 40 mg/dL (ref <150)
Total CHOL/HDL Ratio: 1.9 (calc) (ref <5.0)

## 2017-08-25 LAB — TSH: TSH: 2.57 m[IU]/L (ref 0.40–4.50)

## 2017-08-26 ENCOUNTER — Telehealth: Payer: Self-pay

## 2017-08-26 NOTE — Telephone Encounter (Signed)
-----   Message from Mar Daring, [place] sent at 08/26/2017  8:07 AM EDT ----- All labs are within normal limits and stable.  Thanks! -JB

## 2017-08-26 NOTE — Telephone Encounter (Signed)
LMtcb if she has any concerns. Patient is also active in [place].  Thanks,  -Joseline

## 2017-09-27 ENCOUNTER — Ambulatory Visit
Admission: RE | Admit: 2017-09-27 | Discharge: 2017-09-27 | Disposition: A | Discharge status: Home / Self Care | Payer: Medicare Other | Source: Ambulatory Visit | Attending: Physician Assistant | Admitting: Physician Assistant

## 2017-09-27 ENCOUNTER — Telehealth: Payer: Self-pay

## 2017-09-27 DIAGNOSIS — M85832 Other specified disorders of bone density and structure, left forearm: Secondary | ICD-10-CM | POA: Diagnosis not present

## 2017-09-27 DIAGNOSIS — Z1231 Encounter for screening mammogram for malignant neoplasm of breast: Secondary | ICD-10-CM | POA: Diagnosis present

## 2017-09-27 DIAGNOSIS — Z780 Asymptomatic menopausal state: Secondary | ICD-10-CM | POA: Diagnosis present

## 2017-09-27 DIAGNOSIS — Z13820 Encounter for screening for osteoporosis: Secondary | ICD-10-CM

## 2017-09-27 DIAGNOSIS — M5432 Sciatica, left side: Principal | ICD-10-CM

## 2017-09-27 DIAGNOSIS — M85851 Other specified disorders of bone density and structure, right thigh: Secondary | ICD-10-CM | POA: Diagnosis not present

## 2017-09-27 DIAGNOSIS — Z1239 Encounter for other screening for malignant neoplasm of breast: Secondary | ICD-10-CM

## 2017-09-27 DIAGNOSIS — M5431 Sciatica, right side: Secondary | ICD-10-CM

## 2017-09-27 NOTE — Telephone Encounter (Signed)
-----   Message from Mar Daring, [place] sent at 09/27/2017  3:03 PM EST ----- BMD shows osteopenia. Continue Vit D 800 IU daily minimum and calcium 1200mg  daily minimum and weight bearing exercises. Will recheck in 2-3 years.

## 2017-09-27 NOTE — Telephone Encounter (Signed)
-----   Message from Mar Daring, [place] sent at 09/27/2017  4:32 PM EST ----- Normal mammogram. Repeat screening in one year.

## 2017-09-27 NOTE — Telephone Encounter (Signed)
Patient was advised as directed below.  Patient states that she is willing to try the antiinflammatory for the sciatica.  Thanks,  -Joseline

## 2017-09-28 MED ORDER — MELOXICAM 15 MG PO TABS: 15.0000 mg | ORAL_TABLET | Freq: Every day | ORAL | 3 refills | Status: DC

## 2017-09-28 NOTE — Telephone Encounter (Signed)
Meloxicam sent to [facility]

## 2017-11-05 ENCOUNTER — Ambulatory Visit (INDEPENDENT_AMBULATORY_CARE_PROVIDER_SITE_OTHER): Payer: Medicare Other | Admitting: Physician Assistant

## 2017-11-05 ENCOUNTER — Other Ambulatory Visit: Payer: Self-pay

## 2017-11-05 ENCOUNTER — Ambulatory Visit
Admission: RE | Admit: 2017-11-05 | Discharge: 2017-11-05 | Disposition: A | Discharge status: Home / Self Care | Payer: Medicare Other | Source: Ambulatory Visit | Attending: Physician Assistant | Admitting: Physician Assistant

## 2017-11-05 ENCOUNTER — Encounter: Payer: Self-pay | Admitting: Physician Assistant

## 2017-11-05 VITALS — BP 130/88 | HR 70 | Temp 98.0°F | Resp 16 | Wt 188.0 lb

## 2017-11-05 DIAGNOSIS — M5442 Lumbago with sciatica, left side: Secondary | ICD-10-CM

## 2017-11-05 DIAGNOSIS — M47896 Other spondylosis, lumbar region: Secondary | ICD-10-CM | POA: Diagnosis not present

## 2017-11-05 DIAGNOSIS — Z23 Encounter for immunization: Secondary | ICD-10-CM | POA: Diagnosis not present

## 2017-11-05 NOTE — Progress Notes (Signed)
Patient: Allison [name] Female    DOB: 1951/09/19   66 y.o.   MRN: [id number] Visit Date: 11/05/2017  Today's Provider: Mar Daring, PA-C   Chief Complaint  Patient presents with  . Leg Pain   Subjective:    Leg Pain   There was no injury mechanism (Pain has been present for 2-3 months). The pain is present in the left leg (lower back). The pain is at a severity of 4/10. The pain is moderate. The pain has been fluctuating since onset. Associated symptoms include muscle weakness and tingling. Pertinent negatives include no inability to bear weight, loss of motion, loss of sensation or numbness. She has tried NSAIDs for the symptoms. The treatment provided moderate relief.   Pt states she stopped going to the gym, and went to a chiropractor, which improved sx. She states she went [place] about 2 weeks ago. The warm weather greatly improved pain. Since coming back to the cold, damp weather, the pain is recurring.      Allergies  Allergen Reactions  . Strawberry Extract     difficulty breathing  . Tetracycline Rash     Current Outpatient Medications:  .  Calcium Carb-Cholecalciferol (CALCIUM + D3) 600-200 MG-UNIT TABS, Take 2 tablets by mouth daily. , Disp: , Rfl:  .  ferrous sulfate 325 (65 FE) MG tablet, Take 325 mg by mouth daily with breakfast., Disp: , Rfl:  .  FINACEA 15 % FOAM, APPLY A SMALL AMOUNT ON THE SKIN DAILY, Disp: , Rfl: 11 .  meloxicam (MOBIC) 15 MG tablet, Take 1 tablet (15 mg total) daily by mouth., Disp: 30 tablet, Rfl: 3 .  RHOFADE 1 % CREA, APPLY TO FACE QAM, Disp: , Rfl: 11  Review of Systems  Constitutional: Negative.   Respiratory: Negative.   Cardiovascular: Negative.   Gastrointestinal: Negative.   Musculoskeletal: Positive for back pain and myalgias.  Neurological: Positive for tingling. Negative for numbness.    Social History   Tobacco Use  . Smoking status: Never Smoker  . Smokeless tobacco: Never Used    Substance Use Topics  . Alcohol use: Yes    Comment: 2 glasses wine daily   Objective:   BP 130/88 (BP Location: Left Arm, Patient Position: Sitting, Cuff Size: Large)   Pulse 70   Temp 98 F (36.7 C) (Oral)   Resp 16   Wt 188 lb (85.3 kg)   SpO2 97%   BMI 26.98 kg/m    Physical Exam  Constitutional: She appears well-developed and well-nourished. No distress.  Neck: Normal range of motion. Neck supple.  Cardiovascular: Normal rate, regular rhythm and normal heart sounds. Exam reveals no gallop and no friction rub.  No murmur heard. Pulmonary/Chest: Effort normal and breath sounds normal. No respiratory distress. She has no wheezes. She has no rales.  Musculoskeletal:       Lumbar back: She exhibits tenderness and bony tenderness. She exhibits normal range of motion and no spasm.  Neg SLR  Skin: She is not diaphoretic.  Vitals reviewed.      Assessment & Plan:      1. Acute left-sided low back pain with left-sided sciatica Will get imaging as below and f/u pending results. Will go forth with conservative management with PT as below. Continue meloxicam prn. Chiropractic care prn.  - DG Lumbar Spine Complete; Future - Ambulatory referral to Physical Therapy  2. Need for shingles vaccine Shingrix #2 vaccine given to  patient without complications. Patient sat for 15 minutes after administration and was tolerated well without adverse effects. - Varicella-zoster vaccine IM       Mar Daring, PA-C  [facility]

## 2017-11-05 NOTE — Patient Instructions (Signed)

## 2017-11-08 ENCOUNTER — Encounter: Payer: Self-pay | Admitting: Physician Assistant

## 2017-11-25 ENCOUNTER — Other Ambulatory Visit: Payer: Self-pay

## 2017-11-25 ENCOUNTER — Encounter: Payer: Self-pay | Admitting: Physical Therapy

## 2017-11-25 ENCOUNTER — Ambulatory Visit: Payer: Medicare Other | Attending: Physician Assistant | Admitting: Physical Therapy

## 2017-11-25 DIAGNOSIS — M62830 Muscle spasm of back: Secondary | ICD-10-CM | POA: Diagnosis present

## 2017-11-25 DIAGNOSIS — M6281 Muscle weakness (generalized): Secondary | ICD-10-CM | POA: Insufficient documentation

## 2017-11-25 DIAGNOSIS — M5442 Lumbago with sciatica, left side: Secondary | ICD-10-CM | POA: Insufficient documentation

## 2017-11-26 NOTE — Therapy (Signed)
[place] [facility] [address] [place], [address] Phone: [phone]   Fax:  [phone]  Physical Therapy Evaluation  Patient Details  Name: Allison [name] MRN: [id number] Date of Birth: May 20, 1951 Referring Provider: Mar Daring PA-C   Encounter Date: 11/25/2017  PT End of Session - 11/25/17 1520    Visit Number  1    Number of Visits  16    Date for PT Re-Evaluation  01/20/18    PT Start Time  0930    PT Stop Time  1030    PT Time Calculation (min)  60 min    Activity Tolerance  Patient tolerated treatment well    Behavior During Therapy  [facility] for tasks assessed/performed       Past Medical History:  Diagnosis Date  . Medical history non-contributory   . Rosacea     Past Surgical History:  Procedure Laterality Date  . BREAST BIOPSY Left 2002   benign  . CATARACT EXTRACTION W/PHACO Right 07/06/2017   Procedure: CATARACT EXTRACTION PHACO AND INTRAOCULAR LENS PLACEMENT (IOC);  Surgeon: Birder Robson, MD;  Location: [facility] ORS;  Service: Ophthalmology;  Laterality: Right;  [place] 00:34.2 AP% 12.7 CDE 4.33 Fluid Pack lot # [id number] H  . NO PAST SURGERIES      There were no vitals filed for this visit.   Subjective Assessment - 11/25/17 0934    Subjective  Patient reports she is improving with back and left leg pain and continues with left LE pain with difficulty with raising left leg into car. 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  10 min and depends on chair and using ice  helps    How long can you stand comfortably?  as long as needs to    How long can you walk comfortably?  30 min at least    Diagnostic tests  X rays lumbar spine    Patient Stated Goals  decrease pain in back and return to prior level of function; exercising; going up/down steps reciprocally; sleep without pain, difficulty   Currently in Pain?  Yes    Pain Score  2     Pain Location  Back    Pain Orientation  Left;Posterior    Pain Descriptors / Indicators  Cramping;Other (Comment) aggravating    Pain Type  Acute pain    Pain Onset  More than a month ago    Pain Frequency  Intermittent    Aggravating Factors   sitting, bending    Pain Relieving Factors  walking, movement, ice          [facility] PT Assessment - 11/25/17 0953      Assessment   Medical Diagnosis  Acute left sided low back pain with left sided sciatica    Referring Provider  Mar Daring PA-C    Onset Date/Surgical Date  08/16/17    Hand Dominance  Right    Prior Therapy  none      Precautions   Precautions  None  Balance Screen   Has the patient fallen in the past 6 months  No      [place] residence    Living Arrangements  Spouse/significant other    Type of [place] to enter    Entrance Stairs-Number of Steps  6    Entrance Stairs-Rails  Left    Home Layout  Two level    Alternate Level Stairs-Number of Steps  15    Alternate Level Stairs-Rails  Right;Left      Prior Function   Level of Independence  Independent    Vocation  Retired semi retired    Leisure  travel      Charity fundraiser Status  Within Functional Limits for tasks assessed      Observation/Other Assessments   Modified [place]  26%      Sensation   Light Touch  Appears Intact      Posture/Postural Control   Posture Comments  standing and sitting WFL      ROM / Strength   AROM / PROM / Strength  AROM;Strength      AROM   Overall AROM  Comments  lumbar spine flexion 25% decreased, limited hamstring flexibility no reproduction of symptoms with repetition; extension 25% decrease with no reproduction of symptoms with repetition; side bending WNL without reproduction of symptoms      Strength   Overall Strength Comments  WFL with exception left LE knee flexion weak 4-/5, extension 4/5,      Palpation   Palpation comment  palpable spasms left lower back       FABER test   findings  Negative    Comment  negative for reproduction of symptoms, both hips with decreased ER ROM by 50%, left > right       Slump test   Findings  Negative    Comment  bilateral      Straight Leg Raise   Findings  Negative    Comment  SLR and crossed  SLR bilateral      Ambulation/Gait   Gait Comments  mild guarding with decreased trunk rotation             Objective measurements completed on examination: See above findings.     PT Education - 11/25/17 1519    Education provided  Yes    Education Details  POC: body mechanics for daily activities, standing extension, seated hip adduction with ball and glute sets; use of heat/ice    Person(s) Educated  Patient    Methods  Explanation;Demonstration;Verbal cues;Handout    Comprehension  Verbal cues required;Returned demonstration;Verbalized understanding          PT Long Term Goals - 11/25/17 1531      PT LONG TERM GOAL #1   Title   Patient will demonstrate improved function with daily tasks and decreased back pain to improve stair climbing and transfers in/out of car as indicated by MODI score of 15%    Baseline  MODI 26%    Status  New    Target Date  12/16/17      PT LONG TERM GOAL #2   Title   Patient will demonstrate improved posture awareness and pain control strategies to allow patient to sit for >30  min. with minimal difficulty/pain    Baseline  10 min. sitting     Status  New    Target Date  12/23/17      PT LONG TERM GOAL #3   Title  Patient will be independent  with home program for posture awareness, pain control, progressive exercises to allow patient to transition to self management once discharged from physical therapy    Baseline  limited knowledge of pain control strategies, exercises or progression without guidance, instruction and cuing    Status  New    Target Date  01/20/18      PT LONG TERM GOAL #4   Title   Patient will demonstrate improved function with daily tasks and decreased back pain as indicated by MODI score of 10% or less    Baseline  MODI 26%    Status  New    Target Date  01/20/18             Plan - 11/25/17 1520    Clinical Impression Statement  Patient is a 67 year old female who presents with lower back pain with left sided radiculopathy. She has MODI score of 26% indicating moderate self perceived disability with daily tasks. She has limited knowledge of appropriate pain control strategies, exercises and progression and will benefit from physical therapy intervention to achieve prior level of function.     History and Personal Factors relevant to plan of care:  08/2017 pain began for no apparent reason; went to chiropractor in October through present with some results; She went on vacation recently and did not have any pain and when she returned home the pain began again    Clinical Presentation  Evolving    Clinical Presentation due to:  symptoms radiating into left LE    Clinical Decision Making  Low    Rehab Potential  Good    Clinical Impairments Affecting Rehab Potential  (+)acute condition, motivated, active ifestyle     PT Frequency  2x / week    PT Duration  8 weeks    PT Treatment/Interventions  Manual techniques;Patient/family education;Neuromuscular re-education;Therapeutic exercise;Moist Heat;Iontophoresis 4mg /ml Dexamethasone;Cryotherapy;Electrical Stimulation    PT Next Visit Plan  modalities for pain, spasms, manual therapy, exercise progression     PT Home Exercise Plan  posture awareness, log  rolling for transfers, hip adduction with ball and glute sets    Consulted and Agree with Plan of Care  Patient       Patient will benefit from skilled therapeutic intervention in order to improve the following deficits and impairments:  Decreased activity tolerance, Decreased endurance, Decreased strength, Impaired perceived functional ability, Increased muscle spasms, Pain, Improper body mechanics  Visit Diagnosis: Acute left-sided low back pain with left-sided sciatica - Plan: PT plan of care cert/re-cert  Muscle weakness (generalized) - Plan: PT plan of care cert/re-cert  Muscle spasm of back - Plan: PT plan of care cert/re-cert     Problem List Patient Active Problem List   Diagnosis Date Noted  . Blood in the urine 08/13/2015  . H/O hyperthyroidism 08/13/2015  . H/O amblyopia 08/13/2015    Jomarie Longs PT 11/26/2017, 3:41 PM  [place] [facility] [address] [place], [address] Phone: [phone]   Fax:  [phone]  Name: Allison [name] MRN: [id number] Date of Birth: 01-09-1951

## 2017-11-30 ENCOUNTER — Encounter: Payer: Self-pay | Admitting: Physical Therapy

## 2017-11-30 ENCOUNTER — Ambulatory Visit: Payer: Medicare Other | Admitting: Physical Therapy

## 2017-11-30 DIAGNOSIS — M5442 Lumbago with sciatica, left side: Secondary | ICD-10-CM | POA: Diagnosis not present

## 2017-11-30 DIAGNOSIS — M62830 Muscle spasm of back: Secondary | ICD-10-CM

## 2017-11-30 DIAGNOSIS — M6281 Muscle weakness (generalized): Secondary | ICD-10-CM

## 2017-11-30 NOTE — Therapy (Signed)
[place] [facility] [address] [place], [address] Phone: [phone]   Fax:  [phone]  Physical Therapy Treatment  Patient Details  Name: Kili Gracy MRN: [id number] Date of Birth: 09-20-1951 Referring Provider: Mar Daring PA-C   Encounter Date: 11/30/2017  PT End of Session - 11/30/17 1051    Visit Number  2    Number of Visits  16    Date for PT Re-Evaluation  01/20/18    PT Start Time  1042    PT Stop Time  1122    PT Time Calculation (min)  40 min    Activity Tolerance  Patient tolerated treatment well    Behavior During Therapy  [facility] for tasks assessed/performed       Past Medical History:  Diagnosis Date  . Medical history non-contributory   . Rosacea     Past Surgical History:  Procedure Laterality Date  . BREAST BIOPSY Left 2002   benign  . CATARACT EXTRACTION W/PHACO Right 07/06/2017   Procedure: CATARACT EXTRACTION PHACO AND INTRAOCULAR LENS PLACEMENT (IOC);  Surgeon: Birder Robson, MD;  Location: [facility] ORS;  Service: Ophthalmology;  Laterality: Right;  [place] 00:34.2 AP% 12.7 CDE 4.33 Fluid Pack lot # [id number] H  . NO PAST SURGERIES      There were no vitals filed for this visit.  Subjective Assessment - 11/30/17 1046    Subjective  Patient reports she is doing exercises as instructed and is modfying exercises to prevent leg symptoms    Pertinent History  08/2017 pain began for no apparent reason; went to chiropractor in October through present with some results; She went on vacation recently and did not have any pain and when she returned home the pain began again    Limitations  Sitting;Lifting;House hold activities;Other (comment) bending    How long can you sit comfortably?  10 min and depends on chair and using ice helps    How long can you stand comfortably?  as long as needs to    How long can you walk comfortably?  30 min at least    Diagnostic tests  X rays lumbar  spine    Patient Stated Goals  decrease pain in back and return to prior level of function; exercising; going up/down steps reciprocally; sleep without    Currently in Pain?  Yes    Pain Score  2     Pain Location  Leg    Pain Orientation  Left    Pain Descriptors / Indicators  Cramping aggravatoin    Pain Type  Acute pain    Pain Onset  More than a month ago    Pain Frequency  Intermittent        Objective:  Palpation: tender with spasms along lower lumbar spine  Treatment: Manual therapy: 12 min goal: improved soft tissue elasticity, spasms, pain STM performed superficial techniques lumbar spine with patient prone lying over 2 pillows   Therapeutic exercise: patient performed with demonstration, verbal and tactile cues of therapist: goal: independent with home program, pain, improve MODI Prone lying over pillows with decreased left LE symptoms to 0/10 Prone on elbows x 2-3 min. Prone press ups x 10 through partial ROM Prone hip extension x 5 reps each LE Ice pack applied to lower back while prone lying for inflammation/pain; no adverse effects noted  Patient response to treatment: patient demonstrated improved technique with exercises with minimal VC for correct alignment. Patient with decreased  pain from  2 /10 to  0/10. Patient with decreased spasms by 50% following STM. Improved motor control with repetition and cuing with modification of press ups through partial ROM      PT Education - 11/30/17 1050    Education provided  Yes    Education Details  re assessed home exercises for technique and advancing for ROM prone lying    Person(s) Educated  Patient    Methods  Explanation;Demonstration;Verbal cues    Comprehension  Verbalized understanding;Returned demonstration;Verbal cues required          PT Long Term Goals - 11/25/17 1531      PT LONG TERM GOAL #1   Title   Patient will demonstrate improved function with daily tasks and decreased back pain to improve  stair climbing and transfers in/out of car as indicated by MODI score of 15%    Baseline  MODI 26%    Status  New    Target Date  12/16/17      PT LONG TERM GOAL #2   Title   Patient will demonstrate improved posture awareness and pain control strategies to allow patient to sit for >30  min. with minimal difficulty/pain    Baseline  10 min. sitting     Status  New    Target Date  12/23/17      PT LONG TERM GOAL #3   Title  Patient will be independent with home program for posture awareness, pain control, progressive exercises to allow patient to transition to self management once discharged from physical therapy    Baseline  limited knowledge of pain control strategies, exercises or progression without guidance, instruction and cuing    Status  New    Target Date  01/20/18      PT LONG TERM GOAL #4   Title   Patient will demonstrate improved function with daily tasks and decreased back pain as indicated by MODI score of 10% or less    Baseline  MODI 26%    Status  New    Target Date  01/20/18            Plan - 11/30/17 1052    Clinical Impression Statement  Patient is progressing with exercises and goals. She is progressing with home exercise and able to modify exercises depending on her symptoms    Rehab Potential  Good    Clinical Impairments Affecting Rehab Potential  (+)acute condition, motivated, active ifestyle     PT Frequency  2x / week    PT Duration  8 weeks    PT Treatment/Interventions  Manual techniques;Patient/family education;Neuromuscular re-education;Therapeutic exercise;Moist Heat;Iontophoresis 4mg /ml Dexamethasone;Cryotherapy;Electrical Stimulation    PT Next Visit Plan  modalities for pain, spasms, manual therapy, exercise progression     PT Home Exercise Plan  posture awareness, log rolling for transfers, hip adduction with ball and glute sets       Patient will benefit from skilled therapeutic intervention in order to improve the following deficits and  impairments:  Decreased activity tolerance, Decreased endurance, Decreased strength, Impaired perceived functional ability, Increased muscle spasms, Pain, Improper body mechanics  Visit Diagnosis: Acute left-sided low back pain with left-sided sciatica  Muscle weakness (generalized)  Muscle spasm of back     Problem List Patient Active Problem List   Diagnosis Date Noted  . Blood in the urine 08/13/2015  . H/O hyperthyroidism 08/13/2015  . H/O amblyopia 08/13/2015    Jomarie Longs PT 12/01/2017, 3:05 PM  [place]  [place] [facility] [address] [place], [address] Phone: [phone]   Fax:  [phone]  Name: Jaedynn Bohlken MRN: [id number] Date of Birth: 09-16-1951

## 2017-12-02 ENCOUNTER — Ambulatory Visit: Payer: Medicare Other | Admitting: Physical Therapy

## 2017-12-02 ENCOUNTER — Encounter: Payer: Self-pay | Admitting: Physical Therapy

## 2017-12-02 DIAGNOSIS — M5442 Lumbago with sciatica, left side: Secondary | ICD-10-CM

## 2017-12-02 DIAGNOSIS — M62830 Muscle spasm of back: Secondary | ICD-10-CM

## 2017-12-02 DIAGNOSIS — M6281 Muscle weakness (generalized): Secondary | ICD-10-CM

## 2017-12-02 NOTE — Therapy (Signed)
[place] [facility] [address], [place], [address] Phone: [phone]   Fax:  [phone]  Physical Therapy Treatment  Patient Details  Name: Allison [name] MRN: [id number] Date of Birth: [birth year] Referring Provider: Mar Daring PA-C   Encounter Date: 12/02/2017  PT End of Session - 12/02/17 1031    Visit Number  3    Number of Visits  16    Date for PT Re-Evaluation  01/20/18    PT Start Time  0948    PT Stop Time  1028    PT Time Calculation (min)  40 min    Activity Tolerance  Patient tolerated treatment well    Behavior During Therapy  [facility] for tasks assessed/performed       Past Medical History:  Diagnosis Date  . Medical history non-contributory   . Rosacea     Past Surgical History:  Procedure Laterality Date  . BREAST BIOPSY Left 2002   benign  . CATARACT EXTRACTION W/PHACO Right 07/06/2017   Procedure: CATARACT EXTRACTION PHACO AND INTRAOCULAR LENS PLACEMENT (IOC);  Surgeon: Birder Robson, MD;  Location: [facility] ORS;  Service: Ophthalmology;  Laterality: Right;  [place] 00:34.2 AP% 12.7 CDE 4.33 Fluid Pack lot # [id number] H  . NO PAST SURGERIES      There were no vitals filed for this visit.  Subjective Assessment - 12/02/17 0951    Subjective  patient reports she continues with sitting back and left  leg pain.     Pertinent History  08/2017 pain began for no apparent reason; went to chiropractor in October through present with some results; She went on vacation recently and did not have any pain and when she returned home the pain began again    Limitations  Sitting;Lifting;House hold activities;Other (comment) bending    How long can you sit comfortably?  10 min and depends on chair and using ice helps    How long can you stand comfortably?  as long as needs to    How long can you walk comfortably?  30 min at least    Diagnostic tests  X rays lumbar spine    Patient Stated Goals   decrease pain in back and return to prior level of function; exercising; going up/down steps reciprocally; sleep without    Currently in Pain?  Other (Comment) left lower back pain with sitting only, standing ok    Pain Onset  More than a month ago          Objective:  Palpation: tender with spasms along lower lumbar spine left>right  Treatment: Manual therapy: 15 min goal: improved soft tissue elasticity, spasms, pain STM performed superficial techniques lumbar spine with patient prone lying over 1 pillow, one pillow supporting LE's    Therapeutic exercise: patient performed with demonstration, verbal and tactile cues of therapist: goal: independent with home program, pain, improve MODI Prone lying over pillows with no Left LE symptoms Re assessed home program of exercises hip adduction with ball and glute sets, prone progression Instructed in transfers from floor to standing with good posture; performed 2-3 x with guidance, repeated demonstration and VC    Modalities: Electrical stimulation: 15 min.: High volt estim.clincial program for muscle spasms  (4) electrodes applied to bilateral lumbar spine intensity to tolerance with patient in prone position with pillow under pelvis and lower legs  goal: pain, spasms Ice pack applied to lower back while prone lying for inflammation/pain; no adverse effects noted  Patient response to treatment: patient demonstrated improved technique with transfers off floor and verbalized good understanding of exercises to continue at home. decreased spasms and improved soft tissue elasticity by 30% with STM and no pain reported at end of session following modalities.    PT Education - 12/02/17 1025    Education provided  Yes    Education Details  HEP; transfers from floor to standing     Person(s) Educated  Patient    Methods  Explanation;Demonstration;Verbal cues    Comprehension  Verbalized understanding;Returned demonstration;Verbal cues required           PT Long Term Goals - 11/25/17 1531      PT LONG TERM GOAL #1   Title   Patient will demonstrate improved function with daily tasks and decreased back pain to improve stair climbing and transfers in/out of car as indicated by MODI score of 15%    Baseline  MODI 26%    Status  New    Target Date  12/16/17      PT LONG TERM GOAL #2   Title   Patient will demonstrate improved posture awareness and pain control strategies to allow patient to sit for >30  min. with minimal difficulty/pain    Baseline  10 min. sitting     Status  New    Target Date  12/23/17      PT LONG TERM GOAL #3   Title  Patient will be independent with home program for posture awareness, pain control, progressive exercises to allow patient to transition to self management once discharged from physical therapy    Baseline  limited knowledge of pain control strategies, exercises or progression without guidance, instruction and cuing    Status  New    Target Date  01/20/18      PT LONG TERM GOAL #4   Title   Patient will demonstrate improved function with daily tasks and decreased back pain as indicated by MODI score of 10% or less    Baseline  MODI 26%    Status  New    Target Date  01/20/18            Plan - 12/02/17 1956    Clinical Impression Statement  Patient demonstrated good technqiue with floor transfers following demonstration and with repetition. Decreased symptoms in lower back and no leg symptoms at end of session following modalities. Patient will monitor pain relief and posture over weekend and should continue to progress with additional physical therapy intervention.     Rehab Potential  Good    Clinical Impairments Affecting Rehab Potential  (+)acute condition, motivated, active ifestyle     PT Frequency  2x / week    PT Duration  8 weeks    PT Treatment/Interventions  Manual techniques;Patient/family education;Neuromuscular re-education;Therapeutic exercise;Moist Heat;Iontophoresis  4mg /ml Dexamethasone;Cryotherapy;Electrical Stimulation    PT Next Visit Plan  modalities for pain, spasms, manual therapy, exercise progression     PT Home Exercise Plan  posture awareness, log rolling for transfers, hip adduction with ball and glute sets; prone progression through press ups       Patient will benefit from skilled therapeutic intervention in order to improve the following deficits and impairments:  Decreased activity tolerance, Decreased endurance, Decreased strength, Impaired perceived functional ability, Increased muscle spasms, Pain, Improper body mechanics  Visit Diagnosis: Acute left-sided low back pain with left-sided sciatica  Muscle weakness (generalized)  Muscle spasm of back     Problem List Patient Active Problem  List   Diagnosis Date Noted  . Blood in the urine 08/13/2015  . H/O hyperthyroidism 08/13/2015  . H/O amblyopia 08/13/2015    Jomarie Longs PT 12/02/2017, 7:59 PM  [place] [address] [place], [address] Phone: [phone]   Fax:  [phone]  Name: Allison [name] MRN: [id number] Date of Birth: [birth year]

## 2017-12-06 ENCOUNTER — Encounter: Payer: Self-pay | Admitting: Physical Therapy

## 2017-12-06 ENCOUNTER — Ambulatory Visit: Payer: Medicare Other | Admitting: Physical Therapy

## 2017-12-06 DIAGNOSIS — M62830 Muscle spasm of back: Secondary | ICD-10-CM

## 2017-12-06 DIAGNOSIS — M5442 Lumbago with sciatica, left side: Secondary | ICD-10-CM | POA: Diagnosis not present

## 2017-12-06 DIAGNOSIS — M6281 Muscle weakness (generalized): Secondary | ICD-10-CM

## 2017-12-06 NOTE — Therapy (Signed)
[facility] [address], [place], [address] Phone: [phone]   Fax:  [phone]  Physical Therapy Treatment  Patient Details  Name: Bristal Steffy MRN: [id number] Date of Birth: 13-Mar-1951 Referring Provider: Mar Daring PA-C   Encounter Date: 12/06/2017  PT End of Session - 12/06/17 1721    Visit Number  4    Number of Visits  16    Date for PT Re-Evaluation  01/20/18    PT Start Time  5176    PT Stop Time  1730    PT Time Calculation (min)  40 min    Activity Tolerance  Patient tolerated treatment well    Behavior During Therapy  [facility] for tasks assessed/performed       Past Medical History:  Diagnosis Date  . Medical history non-contributory   . Rosacea     Past Surgical History:  Procedure Laterality Date  . BREAST BIOPSY Left 2002   benign  . CATARACT EXTRACTION W/PHACO Right 07/06/2017   Procedure: CATARACT EXTRACTION PHACO AND INTRAOCULAR LENS PLACEMENT (IOC);  Surgeon: Birder Robson, MD;  Location: [facility] ORS;  Service: Ophthalmology;  Laterality: Right;  [place] 00:34.2 AP% 12.7 CDE 4.33 Fluid Pack lot # [id number] H  . NO PAST SURGERIES      There were no vitals filed for this visit.  Subjective Assessment - 12/06/17 1653    Subjective  Improving sleeping with prone lying.     Pertinent History  08/2017 pain began for no apparent reason; went to chiropractor in October through present with some results; She went on vacation recently and did not have any pain and when she returned home the pain began again    Limitations  Sitting;Lifting;House hold activities;Other (comment) bending    How long can you sit comfortably?  10 min and depends on chair and using ice helps    How long can you stand comfortably?  as long as needs to    How long can you walk comfortably?  30 min at least    Diagnostic tests  X rays lumbar spine    Patient Stated Goals  decrease pain in back and return  to prior level of function; exercising; going up/down steps reciprocally; sleep without    Currently in Pain?  Other (Comment) soreness with sitting for prolonged periods of time          Objective: Palpation: tender with spasms along lower lumbar spine left>right AROM: standing lumbar spine flexion decreased by 50%, extension decreased 30%, side bending able to perform without increased left sided pain   Treatment: Manual therapy: 15 min goal: improved soft tissue elasticity, spasms, pain STM performed superficial techniques lumbar spine with patient prone lying over 1 pillow, one pillow supporting LE's    Therapeutic exercise: patient performed with demonstration, verbal and tactile cues of therapist: goal: independent with home program, pain, improve MODI Prone lying over 1 pillow with no Left LE symptoms, in conjunction with estim.  Modalities: Electrical stimulation: 20 min.: High volt estim.clincial program for muscle spasms  (4) electrodes applied to bilateral lumbar spine intensity to tolerance with patient in prone position with pillow under pelvis and lower legs  goal: pain, spasms Ice pack applied to lower back while prone lying for inflammation/pain; no adverse effects noted  Patient response to treatment: Patient demonstrated decreased spasms with STM by 50% or more on left side lower back paraspinal muscles.  PT Education - 12/06/17 1701    Education provided  Yes    Education Details  re assessed home program    Person(s) Educated  Patient    Methods  Explanation;Demonstration;Verbal cues    Comprehension  Verbalized understanding;Returned demonstration;Verbal cues required          PT Long Term Goals - 11/25/17 1531      PT LONG TERM GOAL #1   Title   Patient will demonstrate improved function with daily tasks and decreased back pain to improve stair climbing and transfers in/out of car as indicated by MODI score of 15%    Baseline  MODI 26%     Status  New    Target Date  12/16/17      PT LONG TERM GOAL #2   Title   Patient will demonstrate improved posture awareness and pain control strategies to allow patient to sit for >30  min. with minimal difficulty/pain    Baseline  10 min. sitting     Status  New    Target Date  12/23/17      PT LONG TERM GOAL #3   Title  Patient will be independent with home program for posture awareness, pain control, progressive exercises to allow patient to transition to self management once discharged from physical therapy    Baseline  limited knowledge of pain control strategies, exercises or progression without guidance, instruction and cuing    Status  New    Target Date  01/20/18      PT LONG TERM GOAL #4   Title   Patient will demonstrate improved function with daily tasks and decreased back pain as indicated by MODI score of 10% or less    Baseline  MODI 26%    Status  New    Target Date  01/20/18            Plan - 12/06/17 1722    Clinical Impression Statement  Patient is progressing well towards goals with improvement noted with prolonged sitting and 50% less pain, difficulty with daily tasks including improved sleeping. She continues with spasms, decreased ROM lumbar flexion and extension and will benefit from continued physical therapy intervention to achieve goals.     Rehab Potential  Good    Clinical Impairments Affecting Rehab Potential  (+)acute condition, motivated, active ifestyle     PT Frequency  2x / week    PT Duration  8 weeks    PT Treatment/Interventions  Manual techniques;Patient/family education;Neuromuscular re-education;Therapeutic exercise;Moist Heat;Iontophoresis 4mg /ml Dexamethasone;Cryotherapy;Electrical Stimulation    PT Next Visit Plan  modalities for pain, spasms, manual therapy, exercise progression     PT Home Exercise Plan  posture awareness, log rolling for transfers, hip adduction with ball and glute sets; prone progression through press ups        Patient will benefit from skilled therapeutic intervention in order to improve the following deficits and impairments:  Decreased activity tolerance, Decreased endurance, Decreased strength, Impaired perceived functional ability, Increased muscle spasms, Pain, Improper body mechanics  Visit Diagnosis: Acute left-sided low back pain with left-sided sciatica  Muscle weakness (generalized)  Muscle spasm of back     Problem List Patient Active Problem List   Diagnosis Date Noted  . Blood in the urine 08/13/2015  . H/O hyperthyroidism 08/13/2015  . H/O amblyopia 08/13/2015    Jomarie Longs PT 12/07/2017, 9:30 PM  [facility] [address], [place], [address] Phone:  [phone]   Fax:  [phone]  Name: Ellan Tess MRN: [id number] Date of Birth: [birth year]

## 2017-12-08 ENCOUNTER — Ambulatory Visit: Payer: Medicare Other | Admitting: Physical Therapy

## 2017-12-08 ENCOUNTER — Encounter: Payer: Self-pay | Admitting: Physical Therapy

## 2017-12-08 DIAGNOSIS — M5442 Lumbago with sciatica, left side: Secondary | ICD-10-CM

## 2017-12-08 DIAGNOSIS — M6281 Muscle weakness (generalized): Secondary | ICD-10-CM

## 2017-12-08 DIAGNOSIS — M62830 Muscle spasm of back: Secondary | ICD-10-CM

## 2017-12-08 NOTE — Therapy (Signed)
[facility] [address], [place], [address] Phone: [phone]   Fax:  [phone]  Physical Therapy Treatment  Patient Details  Name: Allison [name] MRN: [id number] Date of Birth: [birth year] Referring Provider: Mar Daring PA-C   Encounter Date: 12/08/2017  PT End of Session - 12/08/17 1348    Visit Number  5    Number of Visits  16    Date for PT Re-Evaluation  01/20/18    PT Start Time  9767    PT Stop Time  1430    PT Time Calculation (min)  45 min    Activity Tolerance  Patient tolerated treatment well    Behavior During Therapy  [facility] for tasks assessed/performed       Past Medical History:  Diagnosis Date  . Medical history non-contributory   . Rosacea     Past Surgical History:  Procedure Laterality Date  . BREAST BIOPSY Left 2002   benign  . CATARACT EXTRACTION W/PHACO Right 07/06/2017   Procedure: CATARACT EXTRACTION PHACO AND INTRAOCULAR LENS PLACEMENT (IOC);  Surgeon: Birder Robson, MD;  Location: [facility] ORS;  Service: Ophthalmology;  Laterality: Right;  [place] 00:34.2 AP% 12.7 CDE 4.33 Fluid Pack lot # [id number] H  . NO PAST SURGERIES      There were no vitals filed for this visit.  Subjective Assessment - 12/08/17 1347    Subjective  Improving pain in left LE and more sore in lower back today    Pertinent History  08/2017 pain began for no apparent reason; went to chiropractor in October through present with some results; She went on vacation recently and did not have any pain and when she returned home the pain began again    Limitations  Sitting;Lifting;House hold activities;Other (comment) bending    How long can you sit comfortably?  10 min and depends on chair and using ice helps    How long can you stand comfortably?  as long as needs to    How long can you walk comfortably?  30 min at least    Diagnostic tests  X rays lumbar spine    Patient Stated Goals  decrease  pain in back and return to prior level of function; exercising; going up/down steps reciprocally; sleep without    Currently in Pain?  Yes    Pain Score  2     Pain Location  Back    Pain Orientation  Posterior    Pain Descriptors / Indicators  Aching;Sore    Pain Type  Acute pain    Pain Onset  More than a month ago    Pain Frequency  Intermittent          Objective: Palpation: tender with spasms along lower lumbar spineleft>right, improved from previous session AROM: standing lumbar spine flexion decreased by 50%, extension decreased 30%, improved from initial assessment and with patient leaning to right, side bending able to perform without increased left sided pain   Treatment: Manual therapy: 18 min goal: improved soft tissue elasticity, spasms, pain STM performed superficial techniques lumbar spine with patient prone lying over1pillow, one pillow supporting LE's  Therapeutic exercise: patient performed with demonstration, verbal and tactile cues of therapist: goal: independent with home program, pain, improve MODI Prone lying over 1 pillow withno Left LE symptoms, in conjunction with estim. Instructed in extension with side gliding to further reduce symptoms as needed: patient performed following demonstration and with VC  Modalities: Electrical  stimulation:20 min.:High volt estim.clincial program for muscle spasms (4) electrodes applied tobilateral lumbar spineintensity to tolerance with patient in proneposition with pillow under pelvis and lower legsgoal: pain, spasms Ice pack applied to lower back while prone lying for inflammation/pain; no adverse effects noted  Patient response to treatment:improved soft tissue elasticity and decreased tenderness by 50% following STM. Patient able to transfer off treatment table without reproduction of symptoms following treatment. Verbalized understanding of centralization of symptom and lateral shift with extension to  further assist with pain/symptom control     PT Education - 12/08/17 1400    Education provided  Yes    Education Details  educated in centralization of symptoms from LE to back and that back pain may worsen. goal to keep pain out of LE    Person(s) Educated  Patient    Methods  Explanation;Demonstration    Comprehension  Verbalized understanding          PT Long Term Goals - 11/25/17 1531      PT LONG TERM GOAL #1   Title   Patient will demonstrate improved function with daily tasks and decreased back pain to improve stair climbing and transfers in/out of car as indicated by MODI score of 15%    Baseline  MODI 26%    Status  New    Target Date  12/16/17      PT LONG TERM GOAL #2   Title   Patient will demonstrate improved posture awareness and pain control strategies to allow patient to sit for >30  min. with minimal difficulty/pain    Baseline  10 min. sitting     Status  New    Target Date  12/23/17      PT LONG TERM GOAL #3   Title  Patient will be independent with home program for posture awareness, pain control, progressive exercises to allow patient to transition to self management once discharged from physical therapy    Baseline  limited knowledge of pain control strategies, exercises or progression without guidance, instruction and cuing    Status  New    Target Date  01/20/18      PT LONG TERM GOAL #4   Title   Patient will demonstrate improved function with daily tasks and decreased back pain as indicated by MODI score of 10% or less    Baseline  MODI 26%    Status  New    Target Date  01/20/18            Plan - 12/08/17 1420    Clinical Impression Statement Patient is progressing well towards goals with centralization of symptoms to back and no pain in left LE. She is learning to control symptoms with positioning and exercises and should be able to advance to core strengthening in standing next session.     Rehab Potential  Good    Clinical  Impairments Affecting Rehab Potential  (+)acute condition, motivated, active ifestyle     PT Frequency  2x / week    PT Duration  8 weeks    PT Treatment/Interventions  Manual techniques;Patient/family education;Neuromuscular re-education;Therapeutic exercise;Moist Heat;Iontophoresis 4mg /ml Dexamethasone;Cryotherapy;Electrical Stimulation    PT Next Visit Plan  modalities for pain, spasms, manual therapy, exercise progression     PT Home Exercise Plan  posture awareness, log rolling for transfers, hip adduction with ball and glute sets; prone progression through press ups       Patient will benefit from skilled therapeutic intervention in order to improve the  following deficits and impairments:  Decreased activity tolerance, Decreased endurance, Decreased strength, Impaired perceived functional ability, Increased muscle spasms, Pain, Improper body mechanics  Visit Diagnosis: Acute left-sided low back pain with left-sided sciatica  Muscle weakness (generalized)  Muscle spasm of back     Problem List Patient Active Problem List   Diagnosis Date Noted  . Blood in the urine 08/13/2015  . H/O hyperthyroidism 08/13/2015  . H/O amblyopia 08/13/2015    Jomarie Longs PT 12/09/2017, 1:37 PM  [facility] [address] [place], [address] Phone: [phone]   Fax:  [phone]  Name: Rayanna Matusik MRN: [id number] Date of Birth: [birth year]

## 2017-12-14 ENCOUNTER — Ambulatory Visit: Payer: Medicare Other | Admitting: Physical Therapy

## 2017-12-14 ENCOUNTER — Encounter: Payer: Self-pay | Admitting: Physical Therapy

## 2017-12-14 DIAGNOSIS — M62830 Muscle spasm of back: Secondary | ICD-10-CM

## 2017-12-14 DIAGNOSIS — M5442 Lumbago with sciatica, left side: Secondary | ICD-10-CM

## 2017-12-14 DIAGNOSIS — M6281 Muscle weakness (generalized): Secondary | ICD-10-CM

## 2017-12-14 NOTE — Therapy (Signed)
[facility] [address], [place], [address] Phone: [phone]   Fax:  [phone]  Physical Therapy Treatment  Patient Details  Name: Allison [name] MRN: [id number] Date of Birth: 06-Apr-1951 Referring Provider: Mar Daring PA-C   Encounter Date: 12/14/2017  PT End of Session - 12/14/17 0803    Visit Number  6    Number of Visits  16    Date for PT Re-Evaluation  01/20/18    PT Start Time  0758    PT Stop Time  0847    PT Time Calculation (min)  49 min    Activity Tolerance  Patient tolerated treatment well    Behavior During Therapy  [facility] for tasks assessed/performed       Past Medical History:  Diagnosis Date  . Medical history non-contributory   . Rosacea     Past Surgical History:  Procedure Laterality Date  . BREAST BIOPSY Left 2002   benign  . CATARACT EXTRACTION W/PHACO Right 07/06/2017   Procedure: CATARACT EXTRACTION PHACO AND INTRAOCULAR LENS PLACEMENT (IOC);  Surgeon: Birder Robson, MD;  Location: [facility] ORS;  Service: Ophthalmology;  Laterality: Right;  [place] 00:34.2 AP% 12.7 CDE 4.33 Fluid Pack lot # [id number] H  . NO PAST SURGERIES      There were no vitals filed for this visit.  Subjective Assessment - 12/14/17 0800    Subjective  continues to see improvement with decreasing pain in left LE and back and is able to sit for longer periods of time without increased symptoms intermittently    Pertinent History  08/2017 pain began for no apparent reason; went to chiropractor in October through present with some results; She went on vacation recently and did not have any pain and when she returned home the pain began again    Limitations  Sitting;Lifting;House hold activities;Other (comment) bending    How long can you sit comfortably?  10 min and depends on chair and using ice helps    How long can you stand comfortably?  as long as needs to    How long can you walk comfortably?   30 min at least    Diagnostic tests  X rays lumbar spine    Patient Stated Goals  decrease pain in back and return to prior level of function; exercising; going up/down steps reciprocally; sleep without    Currently in Pain?  Yes    Pain Score  2     Pain Location  Back    Pain Orientation  Lower    Pain Descriptors / Indicators  Aching;Sore    Pain Type  Acute pain    Pain Onset  More than a month ago    Pain Frequency  Intermittent         Objective: Palpation: tender with spasms along lower lumbar spineleft>right, improved from previous session  Treatment: Manual therapy: 5min goal: improved soft tissue elasticity, spasms, pain STM performed superficial techniques lumbar spine with patient prone lying over1pillow, one pillow supporting LE's  Therapeutic exercise: patient performed with demonstration, verbal and tactile cues of therapist: goal: independent with home program, pain, improve MODI Prone lying over1pillow withno Left LE symptoms, in conjunction with estim. Standing stabilization with green resistive band high/low rows x 10 each Standing at door frame for scapular retraction with towel behind head and lumbar support x 5 reps  Modalities: Electrical stimulation:38min.:High volt estim.clincial program for muscle spasms (4) electrodes applied tobilateral lumbar spineintensity  to tolerance with patient in proneposition with pillow under pelvis and lower legsgoal: pain, spasms Ice pack applied to lower back while prone lying for inflammation/pain; no adverse effects noted  Patient response to treatment: improved soft tissue elasticity along lumbar spine by 50% with STM. Improved technique with exercises following demonstration and with moderate verbal cuing. No pain reported at end of session and reported feeling looser in lower back at end of session.        PT Education - 12/14/17 0826    Education provided  Yes    Education Details  exercise  instruction added green resistive band for scapular rows high and low to front of hips    Person(s) Educated  Patient    Methods  Explanation;Demonstration;Verbal cues;Handout    Comprehension  Verbal cues required;Verbalized understanding;Returned demonstration          PT Long Term Goals - 11/25/17 1531      PT LONG TERM GOAL #1   Title   Patient will demonstrate improved function with daily tasks and decreased back pain to improve stair climbing and transfers in/out of car as indicated by MODI score of 15%    Baseline  MODI 26%    Status  New    Target Date  12/16/17      PT LONG TERM GOAL #2   Title   Patient will demonstrate improved posture awareness and pain control strategies to allow patient to sit for >30  min. with minimal difficulty/pain    Baseline  10 min. sitting     Status  New    Target Date  12/23/17      PT LONG TERM GOAL #3   Title  Patient will be independent with home program for posture awareness, pain control, progressive exercises to allow patient to transition to self management once discharged from physical therapy    Baseline  limited knowledge of pain control strategies, exercises or progression without guidance, instruction and cuing    Status  New    Target Date  01/20/18      PT LONG TERM GOAL #4   Title   Patient will demonstrate improved function with daily tasks and decreased back pain as indicated by MODI score of 10% or less    Baseline  MODI 26%    Status  New    Target Date  01/20/18            Plan - 12/14/17 0804    Clinical Impression Statement  Patient continues to progress well with centralization of symptoms and posture awareness for daily tasks. She is compliant with home program and is progressing with core stabilization.     Rehab Potential  Good    Clinical Impairments Affecting Rehab Potential  (+)acute condition, motivated, active ifestyle     PT Frequency  2x / week    PT Duration  8 weeks    PT  Treatment/Interventions  Manual techniques;Patient/family education;Neuromuscular re-education;Therapeutic exercise;Moist Heat;Iontophoresis 4mg /ml Dexamethasone;Cryotherapy;Electrical Stimulation    PT Next Visit Plan  modalities for pain, spasms, manual therapy, exercise progression     PT Home Exercise Plan  posture awareness, log rolling for transfers, hip adduction with ball and glute sets; prone progression through press ups       Patient will benefit from skilled therapeutic intervention in order to improve the following deficits and impairments:  Decreased activity tolerance, Decreased endurance, Decreased strength, Impaired perceived functional ability, Increased muscle spasms, Pain, Improper body mechanics  Visit Diagnosis:  Acute left-sided low back pain with left-sided sciatica  Muscle weakness (generalized)  Muscle spasm of back     Problem List Patient Active Problem List   Diagnosis Date Noted  . Blood in the urine 08/13/2015  . H/O hyperthyroidism 08/13/2015  . H/O amblyopia 08/13/2015    Jomarie Longs PT 12/14/2017, 9:51 PM  [facility] [address] [place], [address] Phone: [phone]   Fax:  [phone]  Name: Allison [name] MRN: [id number] Date of Birth: 03-May-1951

## 2017-12-16 ENCOUNTER — Encounter: Payer: Self-pay | Admitting: Physical Therapy

## 2017-12-16 ENCOUNTER — Ambulatory Visit: Payer: Medicare Other | Admitting: Physical Therapy

## 2017-12-16 DIAGNOSIS — M5442 Lumbago with sciatica, left side: Secondary | ICD-10-CM

## 2017-12-16 DIAGNOSIS — M6281 Muscle weakness (generalized): Secondary | ICD-10-CM

## 2017-12-16 DIAGNOSIS — M62830 Muscle spasm of back: Secondary | ICD-10-CM

## 2017-12-16 NOTE — Therapy (Signed)
[place] [facility] [address], [place], [address] Phone: [phone]   Fax:  [phone]  Physical Therapy Treatment  Patient Details  Name: Allison [name] MRN: [id number] Date of Birth: [birth year] Referring Provider: Mar Daring PA-C   Encounter Date: 12/16/2017  PT End of Session - 12/16/17 0826    Visit Number  7    Number of Visits  16    Date for PT Re-Evaluation  01/20/18    PT Start Time  0820    PT Stop Time  0858    PT Time Calculation (min)  38 min    Activity Tolerance  Patient tolerated treatment well    Behavior During Therapy  [facility] for tasks assessed/performed       Past Medical History:  Diagnosis Date  . Medical history non-contributory   . Rosacea     Past Surgical History:  Procedure Laterality Date  . BREAST BIOPSY Left 2002   benign  . CATARACT EXTRACTION W/PHACO Right 07/06/2017   Procedure: CATARACT EXTRACTION PHACO AND INTRAOCULAR LENS PLACEMENT (IOC);  Surgeon: Birder Robson, MD;  Location: [facility] ORS;  Service: Ophthalmology;  Laterality: Right;  [place] 00:34.2 AP% 12.7 CDE 4.33 Fluid Pack lot # [id number] H  . NO PAST SURGERIES      There were no vitals filed for this visit.  Subjective Assessment - 12/16/17 0824    Subjective  Patient reports she "over did it" yesterday. She felt increased back symptoms and left LE symtpoms     Pertinent History  08/2017 pain began for no apparent reason; went to chiropractor in October through present with some results; She went on vacation recently and did not have any pain and when she returned home the pain began again    Limitations  Sitting;Lifting;House hold activities;Other (comment) bending    How long can you sit comfortably?  10 min and depends on chair and using ice helps    How long can you stand comfortably?  as long as needs to    How long can you walk comfortably?  30 min at least    Diagnostic tests  X rays lumbar  spine    Patient Stated Goals  decrease pain in back and return to prior level of function; exercising; going up/down steps reciprocally; sleep without    Currently in Pain?  Yes    Pain Score  3     Pain Location  Back    Pain Orientation  Lower    Pain Type  Acute pain    Pain Onset  More than a month ago    Pain Frequency  Intermittent       Objective: Palpation: tender with spasms along lower lumbar spineleft>right, improved from previous session  Treatment: Manual therapy: 72min goal: improved soft tissue elasticity, spasms, pain STM performed superficial techniques lumbar spine with patient prone lying over1pillow, one pillow supporting LE's  Therapeutic exercise: patient performed with demonstration, verbal and tactile cues of therapist: goal: independent with home program, pain, improve MODI Prone lying over1pillow withno Left LE symptoms, in conjunction with estim. Walking on diagonal with 5# weights in hands diagonal "wedding march" x 2 min. Planks on counter with good alignment and repeated demonstration x 3 sets Green resistive band for stabilization standing high/low rows x 10 -15 reps each  Modalities: Electrical stimulation:76min.:High volt estim.clincial program for muscle spasms (4) electrodes applied tobilateral lumbar spineintensity to tolerance with patient in proneposition with pillow under  pelvis and lower legsgoal: pain, spasms Ice pack applied to lower back while prone lying for inflammation/pain; no adverse effects noted  Patient response to treatment: improved soft tissue elasticity along lumbar spine by 50% with STM. Improved technique with exercises with repeated demonstration and moderate verbal cuing. Improved pain in lower back reported following exercise and at end of session        PT Education - 12/16/17 0825    Education provided  Yes    Education Details  exercise insruction re assessed     Person(s) Educated  Patient     Methods  Explanation    Comprehension  Verbalized understanding          PT Long Term Goals - 11/25/17 1531      PT LONG TERM GOAL #1   Title   Patient will demonstrate improved function with daily tasks and decreased back pain to improve stair climbing and transfers in/out of car as indicated by MODI score of 15%    Baseline  MODI 26%    Status  New    Target Date  12/16/17      PT LONG TERM GOAL #2   Title   Patient will demonstrate improved posture awareness and pain control strategies to allow patient to sit for >30  min. with minimal difficulty/pain    Baseline  10 min. sitting     Status  New    Target Date  12/23/17      PT LONG TERM GOAL #3   Title  Patient will be independent with home program for posture awareness, pain control, progressive exercises to allow patient to transition to self management once discharged from physical therapy    Baseline  limited knowledge of pain control strategies, exercises or progression without guidance, instruction and cuing    Status  New    Target Date  01/20/18      PT LONG TERM GOAL #4   Title   Patient will demonstrate improved function with daily tasks and decreased back pain as indicated by MODI score of 10% or less    Baseline  MODI 26%    Status  New    Target Date  01/20/18            Plan - 12/16/17 0827    Clinical Impression Statement  Patient continues to progress with knowledge of appropriate pain control strategies and posture. She is progressing steadily towards goals and should continue to progress with guided exercises and physical therapy intervention.      Rehab Potential  Good    Clinical Impairments Affecting Rehab Potential  (+)acute condition, motivated, active ifestyle     PT Frequency  2x / week    PT Duration  8 weeks    PT Treatment/Interventions  Manual techniques;Patient/family education;Neuromuscular re-education;Therapeutic exercise;Moist Heat;Iontophoresis 4mg /ml  Dexamethasone;Cryotherapy;Electrical Stimulation    PT Next Visit Plan  modalities for pain, spasms, manual therapy, exercise progression     PT Home Exercise Plan  posture awareness, log rolling for transfers, hip adduction with ball and glute sets; prone progression through press ups       Patient will benefit from skilled therapeutic intervention in order to improve the following deficits and impairments:  Decreased activity tolerance, Decreased endurance, Decreased strength, Impaired perceived functional ability, Increased muscle spasms, Pain, Improper body mechanics  Visit Diagnosis: Acute left-sided low back pain with left-sided sciatica  Muscle weakness (generalized)  Muscle spasm of back     Problem List Patient  Active Problem List   Diagnosis Date Noted  . Blood in the urine 08/13/2015  . H/O hyperthyroidism 08/13/2015  . H/O amblyopia 08/13/2015    Jomarie Longs PT 12/16/2017, 6:45 PM  [place] [facility] [address], [place], [address] Phone: [phone]   Fax:  [phone]  Name: Rafael Quesada MRN: [id number] Date of Birth: 12/30/1950

## 2017-12-21 ENCOUNTER — Ambulatory Visit: Payer: Medicare Other | Attending: Physician Assistant | Admitting: Physical Therapy

## 2017-12-21 ENCOUNTER — Encounter: Payer: Self-pay | Admitting: Physical Therapy

## 2017-12-21 DIAGNOSIS — M5442 Lumbago with sciatica, left side: Secondary | ICD-10-CM

## 2017-12-21 DIAGNOSIS — M62830 Muscle spasm of back: Secondary | ICD-10-CM

## 2017-12-21 DIAGNOSIS — M6281 Muscle weakness (generalized): Secondary | ICD-10-CM | POA: Diagnosis present

## 2017-12-21 NOTE — Therapy (Signed)
[place] [facility] [address] [place], [address] Phone: [phone]   Fax:  [phone]  Physical Therapy Treatment  Patient Details  Name: Allison [name] MRN: [id number] Date of Birth: April 28, 1951 Referring Provider: Mar Daring PA-C   Encounter Date: 12/21/2017  PT End of Session - 12/21/17 0854    Visit Number  8    Number of Visits  16    Date for PT Re-Evaluation  01/20/18    PT Start Time  0848    PT Stop Time  0933    PT Time Calculation (min)  45 min    Activity Tolerance  Patient tolerated treatment well    Behavior During Therapy  [facility] for tasks assessed/performed       Past Medical History:  Diagnosis Date  . Medical history non-contributory   . Rosacea     Past Surgical History:  Procedure Laterality Date  . BREAST BIOPSY Left 2002   benign  . CATARACT EXTRACTION W/PHACO Right 07/06/2017   Procedure: CATARACT EXTRACTION PHACO AND INTRAOCULAR LENS PLACEMENT (IOC);  Surgeon: Birder Robson, MD;  Location: [facility] ORS;  Service: Ophthalmology;  Laterality: Right;  [place] 00:34.2 AP% 12.7 CDE 4.33 Fluid Pack lot # [id number] H  . NO PAST SURGERIES      There were no vitals filed for this visit.  Subjective Assessment - 12/21/17 0852    Subjective  Patient reports mild pain left sided lower back pain    Pertinent History  08/2017 pain began for no apparent reason; went to chiropractor in October through present with some results; She went on vacation recently and did not have any pain and when she returned home the pain began again    Limitations  Sitting;Lifting;House hold activities;Other (comment) bending    How long can you sit comfortably?  10 min and depends on chair and using ice helps    How long can you stand comfortably?  as long as needs to    How long can you walk comfortably?  30 min at least    Diagnostic tests  X rays lumbar spine    Patient Stated Goals  decrease pain in  back and return to prior level of function; exercising; going up/down steps reciprocally; sleep without    Currently in Pain?  Yes    Pain Score  2     Pain Location  Back    Pain Orientation  Lower    Pain Descriptors / Indicators  Aching;Sore    Pain Type  Acute pain    Pain Onset  More than a month ago    Pain Frequency  Intermittent       Objective: Palpation: moderate spasms and tenderness along left lower back, lumbar paraspinal muslces  Treatment: Manual therapy: 36min goal: improved soft tissue elasticity, spasms, pain STM performed superficial techniques lumbar spine with patient prone lying over1pillow, one pillow supporting LE's  Therapeutic exercise: patient performed with demonstration, verbal and tactile cues of therapist: goal: independent with home program, pain, improve MODI Prone lying over1pillow withno Left LE symptoms, in conjunction with estim. Walking on diagonal  "wedding march" x 2 min. Planks on counter with good alignment and repeated demonstration x 3 sets Green resistive band for stabilization standing high/low rows x 10 -15 reps each Clamshells with green resistive band around thighs side lying x 10 each LE, sitting in chair x 15 Standing side stepping with green resistive band around thigh x 1  min.  Standing at door frame with towel behind head and lumbar support scapular retraction x 10  Modalities: Electrical stimulation:38min.:High volt estim.clincial program for muscle spasms (4) electrodes applied tobilateral lumbar spineintensity to tolerance with patient in proneposition with pillow under pelvis and lower legsgoal: pain, spasms Ice pack applied to lower back while prone lying for inflammation/pain; no adverse effects noted  Patient response to treatment: improved soft tissue elasticity with decreased spasms 50% and decreased pain to 0/10 at end of session. Improved technique with exercises following demonstration and with  minimal cuing.      PT Education - 12/21/17 [phone]    Education provided  Yes    Education Details  exercise instruction for technique; added band around thighs for side lying clamshells, sitting hip abduction; standing side stepping   Person(s) Educated  Patient    Methods  Explanation;Verbal cues;Demonstration    Comprehension  Verbal cues required;Returned demonstration;Verbalized understanding          PT Long Term Goals - 11/25/17 1531      PT LONG TERM GOAL #1   Title   Patient will demonstrate improved function with daily tasks and decreased back pain to improve stair climbing and transfers in/out of car as indicated by MODI score of 15%    Baseline  MODI 26%    Status  New    Target Date  12/16/17      PT LONG TERM GOAL #2   Title   Patient will demonstrate improved posture awareness and pain control strategies to allow patient to sit for >30  min. with minimal difficulty/pain    Baseline  10 min. sitting     Status  New    Target Date  12/23/17      PT LONG TERM GOAL #3   Title  Patient will be independent with home program for posture awareness, pain control, progressive exercises to allow patient to transition to self management once discharged from physical therapy    Baseline  limited knowledge of pain control strategies, exercises or progression without guidance, instruction and cuing    Status  New    Target Date  01/20/18      PT LONG TERM GOAL #4   Title   Patient will demonstrate improved function with daily tasks and decreased back pain as indicated by MODI score of 10% or less    Baseline  MODI 26%    Status  New    Target Date  01/20/18            Plan - 12/21/17 0930    Clinical Impression Statement  Patient demonstrates steady progress with goals and decreasing pain in lower back. She should continue to progress with additional physical thearpy intervention.     Rehab Potential  Good    Clinical Impairments Affecting Rehab Potential  (+)acute  condition, motivated, active ifestyle     PT Frequency  2x / week    PT Duration  8 weeks    PT Treatment/Interventions  Manual techniques;Patient/family education;Neuromuscular re-education;Therapeutic exercise;Moist Heat;Iontophoresis 4mg /ml Dexamethasone;Cryotherapy;Electrical Stimulation    PT Next Visit Plan  modalities for pain, spasms, manual therapy, exercise progression     PT Home Exercise Plan  posture awareness, log rolling for transfers, hip adduction with ball and glute sets; prone progression through press ups       Patient will benefit from skilled therapeutic intervention in order to improve the following deficits and impairments:  Decreased activity tolerance, Decreased endurance, Decreased strength,  Impaired perceived functional ability, Increased muscle spasms, Pain, Improper body mechanics  Visit Diagnosis: Acute left-sided low back pain with left-sided sciatica  Muscle weakness (generalized)  Muscle spasm of back     Problem List Patient Active Problem List   Diagnosis Date Noted  . Blood in the urine 08/13/2015  . H/O hyperthyroidism 08/13/2015  . H/O amblyopia 08/13/2015    Jomarie Longs PT 12/22/2017, 10:11 AM  [place] [facility] [address], [place], [address] Phone: [phone]   Fax:  [phone]  Name: Allison [name] MRN: [id number] Date of Birth: Oct 02, 1951

## 2017-12-23 ENCOUNTER — Encounter: Payer: Self-pay | Admitting: Physical Therapy

## 2017-12-23 ENCOUNTER — Ambulatory Visit: Payer: Medicare Other | Admitting: Physical Therapy

## 2017-12-23 DIAGNOSIS — M6281 Muscle weakness (generalized): Secondary | ICD-10-CM

## 2017-12-23 DIAGNOSIS — M5442 Lumbago with sciatica, left side: Secondary | ICD-10-CM

## 2017-12-23 DIAGNOSIS — M62830 Muscle spasm of back: Secondary | ICD-10-CM

## 2017-12-24 NOTE — Therapy (Signed)
[place] [facility] [address] [place], [address] Phone: [phone]   Fax:  [phone]  Physical Therapy Treatment  Patient Details  Name: Allison [name] MRN: [id number] Date of Birth: 09-18-1951 Referring Provider: Mar Daring PA-C   Encounter Date: 12/23/2017  PT End of Session - 12/23/17 1036    Visit Number  9    Number of Visits  16    Date for PT Re-Evaluation  01/20/18    PT Start Time  1033    PT Stop Time  1113    PT Time Calculation (min)  40 min    Activity Tolerance  Patient tolerated treatment well    Behavior During Therapy  [facility] for tasks assessed/performed       Past Medical History:  Diagnosis Date  . Medical history non-contributory   . Rosacea     Past Surgical History:  Procedure Laterality Date  . BREAST BIOPSY Left 2002   benign  . CATARACT EXTRACTION W/PHACO Right 07/06/2017   Procedure: CATARACT EXTRACTION PHACO AND INTRAOCULAR LENS PLACEMENT (IOC);  Surgeon: Birder Robson, MD;  Location: [facility] ORS;  Service: Ophthalmology;  Laterality: Right;  [place] 00:34.2 AP% 12.7 CDE 4.33 Fluid Pack lot # [id number] H  . NO PAST SURGERIES      There were no vitals filed for this visit.  Subjective Assessment - 12/23/17 1033    Subjective  Patient reports she is doing better and having difficultly with one exercise for scapular retraction     Pertinent History  08/2017 pain began for no apparent reason; went to chiropractor in October through present with some results; She went on vacation recently and did not have any pain and when she returned home the pain began again    Limitations  Sitting;Lifting;House hold activities;Other (comment) bending    How long can you sit comfortably?  10 min and depends on chair and using ice helps    How long can you stand comfortably?  as long as needs to    How long can you walk comfortably?  30 min at least    Diagnostic tests  X rays lumbar  spine    Patient Stated Goals  decrease pain in back and return to prior level of function; exercising; going up/down steps reciprocally; sleep without    Currently in Pain?  Yes    Pain Score  2     Pain Location  Back    Pain Orientation  Lower    Pain Descriptors / Indicators  Aching;Sore    Pain Type  Acute pain    Pain Onset  More than a month ago    Pain Frequency  Intermittent         Objective: Palpation: spasms palpable left side lumbar spine paraspinal muscles, mild to moderate in size, mild tenderness Outcome measure: MODI 16% (initially 26%)   Treatment: Manual therapy: 12 min goal: improved soft tissue elasticity, spasms, pain STM performed superficial techniques lumbar spine left side paraspiinal muscles with patient prone lying over1pillow, one pillow supporting LE's  Therapeutic exercise: patient performed with demonstration, verbal and tactile cues of therapist: goal: independent with home program, pain, improve MODI Re assessed with postioning demonstrated Standing at door frame with towel behind head and lumbar support scapular retraction, patient returned demonstration Planks on counter re assessed with patient demonstrating good technique/ alignment  Modalities: Electrical stimulation:18 min.:High volt estim.clincial program for muscle spasms (4) electrodes applied tobilateral lumbar spineintensity  to tolerance with patient in proneposition with pillow under pelvis and lower legsgoal: pain, spasms Ice pack applied to lower back while prone lying for inflammation/pain; no adverse effects noted  Patient response to treatment: patient demonstrated improved technique/alignment with exercises following demonstration. Improved soft tissue elasticity, decreased spasms to mild and decreased tenderness to mild/none following STM and modalities.       PT Education - 12/23/17 1035    Education provided  Yes    Education Details  re assess HEP for  scapular retraction at door frame; positioning feet away from wall to decrease strain on lower back    Person(s) Educated  Patient    Methods  Explanation;Demonstration;Verbal cues    Comprehension  Verbal cues required;Returned demonstration;Verbalized understanding          PT Long Term Goals - 12/23/17 1053      PT LONG TERM GOAL #1   Title   Patient will demonstrate improved function with daily tasks and decreased back pain to improve stair climbing and transfers in/out of car as indicated by MODI score of 15%    Baseline  MODI 26%; 12/23/17 MODI 16%     Status  Partially Met      PT LONG TERM GOAL #2   Title   Patient will demonstrate improved posture awareness and pain control strategies to allow patient to sit for >30  min. with minimal difficulty/pain    Baseline  10 min. sitting ; continues with difficulty with prolonged sitting >2 hours    Status  Achieved      PT LONG TERM GOAL #3   Title  Patient will be independent with home program for posture awareness, pain control, progressive exercises to allow patient to transition to self management once discharged from physical therapy    Baseline  limited knowledge of pain control strategies, exercises or progression without guidance, instruction and cuing    Status  On-going    Target Date  01/20/18      PT LONG TERM GOAL #4   Title   Patient will demonstrate improved function with daily tasks and decreased back pain as indicated by MODI score of 10% or less    Baseline  MODI 26%; MODI 12/23/17 16%    Status  On-going    Target Date  01/20/18            Plan - 12/23/17 1050    Clinical Impression Statement  Patient demonstrates good progress towards goals with MODI score improved to 16% from 26% and progressing towards independence with home exercises and pain control. she will be away for 2 weeks and on return will re assess for further physical therapy needs.     Rehab Potential  Good    Clinical Impairments Affecting  Rehab Potential  (+)acute condition, motivated, active ifestyle     PT Frequency  2x / week    PT Duration  8 weeks    PT Treatment/Interventions  Manual techniques;Patient/family education;Neuromuscular re-education;Therapeutic exercise;Moist Heat;Iontophoresis 60m/ml Dexamethasone;Cryotherapy;Electrical Stimulation    PT Next Visit Plan  modalities for pain, spasms, manual therapy, exercise progression     PT Home Exercise Plan  posture awareness, log rolling for transfers, hip adduction with ball and glute sets; prone progression through press ups       Patient will benefit from skilled therapeutic intervention in order to improve the following deficits and impairments:  Decreased activity tolerance, Decreased endurance, Decreased strength, Impaired perceived functional ability, Increased muscle spasms, Pain, Improper  body mechanics  Visit Diagnosis: Acute left-sided low back pain with left-sided sciatica  Muscle weakness (generalized)  Muscle spasm of back     Problem List Patient Active Problem List   Diagnosis Date Noted  . Blood in the urine 08/13/2015  . H/O hyperthyroidism 08/13/2015  . H/O amblyopia 08/13/2015    Jomarie Longs PT 12/24/2017, 10:57 AM  [place] [facility] [address] [place], [address] Phone: [phone]   Fax:  [phone]  Name: Allison [name] MRN: [id number] Date of Birth: 1951-01-02

## 2018-01-10 ENCOUNTER — Ambulatory Visit: Payer: Medicare Other | Admitting: Physical Therapy

## 2018-01-10 ENCOUNTER — Other Ambulatory Visit: Payer: Self-pay

## 2018-01-10 ENCOUNTER — Encounter: Payer: Self-pay | Admitting: Physical Therapy

## 2018-01-10 DIAGNOSIS — M5442 Lumbago with sciatica, left side: Secondary | ICD-10-CM

## 2018-01-10 DIAGNOSIS — M62830 Muscle spasm of back: Secondary | ICD-10-CM

## 2018-01-10 DIAGNOSIS — M6281 Muscle weakness (generalized): Secondary | ICD-10-CM

## 2018-01-10 NOTE — Therapy (Signed)
[facility] [address], [place], [address] Phone: [phone]   Fax:  [phone]  Physical Therapy Treatment  Patient Details  Name: Allison [name] MRN: [id number] Date of Birth: 12-28-1950 Referring Provider: Mar Daring PA-C   Encounter Date: 01/10/2018  PT End of Session - 01/10/18 0939    Visit Number  10    Number of Visits  16    Date for PT Re-Evaluation  01/20/18    PT Start Time  0934    PT Stop Time  1017    PT Time Calculation (min)  43 min    Activity Tolerance  Patient tolerated treatment well    Behavior During Therapy  [facility] for tasks assessed/performed       Past Medical History:  Diagnosis Date  . Medical history non-contributory   . Rosacea     Past Surgical History:  Procedure Laterality Date  . BREAST BIOPSY Left 2002   benign  . CATARACT EXTRACTION W/PHACO Right 07/06/2017   Procedure: CATARACT EXTRACTION PHACO AND INTRAOCULAR LENS PLACEMENT (IOC);  Surgeon: Birder Robson, MD;  Location: [facility] ORS;  Service: Ophthalmology;  Laterality: Right;  [place] 00:34.2 AP% 12.7 CDE 4.33 Fluid Pack lot # [id number]  . NO PAST SURGERIES      There were no vitals filed for this visit.  Subjective Assessment - 01/10/18 0936    Subjective  Patient reports she did well while away for 2 weeks in February and aggravated her pain due to long plane ride and back to work with sitting. Today she is better and feels her exercises are going well.     Pertinent History  08/2017 pain began for no apparent reason; went to chiropractor in October through present with some results; She went on vacation recently and did not have any pain and when she returned home the pain began again    Limitations  Sitting;Lifting;House hold activities;Other (comment) bending    How long can you sit comfortably?  10 min and depends on chair and using ice helps    How long can you stand comfortably?  as long as  needs to    How long can you walk comfortably?  30 min at least    Diagnostic tests  X rays lumbar spine    Patient Stated Goals  decrease pain in back and return to prior level of function; exercising; going up/down steps reciprocally; sleep without    Currently in Pain?  Yes    Pain Score  2  yesterday PM was a 4/10 and aching    Pain Location  Back    Pain Orientation  Lower    Pain Descriptors / Indicators  Aching    Pain Type  Acute pain    Pain Onset  More than a month ago    Pain Frequency  Intermittent          Objective: Palpation:mild to moderate spasms and tenderness elicited bilateral lumbar spine paraspinal muscles  Treatment: Manual therapy:23 min goal: improved soft tissue elasticity, spasms, pain STM performed superficial techniques lumbar spine left and right  side paraspiinal muscles with patient prone lying over1pillow, one pillow supporting LE's  Modalities: Electrical stimulation:15 min.:High volt estim.clincial program for muscle spasms (4) electrodes applied tobilateral lumbar spineintensity to tolerance with patient in proneposition with pillow under pelvis and lower legsgoal: pain, spasms Ice pack applied to lower back while prone lying for inflammation/pain; no adverse effects noted  Patient response to treatment: Patient demonstrated improved soft tissue elasticity and decreased spasms by up to 50% and pain decreased to <1/10.     PT Education - 01/10/18 [id number]    Education provided  Yes    Education Details  re assessed HEP     Person(s) Educated  Patient    Methods  Explanation    Comprehension  Verbalized understanding          PT Long Term Goals - 12/23/17 1053      PT LONG TERM GOAL #1   Title   Patient will demonstrate improved function with daily tasks and decreased back pain to improve stair climbing and transfers in/out of car as indicated by MODI score of 15%    Baseline  MODI 26%; 12/23/17 MODI 16%     Status   Partially Met      PT LONG TERM GOAL #2   Title   Patient will demonstrate improved posture awareness and pain control strategies to allow patient to sit for >30  min. with minimal difficulty/pain    Baseline  10 min. sitting ; continues with difficulty with prolonged sitting >2 hours    Status  Achieved      PT LONG TERM GOAL #3   Title  Patient will be independent with home program for posture awareness, pain control, progressive exercises to allow patient to transition to self management once discharged from physical therapy    Baseline  limited knowledge of pain control strategies, exercises or progression without guidance, instruction and cuing    Status  On-going    Target Date  01/20/18      PT LONG TERM GOAL #4   Title   Patient will demonstrate improved function with daily tasks and decreased back pain as indicated by MODI score of 10% or less    Baseline  MODI 26%; MODI 12/23/17 16%    Status  On-going    Target Date  01/20/18            Plan - 01/10/18 1011    Clinical Impression Statement  Patient is progressing with goals with improvement with decreasing pain and able to self manage symptoms with miniminal guidance.     Rehab Potential  Good    Clinical Impairments Affecting Rehab Potential  (+)acute condition, motivated, active ifestyle     PT Frequency  2x / week    PT Duration  8 weeks    PT Treatment/Interventions  Manual techniques;Patient/family education;Neuromuscular re-education;Therapeutic exercise;Moist Heat;Iontophoresis '4mg'$ /ml Dexamethasone;Cryotherapy;Electrical Stimulation    PT Next Visit Plan  modalities for pain, spasms, manual therapy, exercise progression     PT Home Exercise Plan  posture awareness, log rolling for transfers, hip adduction with ball and glute sets; prone progression through press ups       Patient will benefit from skilled therapeutic intervention in order to improve the following deficits and impairments:  Decreased activity  tolerance, Decreased endurance, Decreased strength, Impaired perceived functional ability, Increased muscle spasms, Pain, Improper body mechanics  Visit Diagnosis: Acute left-sided low back pain with left-sided sciatica  Muscle weakness (generalized)  Muscle spasm of back     Problem List Patient Active Problem List   Diagnosis Date Noted  . Blood in the urine 08/13/2015  . H/O hyperthyroidism 08/13/2015  . H/O amblyopia 08/13/2015    Jomarie Longs PT 01/10/2018, 10:22 AM  [facility] [address], [place], [address] Phone: [phone]  Fax:  [phone]  Name: Allison [name] MRN: [id number] Date of Birth: Dec 27, 1950

## 2018-01-12 ENCOUNTER — Encounter: Payer: Self-pay | Admitting: Physical Therapy

## 2018-01-12 ENCOUNTER — Ambulatory Visit: Payer: Medicare Other | Admitting: Physical Therapy

## 2018-01-12 DIAGNOSIS — M6281 Muscle weakness (generalized): Secondary | ICD-10-CM

## 2018-01-12 DIAGNOSIS — M62830 Muscle spasm of back: Secondary | ICD-10-CM

## 2018-01-12 DIAGNOSIS — M5442 Lumbago with sciatica, left side: Secondary | ICD-10-CM | POA: Diagnosis not present

## 2018-01-12 NOTE — Therapy (Signed)
[facility] [address] [place], [address] Phone: [phone]   Fax:  [phone]  Physical Therapy Treatment  Patient Details  Name: Allison [name] MRN: [id number] Date of Birth: [birth year] Referring Provider: Mar Daring PA-C   Encounter Date: 01/12/2018  PT End of Session - 01/12/18 1031    Visit Number  11    Number of Visits  16    Date for PT Re-Evaluation  01/20/18    PT Start Time  0948    PT Stop Time  1031    PT Time Calculation (min)  43 min    Activity Tolerance  Patient tolerated treatment well    Behavior During Therapy  [facility] for tasks assessed/performed       Past Medical History:  Diagnosis Date  . Medical history non-contributory   . Rosacea     Past Surgical History:  Procedure Laterality Date  . BREAST BIOPSY Left 2002   benign  . CATARACT EXTRACTION W/PHACO Right 07/06/2017   Procedure: CATARACT EXTRACTION PHACO AND INTRAOCULAR LENS PLACEMENT (IOC);  Surgeon: Birder Robson, MD;  Location: [facility] ORS;  Service: Ophthalmology;  Laterality: Right;  [place] 00:34.2 AP% 12.7 CDE 4.33 Fluid Pack lot # [id number] H  . NO PAST SURGERIES      There were no vitals filed for this visit.  Subjective Assessment - 01/12/18 1031    Subjective  Patient reports improving still with intermittent spasms in lower back and still having problems with forward bending activities such as reaching into lower cabinets.     Pertinent History  08/2017 pain began for no apparent reason; went to chiropractor in October through present with some results; She went on vacation recently and did not have any pain and when she returned home the pain began again    Limitations  Sitting;Lifting;House hold activities;Other (comment) bending    How long can you sit comfortably?  10 min and depends on chair and using ice helps    How long can you stand comfortably?  as long as needs to    How long can you walk  comfortably?  30 min at least    Diagnostic tests  X rays lumbar spine    Patient Stated Goals  decrease pain in back and return to prior level of function; exercising; going up/down steps reciprocally; sleep without    Currently in Pain?  Yes    Pain Score  2     Pain Location  Back    Pain Orientation  Lower;Left    Pain Descriptors / Indicators  Aching    Pain Type  Acute pain    Pain Onset  More than a month ago    Pain Frequency  Intermittent         Objective: Palpation:mild spasms left side paraspinal muscles thoracic to lumbar spine left >right AROM: lumbar spine: 50% decrease flexion, 25% decrease extension; side bending left/right equal and non painful Outcome measure: MODI 18% (slight increase from prior assessment before patient went on vacation 2 weeks ago)  Treatment: Therapeutic exercise: patient performed following demonstration and with verbal cues of therapist: Single leg dead lifts x 10 reps each LE with UE support on counter Chair squats off increased height `25" x 5 reps  Manual therapy:88mn goal: improved soft tissue elasticity, spasms, pain STM performed superficial techniques lumbar spineleft and right  side paraspiinal muscleswith patient prone lying over1pillow, one pillow supporting LE's  Modalities: Electrical stimulation:148m.:High  volt estim.clincial program for muscle spasms (4) electrodes applied tobilateral lumbar spineintensity to tolerance with patient in proneposition with pillow under pelvis and lower legsgoal: pain, spasms Ice pack applied to lower back while prone lying for inflammation/pain; no adverse effects noted  Patient response to treatment: Patient reported significant decrease in pain to <1/10 and improved technique with exercises with moderate cuing and repeated demonstration. .       PT Education - 01/12/18 1436    Education provided  Yes    Education Details  exercise instruction dead lifts and chair  squats    Person(s) Educated  Patient    Methods  Explanation;Demonstration;Verbal cues    Comprehension  Verbalized understanding;Returned demonstration;Verbal cues required          PT Long Term Goals - 12/23/17 1053      PT LONG TERM GOAL #1   Title   Patient will demonstrate improved function with daily tasks and decreased back pain to improve stair climbing and transfers in/out of car as indicated by MODI score of 15%    Baseline  MODI 26%; 12/23/17 MODI 16%     Status  Partially Met      PT LONG TERM GOAL #2   Title   Patient will demonstrate improved posture awareness and pain control strategies to allow patient to sit for >30  min. with minimal difficulty/pain    Baseline  10 min. sitting ; continues with difficulty with prolonged sitting >2 hours    Status  Achieved      PT LONG TERM GOAL #3   Title  Patient will be independent with home program for posture awareness, pain control, progressive exercises to allow patient to transition to self management once discharged from physical therapy    Baseline  limited knowledge of pain control strategies, exercises or progression without guidance, instruction and cuing    Status  On-going    Target Date  01/20/18      PT LONG TERM GOAL #4   Title   Patient will demonstrate improved function with daily tasks and decreased back pain as indicated by MODI score of 10% or less    Baseline  MODI 26%; MODI 12/23/17 16%    Status  On-going    Target Date  01/20/18            Plan - 01/12/18 1437    Clinical Impression Statement  Patient is progressing well towards goals. She has intermittent symptoms in lower back and left LE now and is advancing exercises for LE and core strength. She will benefit from continued physical therapy intervention to achieve goals .    Rehab Potential  Good    Clinical Impairments Affecting Rehab Potential  (+)acute condition, motivated, active ifestyle     PT Frequency  2x / week    PT Duration  8  weeks    PT Treatment/Interventions  Manual techniques;Patient/family education;Neuromuscular re-education;Therapeutic exercise;Moist Heat;Iontophoresis 69m/ml Dexamethasone;Cryotherapy;Electrical Stimulation    PT Next Visit Plan  modalities for pain, spasms, manual therapy, exercise progression     PT Home Exercise Plan  posture awareness, log rolling for transfers, hip adduction with ball and glute sets; prone progression through press ups       Patient will benefit from skilled therapeutic intervention in order to improve the following deficits and impairments:  Decreased activity tolerance, Decreased endurance, Decreased strength, Impaired perceived functional ability, Increased muscle spasms, Pain, Improper body mechanics  Visit Diagnosis: Acute left-sided low back pain with  left-sided sciatica  Muscle weakness (generalized)  Muscle spasm of back     Problem List Patient Active Problem List   Diagnosis Date Noted  . Blood in the urine 08/13/2015  . H/O hyperthyroidism 08/13/2015  . H/O amblyopia 08/13/2015    Jomarie Longs PT 01/12/2018, 4:41 PM  [facility] [address], [place], [address] Phone: [phone]   Fax:  [phone]  Name: Nettye Flegal MRN: [id number] Date of Birth: 12-22-1950

## 2018-01-13 ENCOUNTER — Encounter: Payer: Medicare Other | Admitting: Physical Therapy

## 2018-01-17 ENCOUNTER — Encounter: Payer: Self-pay | Admitting: Physical Therapy

## 2018-01-17 ENCOUNTER — Ambulatory Visit: Payer: Medicare Other | Attending: Physician Assistant | Admitting: Physical Therapy

## 2018-01-17 DIAGNOSIS — M62830 Muscle spasm of back: Secondary | ICD-10-CM

## 2018-01-17 DIAGNOSIS — M5442 Lumbago with sciatica, left side: Secondary | ICD-10-CM

## 2018-01-17 DIAGNOSIS — M6281 Muscle weakness (generalized): Secondary | ICD-10-CM | POA: Diagnosis present

## 2018-01-17 NOTE — Therapy (Signed)
[facility] [address] [place], [address] Phone: [phone]   Fax:  [phone]  Physical Therapy Treatment  Patient Details  Name: Allison [name] MRN: [id number] Date of Birth: 1951-02-29 Referring Provider: Mar Daring PA-C   Encounter Date: 01/17/2018  PT End of Session - 01/17/18 0934    Visit Number  12    Number of Visits  16    Date for PT Re-Evaluation  01/20/18    PT Start Time  0931    PT Stop Time  1018    PT Time Calculation (min)  47 min    Activity Tolerance  Patient tolerated treatment well    Behavior During Therapy  [facility] for tasks assessed/performed       Past Medical History:  Diagnosis Date  . Medical history non-contributory   . Rosacea     Past Surgical History:  Procedure Laterality Date  . BREAST BIOPSY Left 2002   benign  . CATARACT EXTRACTION W/PHACO Right 07/06/2017   Procedure: CATARACT EXTRACTION PHACO AND INTRAOCULAR LENS PLACEMENT (IOC);  Surgeon: Birder Robson, MD;  Location: [facility] ORS;  Service: Ophthalmology;  Laterality: Right;  [place] 00:34.2 AP% 12.7 CDE 4.33 Fluid Pack lot # [id number] H  . NO PAST SURGERIES      There were no vitals filed for this visit.  Subjective Assessment - 01/17/18 0932    Subjective  Patient reports she is doing well today and is noticing that changing her posture with lifting (goler's bend) is helping.     Pertinent History  08/2017 pain began for no apparent reason; went to chiropractor in October through present with some results; She went on vacation recently and did not have any pain and when she returned home the pain began again    Limitations  Sitting;Lifting;House hold activities;Other (comment) bending    How long can you sit comfortably?  10 min and depends on chair and using ice helps    How long can you stand comfortably?  as long as needs to    How long can you walk comfortably?  30 min at least    Diagnostic  tests  X rays lumbar spine    Patient Stated Goals  decrease pain in back and return to prior level of function; exercising; going up/down steps reciprocally; sleep without    Currently in Pain?  No/denies         Objective: Palpation:mild spasms left side paraspinal muscles thoracic to lumbar spine left >right  Treatment: Therapeutic exercise: patient performed following demonstration and with verbal cues of therapist: Single leg dead lifts x 5 reps each LE with UE support on counter Chair squats off increased height `25" x 3 reps Re assessed planks off counter, treatment table and wall x 2 reps with minimal VC   Manual therapy:41mn goal: improved soft tissue elasticity, spasms, pain STM performed superficial techniques lumbar spineleftand rightside paraspiinal muscleswith patient prone lying over1pillow, one pillow supporting LE's  Modalities: Electrical stimulation:258m.:High volt estim.clincial program for muscle spasms (4) electrodes applied tobilateral lumbar spineintensity to tolerance with patient in proneposition with pillow under pelvis and lower legsgoal: pain, spasms Ice pack applied to lower back while prone lying for inflammation/pain; no adverse effects noted  Patient response to treatment: Patient demonstrates significant decrease in spasms in left side lumbar spine paraspinal muscles and is progressing well with exercise with minimal cuing required.      PT Education - 01/17/18 094315  Education provided  Yes    Education Details  exercise instruction and re assessed HEP    Person(s) Educated  Patient    Methods  Explanation;Demonstration;Verbal cues    Comprehension  Verbalized understanding;Returned demonstration;Verbal cues required          PT Long Term Goals - 12/23/17 1053      PT LONG TERM GOAL #1   Title   Patient will demonstrate improved function with daily tasks and decreased back pain to improve stair climbing and  transfers in/out of car as indicated by MODI score of 15%    Baseline  MODI 26%; 12/23/17 MODI 16%     Status  Partially Met      PT LONG TERM GOAL #2   Title   Patient will demonstrate improved posture awareness and pain control strategies to allow patient to sit for >30  min. with minimal difficulty/pain    Baseline  10 min. sitting ; continues with difficulty with prolonged sitting >2 hours    Status  Achieved      PT LONG TERM GOAL #3   Title  Patient will be independent with home program for posture awareness, pain control, progressive exercises to allow patient to transition to self management once discharged from physical therapy    Baseline  limited knowledge of pain control strategies, exercises or progression without guidance, instruction and cuing    Status  On-going    Target Date  01/20/18      PT LONG TERM GOAL #4   Title   Patient will demonstrate improved function with daily tasks and decreased back pain as indicated by MODI score of 10% or less    Baseline  MODI 26%; MODI 12/23/17 16%    Status  On-going    Target Date  01/20/18            Plan - 01/17/18 1006    Clinical Impression Statement  Patient continues to progress steadily towards goals with decreasing spasms and pain in lower back and improving function at home with daily tasks. She should continue to progress with additional physical therapy intervention to advance exercises and further decrease spasms to allow transition to indpendent home program, self management.     Rehab Potential  Good    Clinical Impairments Affecting Rehab Potential  (+)acute condition, motivated, active ifestyle     PT Frequency  2x / week    PT Duration  8 weeks    PT Treatment/Interventions  Manual techniques;Patient/family education;Neuromuscular re-education;Therapeutic exercise;Moist Heat;Iontophoresis '4mg'$ /ml Dexamethasone;Cryotherapy;Electrical Stimulation    PT Next Visit Plan  modalities for pain, spasms, manual therapy,  exercise progression     PT Home Exercise Plan  posture awareness, log rolling for transfers, hip adduction with ball and glute sets; prone progression through press ups       Patient will benefit from skilled therapeutic intervention in order to improve the following deficits and impairments:  Decreased activity tolerance, Decreased endurance, Decreased strength, Impaired perceived functional ability, Increased muscle spasms, Pain, Improper body mechanics  Visit Diagnosis: Acute left-sided low back pain with left-sided sciatica  Muscle weakness (generalized)  Muscle spasm of back     Problem List Patient Active Problem List   Diagnosis Date Noted  . Blood in the urine 08/13/2015  . H/O hyperthyroidism 08/13/2015  . H/O amblyopia 08/13/2015    Jomarie Longs PT 01/17/2018, 11:07 AM  [facility] [address], [place], [address]  Phone: [phone]   Fax:  [phone]  Name: Magda Muise MRN: [id number] Date of Birth: Nov 16, 1951

## 2018-01-20 ENCOUNTER — Encounter: Payer: Medicare Other | Admitting: Physical Therapy

## 2018-01-24 ENCOUNTER — Encounter: Payer: Self-pay | Admitting: Physical Therapy

## 2018-01-24 ENCOUNTER — Ambulatory Visit: Payer: Medicare Other | Admitting: Physical Therapy

## 2018-01-24 DIAGNOSIS — M5442 Lumbago with sciatica, left side: Secondary | ICD-10-CM | POA: Diagnosis not present

## 2018-01-24 DIAGNOSIS — M6281 Muscle weakness (generalized): Secondary | ICD-10-CM

## 2018-01-24 DIAGNOSIS — M62830 Muscle spasm of back: Secondary | ICD-10-CM

## 2018-01-24 NOTE — Therapy (Signed)
[facility] [address], [place], [address] Phone: [phone]   Fax:  [phone]  Physical Therapy Treatment  Patient Details  Name: Allison [name] MRN: [id number] Date of Birth: 04-24-1951 Referring Provider: Mar Daring PA-C   Encounter Date: 01/24/2018  PT End of Session - 01/24/18 0956    Visit Number  13    Number of Visits  17    Date for PT Re-Evaluation  02/21/18    PT Start Time  0925    PT Stop Time  1011    PT Time Calculation (min)  46 min    Activity Tolerance  Patient tolerated treatment well    Behavior During Therapy  [facility] for tasks assessed/performed       Past Medical History:  Diagnosis Date  . Medical history non-contributory   . Rosacea     Past Surgical History:  Procedure Laterality Date  . BREAST BIOPSY Left 2002   benign  . CATARACT EXTRACTION W/PHACO Right 07/06/2017   Procedure: CATARACT EXTRACTION PHACO AND INTRAOCULAR LENS PLACEMENT (IOC);  Surgeon: Birder Robson, MD;  Location: [facility] ORS;  Service: Ophthalmology;  Laterality: Right;  [place] 00:34.2 AP% 12.7 CDE 4.33 Fluid Pack lot # [id number]  . NO PAST SURGERIES      There were no vitals filed for this visit.  Subjective Assessment - 01/24/18 0927    Subjective  Patient reports she is doing better and continues to have difficulty with sleeping due to her back pain waking her up if she turns over in bed.     Pertinent History  08/2017 pain began for no apparent reason; went to chiropractor in October through present with some results; She went on vacation recently and did not have any pain and when she returned home the pain began again    Limitations  Sitting;Lifting;House hold activities;Other (comment) bending    How long can you sit comfortably?  10 min and depends on chair and using ice helps    How long can you stand comfortably?  as long as needs to    How long can you walk comfortably?  30 min at  least    Diagnostic tests  X rays lumbar spine    Patient Stated Goals  decrease pain in back and return to prior level of function; exercising; going up/down steps reciprocally; sleep without    Currently in Pain?  No/denies         [facility] PT Assessment - 01/24/18 0001      Assessment   Medical Diagnosis  Acute left sided low back pain with left sided sciatica    Onset Date/Surgical Date  08/16/17      Observation/Other Assessments   Modified [place]  16%      Objective: Palpation:mild spasms left > right lower thoracic/lumbar paraspinal muscles MODI: 16% AROM: lumbar spine: flexion mild limitations with increased right sided back discomfort with repeated motion, Extension mild limitations without increased symptoms. Lateral flexion right and left WNL and equal bilaterally without reproduction of symptoms  Treatment: Therapeutic exercise: patient performed following demonstration and with verbal cues of therapist: Patient demonstrated following therapist demonstration: Single leg dead lifts x 2 reps each LE with UE support on counter Re assessed planks off counter, treatment table and wall   Manual therapy:90mn goal: improved soft tissue elasticity, spasms, pain STM performed superficial techniques lumbar spineleftand rightside paraspiinal muscleswith patient prone lying over1pillow, one pillow supporting LE's  Modalities:  Electrical stimulation:33mn.:High volt estim.clincial program for muscle spasms (4) electrodes applied tobilateral lumbar spineintensity to tolerance with patient in proneposition with pillow under pelvis and lower legsgoal: pain, spasms Ice pack applied to lower back while prone lying for inflammation/pain; no adverse effects noted  Patient response to treatment:Patient demonstrated improved soft tissue elasticity with decreased spasms and tenderness to mild and able to transfer off table and walk with less stiffness reported.         PT Education - 01/24/18 0927    Education provided  Yes    Education Details  exercise insruction and body mechanics re assessed    Person(s) Educated  Patient    Methods  Explanation;Demonstration;Verbal cues    Comprehension  Verbalized understanding;Returned demonstration;Verbal cues required          PT Long Term Goals - 01/24/18 1003      PT LONG TERM GOAL #1   Title   Patient will demonstrate improved function with daily tasks and decreased back pain to improve stair climbing and transfers in/out of car as indicated by MODI score of 15%    Baseline  MODI 26%; 12/23/17 MODI 16%; 01/24/18 16%     Status  Partially Met      PT LONG TERM GOAL #2   Title   Patient will demonstrate improved posture awareness and pain control strategies to allow patient to sit for >30  min. with minimal difficulty/pain    Baseline  10 min. sitting ; continues with difficulty with prolonged sitting >2 hours    Status  Achieved      PT LONG TERM GOAL #3   Title  Patient will be independent with home program for posture awareness, pain control, progressive exercises to allow patient to transition to self management once discharged from physical therapy    Baseline  limited knowledge of pain control strategies, exercises or progression without guidance, instruction and cuing    Status  On-going    Target Date  02/21/18      PT LONG TERM GOAL #4   Title   Patient will demonstrate improved function with daily tasks and decreased back pain and improvement with sleeping with mild/no symtpoms as indicated by MODI score of 10% or less    Baseline  MODI 26%; MODI 12/23/17 16%    Status  On-going    Target Date  02/21/18            Plan - 01/24/18 0958    Clinical Impression Statement  Patient continues to progress steadily towards goals with decreasing spasms and pain in lower back and is improving function with sitting and driving. she continues with exacerbation of syptoms with bending and  prolonged sitting activities. She has MODI score of 16% and has sleeping difficulty every night with back pain. She will benefit from physical therapy intervention as she transitions back to full work schedule and home program for self managment.     Rehab Potential  Good    Clinical Impairments Affecting Rehab Potential  (+)acute condition, motivated, active ifestyle     PT Frequency  1x / week    PT Duration  Other (comment) 5 weeks    PT Treatment/Interventions  Manual techniques;Patient/family education;Neuromuscular re-education;Therapeutic exercise;Moist Heat;Iontophoresis '4mg'$ /ml Dexamethasone;Cryotherapy;Electrical Stimulation    PT Next Visit Plan  modalities for pain, spasms, manual therapy, exercise progression     PT Home Exercise Plan  posture awareness, log rolling for transfers, hip adduction with ball and glute sets; prone progression through  press ups; resistive band walking, planks off wall, counter, scapular rows for stabilization    Consulted and Agree with Plan of Care  Patient       Patient will benefit from skilled therapeutic intervention in order to improve the following deficits and impairments:  Decreased activity tolerance, Decreased endurance, Decreased strength, Impaired perceived functional ability, Increased muscle spasms, Pain, Improper body mechanics  Visit Diagnosis: Acute left-sided low back pain with left-sided sciatica - Plan: PT plan of care cert/re-cert  Muscle weakness (generalized) - Plan: PT plan of care cert/re-cert  Muscle spasm of back - Plan: PT plan of care cert/re-cert     Problem List Patient Active Problem List   Diagnosis Date Noted  . Blood in the urine 08/13/2015  . H/O hyperthyroidism 08/13/2015  . H/O amblyopia 08/13/2015    Jomarie Longs PT 01/24/2018, 1:28 PM  [facility] [address] [place], [address] Phone: [phone]   Fax:  [phone]  Name:  Allison [name] MRN: [id number] Date of Birth: 25-Mar-1951

## 2018-01-27 ENCOUNTER — Encounter: Payer: Medicare Other | Admitting: Physical Therapy

## 2018-02-01 ENCOUNTER — Encounter: Payer: Self-pay | Admitting: Physical Therapy

## 2018-02-01 ENCOUNTER — Ambulatory Visit: Payer: Medicare Other | Admitting: Physical Therapy

## 2018-02-01 DIAGNOSIS — M5442 Lumbago with sciatica, left side: Secondary | ICD-10-CM | POA: Diagnosis not present

## 2018-02-01 DIAGNOSIS — M62830 Muscle spasm of back: Secondary | ICD-10-CM

## 2018-02-01 DIAGNOSIS — M6281 Muscle weakness (generalized): Secondary | ICD-10-CM

## 2018-02-01 NOTE — Therapy (Signed)
[facility] [address] [place], [address] Phone: [phone]   Fax:  [phone]  Physical Therapy Treatment  Patient Details  Name: Allison [name] MRN: [id number] Date of Birth: Apr 12, 1951 Referring Provider: Mar Daring PA-C   Encounter Date: 02/01/2018  PT End of Session - 02/01/18 0955    Visit Number  14    Number of Visits  17    Date for PT Re-Evaluation  02/21/18    PT Start Time  0950    PT Stop Time  1040    PT Time Calculation (min)  50 min    Activity Tolerance  Patient tolerated treatment well    Behavior During Therapy  [facility] for tasks assessed/performed       Past Medical History:  Diagnosis Date  . Medical history non-contributory   . Rosacea     Past Surgical History:  Procedure Laterality Date  . BREAST BIOPSY Left 2002   benign  . CATARACT EXTRACTION W/PHACO Right 07/06/2017   Procedure: CATARACT EXTRACTION PHACO AND INTRAOCULAR LENS PLACEMENT (IOC);  Surgeon: Birder Robson, MD;  Location: [facility] ORS;  Service: Ophthalmology;  Laterality: Right;  [place] 00:34.2 AP% 12.7 CDE 4.33 Fluid Pack lot # [id number] H  . NO PAST SURGERIES      There were no vitals filed for this visit.  Subjective Assessment - 02/01/18 0951    Subjective  Patient reports she had increased pain in lower back with working and standing on feet for extended periods of time. Today she is having pain in lower back and is concerned with exacerbation of symptoms    Pertinent History  08/2017 pain began for no apparent reason; went to chiropractor in October through present with some results; She went on vacation recently and did not have any pain and when she returned home the pain began again    Limitations  Sitting;Lifting;House hold activities;Other (comment) bending    How long can you sit comfortably?  10 min and depends on chair and using ice helps    How long can you stand comfortably?  as long as  needs to    How long can you walk comfortably?  30 min at least    Diagnostic tests  X rays lumbar spine    Patient Stated Goals  decrease pain in back and return to prior level of function; exercising; going up/down steps reciprocally; sleep without    Currently in Pain?  Yes    Pain Score  2     Pain Location  Back    Pain Orientation  Lower    Pain Type  Acute pain    Pain Onset  More than a month ago    Pain Frequency  Intermittent       Objective:  Palpation: mild spasms right and left lower thoracic/lumbar paraspinal muscles Posture: WFL   Treatment: Therapeutic exercise: patient performed following demonstration and with verbal cues of therapist: OMEGA:  seated rows 10# 2 x 15 standing straight arm pull downs 15# x 15 reps standing scapular retraction bilateral 15# x 15 standing modified palloff press 15# x 15 reps (facing column)    Manual therapy: 10 min goal: improved soft tissue elasticity, spasms, pain STM performed superficial techniques lumbar spine left and right  side paraspiinal muscles with patient prone lying over 1 pillow, one pillow supporting LE's     Modalities: Electrical stimulation: 20 min.: High volt estim.clincial program for muscle spasms  (  4) electrodes applied to bilateral lumbar spine intensity to tolerance with patient in prone position with pillow under pelvis and lower legs  goal: pain, spasms Ice pack applied to lower back while prone lying for inflammation/pain; no adverse effects noted   Patient response to treatment: improved technique with exercises with repeated demonstration and verbal cuing. decreased spasms and improved soft tissue elasticity by 50% following STM.      PT Education - 02/01/18 1034    Education provided  Yes    Education Details  exercise instruction for technique    Person(s) Educated  Patient    Methods  Explanation;Demonstration;Verbal cues    Comprehension  Verbalized understanding;Returned demonstration;Verbal  cues required          PT Long Term Goals - 01/24/18 1003      PT LONG TERM GOAL #1   Title   Patient will demonstrate improved function with daily tasks and decreased back pain to improve stair climbing and transfers in/out of car as indicated by MODI score of 15%    Baseline  MODI 26%; 12/23/17 MODI 16%; 01/24/18 16%     Status  Partially Met      PT LONG TERM GOAL #2   Title   Patient will demonstrate improved posture awareness and pain control strategies to allow patient to sit for >30  min. with minimal difficulty/pain    Baseline  10 min. sitting ; continues with difficulty with prolonged sitting >2 hours    Status  Achieved      PT LONG TERM GOAL #3   Title  Patient will be independent with home program for posture awareness, pain control, progressive exercises to allow patient to transition to self management once discharged from physical therapy    Baseline  limited knowledge of pain control strategies, exercises or progression without guidance, instruction and cuing    Status  On-going    Target Date  02/21/18      PT LONG TERM GOAL #4   Title   Patient will demonstrate improved function with daily tasks and decreased back pain and improvement with sleeping with mild/no symtpoms as indicated by MODI score of 10% or less    Baseline  MODI 26%; MODI 12/23/17 16%    Status  On-going    Target Date  02/21/18            Plan - 02/01/18 1035    Clinical Impression Statement  Patient is progressing with core strengthening exercises and is progressing well towards goals. She continues with increased symptoms with prolonged standing.     Rehab Potential  Good    Clinical Impairments Affecting Rehab Potential  (+)acute condition, motivated, active ifestyle     PT Frequency  1x / week    PT Duration  Other (comment) 5 weeks    PT Treatment/Interventions  Manual techniques;Patient/family education;Neuromuscular re-education;Therapeutic exercise;Moist Heat;Iontophoresis 46m/ml  Dexamethasone;Cryotherapy;Electrical Stimulation    PT Next Visit Plan  modalities for pain, spasms, manual therapy, exercise progression     PT Home Exercise Plan  posture awareness, log rolling for transfers, hip adduction with ball and glute sets; prone progression through press ups; resistive band walking, planks off wall, counter, scapular rows for stabilization       Patient will benefit from skilled therapeutic intervention in order to improve the following deficits and impairments:  Decreased activity tolerance, Decreased endurance, Decreased strength, Impaired perceived functional ability, Increased muscle spasms, Pain, Improper body mechanics  Visit Diagnosis: Acute left-sided low  back pain with left-sided sciatica  Muscle weakness (generalized)  Muscle spasm of back     Problem List Patient Active Problem List   Diagnosis Date Noted  . Blood in the urine 08/13/2015  . H/O hyperthyroidism 08/13/2015  . H/O amblyopia 08/13/2015    Jomarie Longs PT 02/01/2018, 12:18 PM  [facility] [address] [place], [address] Phone: [phone]   Fax:  [phone]  Name: Cassadie Pankonin MRN: [id number] Date of Birth: 1951/01/11

## 2018-02-03 ENCOUNTER — Ambulatory Visit: Payer: Medicare Other | Admitting: Physical Therapy

## 2018-02-09 ENCOUNTER — Ambulatory Visit: Payer: Medicare Other | Admitting: Physical Therapy

## 2018-02-09 ENCOUNTER — Encounter: Payer: Self-pay | Admitting: Physical Therapy

## 2018-02-09 DIAGNOSIS — M62830 Muscle spasm of back: Secondary | ICD-10-CM

## 2018-02-09 DIAGNOSIS — M6281 Muscle weakness (generalized): Secondary | ICD-10-CM

## 2018-02-09 DIAGNOSIS — M5442 Lumbago with sciatica, left side: Secondary | ICD-10-CM

## 2018-02-09 NOTE — Therapy (Signed)
[facility] [address] [place], [address] Phone: [phone]   Fax:  [phone]  Physical Therapy Treatment  Patient Details  Name: Allison [name] MRN: [id number] Date of Birth: 03/14/1951 Referring Provider: Mar Daring PA-C   Encounter Date: 02/09/2018  PT End of Session - 02/09/18 1518    Visit Number  15    Number of Visits  17    Date for PT Re-Evaluation  02/21/18    PT Start Time  9983    PT Stop Time  1620    PT Time Calculation (min)  65 min    Activity Tolerance  Patient tolerated treatment well    Behavior During Therapy  [facility] for tasks assessed/performed       Past Medical History:  Diagnosis Date  . Medical history non-contributory   . Rosacea     Past Surgical History:  Procedure Laterality Date  . BREAST BIOPSY Left 2002   benign  . CATARACT EXTRACTION W/PHACO Right 07/06/2017   Procedure: CATARACT EXTRACTION PHACO AND INTRAOCULAR LENS PLACEMENT (IOC);  Surgeon: Birder Robson, MD;  Location: [facility] ORS;  Service: Ophthalmology;  Laterality: Right;  [place] 00:34.2 AP% 12.7 CDE 4.33 Fluid Pack lot # [id number] H  . NO PAST SURGERIES      There were no vitals filed for this visit.  Subjective Assessment - 02/09/18 1516    Subjective  Patient reports her legs feel weak. She is now able to drive, sit longer and stand for longer periods of time with increased soreness as time goes on.     Pertinent History  08/2017 pain began for no apparent reason; went to chiropractor in October through present with some results; She went on vacation recently and did not have any pain and when she returned home the pain began again    Limitations  Sitting;Lifting;House hold activities;Other (comment) bending    How long can you sit comfortably?  10 min and depends on chair and using ice helps    How long can you stand comfortably?  as long as needs to    How long can you walk comfortably?  30  min at least    Diagnostic tests  X rays lumbar spine    Patient Stated Goals  decrease pain in back and return to prior level of function; exercising; going up/down steps reciprocally; sleep without    Currently in Pain?  Yes    Pain Score  2     Pain Location  Back    Pain Orientation  Lower    Pain Descriptors / Indicators  Sore;Aching    Pain Type  Acute pain    Pain Onset  More than a month ago    Pain Frequency  Intermittent        Objective:  Palpation: mild spasms right and left lower thoracic/lumbar paraspinal muscles Posture: WFL   Treatment: Therapeutic exercise: patient performed following demonstration and with verbal cues of therapist: HEP: re assessed with handout:  clamshells with resistive  band prone hip extension walking with resistive band hip abduction and extension with resistive band  OMEGA:  seated rows 15# 2 x 15 standing straight arm pull downs 15# x 15 reps standing scapular retraction bilateral 15# x 15 standing modified palloff press 15# x 15 reps (facing column) perpendicular to column 5#   Manual therapy: 10 min goal: improved soft tissue elasticity, spasms, pain STM performed superficial techniques lumbar spine left and  right  side paraspiinal muscles with patient prone lying over 1 pillow, one pillow supporting LE's     Modalities: Electrical stimulation: 20 min.: High volt estim.clincial program for muscle spasms  (4) electrodes applied to bilateral lumbar spine intensity to tolerance with patient in prone position with pillow under pelvis and lower legs  goal: pain, spasms Ice pack applied to lower back while prone lying for inflammation/pain; no adverse effects noted   Patient response to treatment: Improved technique with exercises with minimal verbal cuing and demonstration. decreased spasms and improved soft tissue elasticity by 50% following STM.    PT Education - 02/09/18 1634    Education provided  Yes    Education Details   exercise instruction; HEP    Person(s) Educated  Patient    Methods  Explanation;Demonstration;Verbal cues;Handout    Comprehension  Verbal cues required;Returned demonstration;Verbalized understanding          PT Long Term Goals - 01/24/18 1003      PT LONG TERM GOAL #1   Title   Patient will demonstrate improved function with daily tasks and decreased back pain to improve stair climbing and transfers in/out of car as indicated by MODI score of 15%    Baseline  MODI 26%; 12/23/17 MODI 16%; 01/24/18 16%     Status  Partially Met      PT LONG TERM GOAL #2   Title   Patient will demonstrate improved posture awareness and pain control strategies to allow patient to sit for >30  min. with minimal difficulty/pain    Baseline  10 min. sitting ; continues with difficulty with prolonged sitting >2 hours    Status  Achieved      PT LONG TERM GOAL #3   Title  Patient will be independent with home program for posture awareness, pain control, progressive exercises to allow patient to transition to self management once discharged from physical therapy    Baseline  limited knowledge of pain control strategies, exercises or progression without guidance, instruction and cuing    Status  On-going    Target Date  02/21/18      PT LONG TERM GOAL #4   Title   Patient will demonstrate improved function with daily tasks and decreased back pain and improvement with sleeping with mild/no symtpoms as indicated by MODI score of 10% or less    Baseline  MODI 26%; MODI 12/23/17 16%    Status  On-going    Target Date  02/21/18            Plan - 02/09/18 1720    Clinical Impression Statement  Patient demonstrates good progress towards goals and is advancing exercises for core strength and UE strength. She is improving function with sitting, standing activtiies and should continue to progress with additional physical therapy intervention.     Rehab Potential  Good    Clinical Impairments Affecting Rehab  Potential  (+)acute condition, motivated, active ifestyle     PT Frequency  1x / week    PT Duration  Other (comment) 5 weeks    PT Treatment/Interventions  Manual techniques;Patient/family education;Neuromuscular re-education;Therapeutic exercise;Moist Heat;Iontophoresis 36m/ml Dexamethasone;Cryotherapy;Electrical Stimulation    PT Next Visit Plan  modalities for pain, spasms, manual therapy, exercise progression     PT Home Exercise Plan  posture awareness, log rolling for transfers, hip adduction with ball and glute sets; prone progression through press ups; resistive band walking, planks off wall, counter, scapular rows for stabilization  Patient will benefit from skilled therapeutic intervention in order to improve the following deficits and impairments:  Decreased activity tolerance, Decreased endurance, Decreased strength, Impaired perceived functional ability, Increased muscle spasms, Pain, Improper body mechanics  Visit Diagnosis: Acute left-sided low back pain with left-sided sciatica  Muscle weakness (generalized)  Muscle spasm of back     Problem List Patient Active Problem List   Diagnosis Date Noted  . Blood in the urine 08/13/2015  . H/O hyperthyroidism 08/13/2015  . H/O amblyopia 08/13/2015    Jomarie Longs PT 02/10/2018, 10:07 PM  [facility] [address] [place], [address] Phone: [phone]   Fax:  [phone]  Name: Allison [name] MRN: [id number] Date of Birth: [birth year]

## 2018-02-17 ENCOUNTER — Encounter: Payer: Self-pay | Admitting: Physical Therapy

## 2018-02-17 ENCOUNTER — Ambulatory Visit: Payer: Medicare Other | Attending: Physician Assistant | Admitting: Physical Therapy

## 2018-02-17 DIAGNOSIS — M62830 Muscle spasm of back: Secondary | ICD-10-CM

## 2018-02-17 DIAGNOSIS — M6281 Muscle weakness (generalized): Secondary | ICD-10-CM | POA: Diagnosis present

## 2018-02-17 DIAGNOSIS — M5442 Lumbago with sciatica, left side: Secondary | ICD-10-CM | POA: Diagnosis not present

## 2018-02-17 NOTE — Therapy (Signed)
[facility] [address], [place], [address] Phone: [phone]   Fax:  [phone]  Physical Therapy Treatment/Discharge Summary  Patient Details  Name: Allison [name] MRN: [id number] Date of Birth: March 10, 1951 Referring Provider: Mar Daring PA-C   Encounter Date: 02/17/2018   Patient began physical therapy on 11/25/2017 and has attended 16 sessions through 02/17/2018. She has achieved/partially met goals and is independent with home program for continued self management of pain/symptoms and exercises as instructed. Plan discharge from physical therapy at this time.    PT End of Session - 02/17/18 1353    Visit Number  16    Number of Visits  17    Date for PT Re-Evaluation  02/21/18    PT Start Time  1350    PT Stop Time  1430    PT Time Calculation (min)  40 min    Activity Tolerance  Patient tolerated treatment well    Behavior During Therapy  WFL for tasks assessed/performed       Past Medical History:  Diagnosis Date  . Medical history non-contributory   . Rosacea     Past Surgical History:  Procedure Laterality Date  . BREAST BIOPSY Left 2002   benign  . CATARACT EXTRACTION W/PHACO Right 07/06/2017   Procedure: CATARACT EXTRACTION PHACO AND INTRAOCULAR LENS PLACEMENT (IOC);  Surgeon: Birder Robson, MD;  Location: [facility] ORS;  Service: Ophthalmology;  Laterality: Right;  [place] 00:34.2 AP% 12.7 CDE 4.33 Fluid Pack lot # [id number]  . NO PAST SURGERIES      There were no vitals filed for this visit.  Subjective Assessment - 02/17/18 1351    Subjective  Patient reports she continues with occasional pain in lower back with prolonged standing, driving and overall feels she is more aware of positioning, posture and appropriate exercises to continue with self managment at home.     Pertinent History  08/2017 pain began for no apparent reason; went to chiropractor in October through present  with some results; She went on vacation recently and did not have any pain and when she returned home the pain began again    Limitations  Sitting;Lifting;House hold activities;Other (comment) bending    How long can you sit comfortably?  10 min and depends on chair and using ice helps    How long can you stand comfortably?  as long as needs to    How long can you walk comfortably?  30 min at least    Diagnostic tests  X rays lumbar spine    Patient Stated Goals  decrease pain in back and return to prior level of function; exercising; going up/down steps reciprocally; sleep without    Currently in Pain?  Yes    Pain Score  2     Pain Location  Back    Pain Orientation  Lower    Pain Descriptors / Indicators  Aching    Pain Type  Acute pain    Pain Onset  More than a month ago    Pain Frequency  Intermittent           Objective:  Palpation: mild spasms right and left lower thoracic/lumbar paraspinal muscles Posture: WFL AROM lumbar spine all planes WNL without reproduction of symptoms Outcome measure: MODI 20% (has ranged from 16 -20% over the course of therapy, depending on aggravating activity)    Treatment: Therapeutic exercise: patient performed following demonstration and with verbal cues of therapist:  HEP: re assessed verbally;  clamshells with resistive  band prone hip extension walking with resistive band hip abduction and extension with resistive band   OMEGA:  seated rows 15#  x 15 standing straight arm pull downs 15# x 15 reps standing scapular retraction bilateral 15# x 15 standing modified palloff press 15# x 15 reps (facing column) perpendicular to column 5# Wall pushups x 10 Side planks on wall with hip abduction for stabilization, core control, strength    Manual therapy: 10 min goal: improved soft tissue elasticity, spasms, pain STM performed superficial techniques lumbar spine left and right  side paraspiinal muscles with patient prone lying over 1  pillow, one pillow supporting LE's     Modalities: Electrical stimulation: 15 min.: High volt estim.clincial program for muscle spasms  (4) electrodes applied to bilateral lumbar spine intensity to tolerance with patient in prone position with pillow under pelvis and lower legs  goal: pain, spasms Ice pack applied to lower back while prone lying for inflammation/pain; no adverse effects noted   Patient response to treatment: improved soft tissue elasticity to mild spasms and decreased spasms to mild at end of session Improved technique with exercises with minimal verbal cuing and demonstration.     PT Education - 02/17/18 1649    Education provided  Yes    Education Details  exercise instruction for continued home exercises    Person(s) Educated  Patient    Methods  Explanation;Demonstration;Verbal cues    Comprehension  Verbalized understanding;Returned demonstration;Verbal cues required          PT Long Term Goals - 02/17/18 1645      PT LONG TERM GOAL #1   Title   Patient will demonstrate improved function with daily tasks and decreased back pain to improve stair climbing and transfers in/out of car as indicated by MODI score of 15%    Baseline  MODI 26%; 12/23/17 MODI 16%; 01/24/18 16% ; 02/17/2018 20%    Status  Partially Met      PT LONG TERM GOAL #2   Title   Patient will demonstrate improved posture awareness and pain control strategies to allow patient to sit for >30  min. with minimal difficulty/pain    Baseline  10 min. sitting ; continues with difficulty with prolonged sitting >2 hours    Status  Achieved      PT LONG TERM GOAL #3   Title  Patient will be independent with home program for posture awareness, pain control, progressive exercises to allow patient to transition to self management once discharged from physical therapy    Baseline  limited knowledge of pain control strategies, exercises or progression without guidance, instruction and cuing    Status  Achieved       PT LONG TERM GOAL #4   Title   Patient will demonstrate improved function with daily tasks and decreased back pain and improvement with sleeping with mild/no symtpoms as indicated by MODI score of 10% or less    Baseline  MODI 26%; MODI 12/23/17 16%    Status  Not Met            Plan - 02/17/18 1643    Clinical Impression Statement  Patient has improved with pain, self management of home exercises and pain control. She should continue to improve with self management with indpendent home program.     Rehab Potential  Good    Clinical Impairments Affecting Rehab Potential  (+)acute condition, motivated, active ifestyle  PT Frequency  1x / week    PT Duration  Other (comment) 5 weeks    PT Treatment/Interventions  Manual techniques;Patient/family education;Neuromuscular re-education;Therapeutic exercise;Moist Heat;Iontophoresis '4mg'$ /ml Dexamethasone;Cryotherapy;Electrical Stimulation    PT Next Visit Plan  discharge to home program    PT Home Exercise Plan  posture awareness, log rolling for transfers, hip adduction with ball and glute sets; prone progression through press ups; resistive band walking, planks off wall, counter, scapular rows for stabilization       Patient will benefit from skilled therapeutic intervention in order to improve the following deficits and impairments:  Decreased activity tolerance, Decreased endurance, Decreased strength, Impaired perceived functional ability, Increased muscle spasms, Pain, Improper body mechanics  Visit Diagnosis: Acute left-sided low back pain with left-sided sciatica  Muscle weakness (generalized)  Muscle spasm of back     Problem List Patient Active Problem List   Diagnosis Date Noted  . Blood in the urine 08/13/2015  . H/O hyperthyroidism 08/13/2015  . H/O amblyopia 08/13/2015    Jomarie Longs PT 02/18/2018, 11:18 AM  [facility] [address], [place], [address] Phone: [phone]   Fax:  [phone]  Name: Emaya Preston MRN: [id number] Date of Birth: 03/26/1951

## 2018-03-14 ENCOUNTER — Telehealth: Payer: Self-pay | Admitting: Physician Assistant

## 2018-03-14 NOTE — Telephone Encounter (Signed)
Left message t

## 2018-04-22 ENCOUNTER — Encounter: Payer: Self-pay | Admitting: Physician Assistant

## 2018-04-22 ENCOUNTER — Ambulatory Visit (INDEPENDENT_AMBULATORY_CARE_PROVIDER_SITE_OTHER): Payer: Medicare Other | Admitting: Physician Assistant

## 2018-04-22 VITALS — BP 120/78 | HR 93 | Temp 97.8°F | Resp 16 | Wt 186.6 lb

## 2018-04-22 DIAGNOSIS — M79669 Pain in unspecified lower leg: Secondary | ICD-10-CM

## 2018-04-22 DIAGNOSIS — I83813 Varicose veins of bilateral lower extremities with pain: Secondary | ICD-10-CM

## 2018-04-22 DIAGNOSIS — M7989 Other specified soft tissue disorders: Secondary | ICD-10-CM

## 2018-04-22 NOTE — Progress Notes (Signed)
Patient: Allison [name] Female    DOB: October 03, 1951   67 y.o.   MRN: [id number] Visit Date: 04/22/2018  Today's Provider: Mar Daring, PA-C   Chief Complaint  Patient presents with  . Leg Pain   Subjective:    HPI Patient here with c/o of toes and legs hurting and pain radiating to her lower back. She reports that she is not able to sleep. Associated symptoms:off balance, toes turning red, swelling, stiffness on her toes. She reports that when ever her toes get red and swollen her legs and back hurts.    Allergies  Allergen Reactions  . Strawberry Extract     difficulty breathing  . Tetracycline Rash     Current Outpatient Medications:  .  Calcium Carb-Cholecalciferol (CALCIUM + D3) 600-200 MG-UNIT TABS, Take 2 tablets by mouth daily. , Disp: , Rfl:  .  ferrous sulfate 325 (65 FE) MG tablet, Take 325 mg by mouth daily with breakfast., Disp: , Rfl:  .  FINACEA 15 % FOAM, APPLY A SMALL AMOUNT ON THE SKIN DAILY, Disp: , Rfl: 11 .  RHOFADE 1 % CREA, APPLY TO FACE QAM, Disp: , Rfl: 11 .  meloxicam (MOBIC) 15 MG tablet, Take 1 tablet (15 mg total) daily by mouth. (Patient not taking: Reported on 04/22/2018), Disp: 30 tablet, Rfl: 3  Review of Systems  Constitutional: Negative for fatigue and fever.  Respiratory: Negative.   Cardiovascular: Positive for leg swelling. Negative for chest pain and palpitations.  Musculoskeletal: Positive for back pain and gait problem.  Neurological: Negative for weakness.    Social History   Tobacco Use  . Smoking status: Never Smoker  . Smokeless tobacco: Never Used  Substance Use Topics  . Alcohol use: Yes    Comment: 2 glasses wine daily   Objective:   BP 120/78 (BP Location: Left Arm, Patient Position: Sitting, Cuff Size: Normal)   Pulse 93   Temp 97.8 F (36.6 C) (Oral)   Resp 16   Wt 186 lb 9.6 oz (84.6 kg)   BMI 26.77 kg/m    Physical Exam  Constitutional: She appears well-developed and well-nourished. No  distress.  Neck: Normal range of motion. Neck supple.  Cardiovascular: Normal rate, regular rhythm, normal heart sounds, intact distal pulses and normal pulses. Exam reveals no gallop and no friction rub.  No murmur heard. Pulmonary/Chest: Effort normal and breath sounds normal. No respiratory distress. She has no wheezes. She has no rales.  Musculoskeletal: She exhibits edema (trace edema bilaterally).       Right knee: Normal.       Left knee: Normal.       Right ankle: Normal.       Left ankle: Normal.       Lumbar back: She exhibits tenderness. She exhibits normal range of motion, no bony tenderness and no spasm.  Skin: She is not diaphoretic.     Vitals reviewed.      Assessment & Plan:     1. Pain and swelling of lower leg, unspecified laterality Highly suspicious for venous insuff. Will check labs to R/O other causes. Advised to use compression therapy and elevation of legs. Tylenol or IBU prn for pain. May consider adding a fluid pill if labs ok. If symptoms still persist may refer to vein clinic.  - CBC w/Diff/Platelet - Basic Metabolic Panel (BMET) - B Nat Peptide  2. Varicose veins of bilateral lower extremities with pain See above medical treatment  plan. - CBC w/Diff/Platelet - Basic Metabolic Panel (BMET) - [place], PA-C  [facility]

## 2018-04-22 NOTE — Patient Instructions (Addendum)
Chronic Venous Insufficiency Chronic venous insufficiency, also called venous stasis, is a condition that prevents blood from being pumped effectively through the veins in your legs. Blood may no longer be pumped effectively from the legs back to the heart. This condition can range from mild to severe. With proper treatment, you should be able to continue with an active life. What are the causes? Chronic venous insufficiency occurs when the vein walls become stretched, weakened, or damaged, or when valves within the vein are damaged. Some common causes of this include:  High blood pressure inside the veins (venous hypertension).  Increased blood pressure in the leg veins from long periods of sitting or standing.  A blood clot that blocks blood flow in a vein (deep vein thrombosis, DVT).  Inflammation of a vein (phlebitis) that causes a blood clot to form.  Tumors in the pelvis that cause blood to back up.  What increases the risk? The following factors may make you more likely to develop this condition:  Having a family history of this condition.  Obesity.  Pregnancy.  Living without enough physical activity or exercise (sedentary lifestyle).  Smoking.  Having a job that requires long periods of standing or sitting in one place.  Being a certain age. Women in their 40s and 50s and men in their 70s are more likely to develop this condition.  What are the signs or symptoms? Symptoms of this condition include:  Veins that are enlarged, bulging, or twisted (varicose veins).  Skin breakdown or ulcers.  Reddened or discolored skin on the front of the leg.  Brown, smooth, tight, and painful skin just above the ankle, usually on the inside of the leg (lipodermatosclerosis).  Swelling.  How is this diagnosed? This condition may be diagnosed based on:  Your medical history.  A physical exam.  Tests, such as: ? A procedure that creates an image of a blood vessel and nearby  organs and provides information about blood flow through the blood vessel (duplex ultrasound). ? A procedure that tests blood flow (plethysmography). ? A procedure to look at the veins using X-ray and dye (venogram).  How is this treated? The goals of treatment are to help you return to an active life and to minimize pain or disability. Treatment depends on the severity of your condition, and it may include:  Wearing compression stockings. These can help relieve symptoms and help prevent your condition from getting worse. However, they do not cure the condition.  Sclerotherapy. This is a procedure involving an injection of a material that "dissolves" damaged veins.  Surgery. This may involve: ? Removing a diseased vein (vein stripping). ? Cutting off blood flow through the vein (laser ablation surgery). ? Repairing a valve.  Follow these instructions at home:  Wear compression stockings as told by your health care provider. These stockings help to prevent blood clots and reduce swelling in your legs.  Take over-the-counter and prescription medicines only as told by your health care provider.  Stay active by exercising, walking, or doing different activities. Ask your health care provider what activities are safe for you and how much exercise you need.  Drink enough fluid to keep your urine clear or pale yellow.  Do not use any products that contain nicotine or tobacco, such as cigarettes and e-cigarettes. If you need help quitting, ask your health care provider.  Keep all follow-up visits as told by your health care provider. This is important. Contact a health care provider if:  You   have redness, swelling, or more pain in the affected area.  You see a red streak or line that extends up or down from the affected area.  You have skin breakdown or a loss of skin in the affected area, even if the breakdown is small.  You get an injury in the affected area. Get help right away  if:  You get an injury and an open wound in the affected area.  You have severe pain that does not get better with medicine.  You have sudden numbness or weakness in the foot or ankle below the affected area, or you have trouble moving your foot or ankle.  You have a fever and you have worse or persistent symptoms.  You have chest pain.  You have shortness of breath. Summary  Chronic venous insufficiency, also called venous stasis, is a condition that prevents blood from being pumped effectively through the veins in your legs.  Chronic venous insufficiency occurs when the vein walls become stretched, weakened, or damaged, or when valves within the vein are damaged.  Treatment for this condition depends on how severe your condition is, and it may involve wearing compression stockings or having a procedure.  Make sure you stay active by exercising, walking, or doing different activities. Ask your health care provider what activities are safe for you and how much exercise you need. This information is not intended to replace advice given to you by your health care provider. Make sure you discuss any questions you have with your health care provider. Document Released: 03/08/2007 Document Revised: 09/21/2016 Document Reviewed: 09/21/2016   How to Use Compression Stockings Compression stockings are elastic socks that squeeze the legs. They help to increase blood flow to the legs, decrease swelling in the legs, and reduce the chance of developing blood clots in the lower legs. Compression stockings are often used by people who:  Are recovering from surgery.  Have poor circulation in their legs.  Are prone to getting blood clots in their legs.  Have varicose veins.  Sit or stay in bed for long periods of time.  How to use compression stockings Before you put on your compression stockings:  Make sure that they are the correct size. If you do not know your size, ask your health care  provider.  Make sure that they are clean, dry, and in good condition.  Check them for rips and tears. Do not put them on if they are ripped or torn.  Put your stockings on first thing in the morning, before you get out of bed. Keep them on for as long as your health care provider advises. When you are wearing your stockings:  Keep them as smooth as possible. Do not allow them to bunch up. It is especially important to prevent the stockings from bunching up around your toes or behind your knees.  Do not roll the stockings downward and leave them rolled down. This can decrease blood flow to your leg.  Change them right away if they become wet or dirty.  When you take off your stockings, inspect your legs and feet. Anything that does not seem normal may require medical attention. Look for:  Open sores.  Red spots.  Swelling.  Information and tips  Do not stop wearing your compression stockings without talking to your health care provider first.  Wash your stockings every day with mild detergent in cold or warm water. Do not use bleach. Air-dry your stockings or dry them in a  clothes dryer on low heat.  Replace your stockings every 3-6 months.  If skin moisturizing is part of your treatment plan, apply lotion or cream at night so that your skin will be dry when you put on the stockings in the morning. It is harder to put the stockings on when you have lotion on your legs or feet. Contact a health care provider if: Remove your stockings and seek medical care if:  You have a feeling of pins and needles in your feet or legs.  You have any new changes in your skin.  You have skin lesions that are getting worse.  You have swelling or pain that is getting worse.  Get help right away if:  You have numbness or tingling in your lower legs that does not get better right after you take the stockings off.  Your toes or feet become cold and blue.  You develop open sores or red spots on  your legs that do not go away.  You see or feel a warm spot on your leg.  You have new swelling or soreness in your leg.  You are short of breath or you have chest pain for no reason.  You have a rapid or irregular heartbeat.  You feel light-headed or dizzy. This information is not intended to replace advice given to you by your health care provider. Make sure you discuss any questions you have with your health care provider. Document Released: 08/30/2009 Document Revised: 04/01/2016 Document Reviewed: 10/10/2014 Elsevier Interactive Patient Education  2018 [employer].  Chartered certified accountant Patient Education  [employer].

## 2018-04-23 LAB — CBC WITH DIFFERENTIAL/PLATELET
BASOS: 1 %
Basophils Absolute: 0 10*3/uL (ref 0.0–0.2)
EOS (ABSOLUTE): 0.1 10*3/uL (ref 0.0–0.4)
EOS: 2 %
HEMOGLOBIN: 12.8 g/dL (ref 11.1–15.9)
Hematocrit: 38.3 % (ref 34.0–46.6)
IMMATURE GRANULOCYTES: 0 %
Immature Grans (Abs): 0 10*3/uL (ref 0.0–0.1)
Lymphocytes Absolute: 1.1 10*3/uL (ref 0.7–3.1)
Lymphs: 23 %
MCH: 30.9 pg (ref 26.6–33.0)
MCHC: 33.4 g/dL (ref 31.5–35.7)
MCV: 93 fL (ref 79–97)
MONOCYTES: 6 %
Monocytes Absolute: 0.3 10*3/uL (ref 0.1–0.9)
NEUTROS PCT: 68 %
Neutrophils Absolute: 3.1 10*3/uL (ref 1.4–7.0)
Platelets: 311 10*3/uL (ref 150–450)
RBC: 4.14 x10E6/uL (ref 3.77–5.28)
RDW: 12.9 % (ref 12.3–15.4)
WBC: 4.6 10*3/uL (ref 3.4–10.8)

## 2018-04-23 LAB — BASIC METABOLIC PANEL
BUN/Creatinine Ratio: 11 — ABNORMAL LOW (ref 12–28)
BUN: 10 mg/dL (ref 8–27)
CALCIUM: 9.8 mg/dL (ref 8.7–10.3)
CO2: 24 mmol/L (ref 20–29)
CREATININE: 0.88 mg/dL (ref 0.57–1.00)
Chloride: 100 mmol/L (ref 96–106)
GFR calc Af Amer: 79 mL/min/{1.73_m2} (ref >59)
GFR, EST NON AFRICAN AMERICAN: 69 mL/min/{1.73_m2} (ref >59)
Glucose: 112 mg/dL — ABNORMAL HIGH (ref 65–99)
Potassium: 4 mmol/L (ref 3.5–5.2)
Sodium: 139 mmol/L (ref 134–144)

## 2018-04-23 LAB — BRAIN NATRIURETIC PEPTIDE: BNP: 43.1 pg/mL (ref 0.0–100.0)

## 2018-04-25 ENCOUNTER — Telehealth: Payer: Self-pay

## 2018-04-25 DIAGNOSIS — M7989 Other specified soft tissue disorders: Secondary | ICD-10-CM

## 2018-04-25 MED ORDER — FUROSEMIDE 20 MG PO TABS: 20.0000 mg | ORAL_TABLET | Freq: Every day | ORAL | 3 refills | Status: DC | PRN

## 2018-04-25 MED ORDER — POTASSIUM CHLORIDE ER 10 MEQ PO TBCR: 10.0000 meq | EXTENDED_RELEASE_TABLET | Freq: Every day | ORAL | 3 refills | Status: DC | PRN

## 2018-04-25 NOTE — Telephone Encounter (Signed)
-----   Message from Mar Daring, PA-C sent at 04/24/2018  3:50 PM EDT ----- Labs are normal. No cause for edema noted. If patient desires will send in fluid pill.

## 2018-04-25 NOTE — Telephone Encounter (Signed)
Sent in

## 2018-04-25 NOTE — Telephone Encounter (Signed)
Patient advised as below. Patient reports she would like to try fluid pill and to send to [facility] pharmacy.

## 2018-05-16 ENCOUNTER — Encounter: Payer: Self-pay | Admitting: Physician Assistant

## 2018-05-16 ENCOUNTER — Ambulatory Visit (INDEPENDENT_AMBULATORY_CARE_PROVIDER_SITE_OTHER): Payer: Medicare Other | Admitting: Physician Assistant

## 2018-05-16 VITALS — BP 106/80 | HR 76 | Temp 98.6°F | Resp 16 | Wt 182.0 lb

## 2018-05-16 DIAGNOSIS — W57XXXA Bitten or stung by nonvenomous insect and other nonvenomous arthropods, initial encounter: Secondary | ICD-10-CM

## 2018-05-16 DIAGNOSIS — J069 Acute upper respiratory infection, unspecified: Secondary | ICD-10-CM

## 2018-05-16 MED ORDER — DOXYCYCLINE HYCLATE 100 MG PO TABS: 100.0000 mg | ORAL_TABLET | Freq: Two times a day (BID) | ORAL | 0 refills | Status: DC

## 2018-05-16 NOTE — Patient Instructions (Signed)
Upper Respiratory Infection, Adult Most upper respiratory infections (URIs) are caused by a virus. A URI affects the nose, throat, and upper air passages. The most common type of URI is often called "the common cold." Follow these instructions at home:  Take medicines only as told by your doctor.  Gargle warm saltwater or take cough drops to comfort your throat as told by your doctor.  Use a warm mist humidifier or inhale steam from a shower to increase air moisture. This may make it easier to breathe.  Drink enough fluid to keep your pee (urine) clear or pale yellow.  Eat soups and other clear broths.  Have a healthy diet.  Rest as needed.  Go back to work when your fever is gone or your doctor says it is okay. ? You may need to stay home longer to avoid giving your URI to others. ? You can also wear a face mask and wash your hands often to prevent spread of the virus.  Use your inhaler more if you have asthma.  Do not use any tobacco products, including cigarettes, chewing tobacco, or electronic cigarettes. If you need help quitting, ask your doctor. Contact a doctor if:  You are getting worse, not better.  Your symptoms are not helped by medicine.  You have chills.  You are getting more short of breath.  You have brown or red mucus.  You have yellow or brown discharge from your nose.  You have pain in your face, especially when you bend forward.  You have a fever.  You have puffy (swollen) neck glands.  You have pain while swallowing.  You have white areas in the back of your throat. Get help right away if:  You have very bad or constant: ? Headache. ? Ear pain. ? Pain in your forehead, behind your eyes, and over your cheekbones (sinus pain). ? Chest pain.  You have long-lasting (chronic) lung disease and any of the following: ? Wheezing. ? Long-lasting cough. ? Coughing up blood. ? A change in your usual mucus.  You have a stiff neck.  You have  changes in your: ? Vision. ? Hearing. ? Thinking. ? Mood. This information is not intended to replace advice given to you by your health care provider. Make sure you discuss any questions you have with your health care provider. Document Released: 04/20/2008 Document Revised: 07/05/2016 Document Reviewed: 02/07/2014 Elsevier Interactive Patient Education  2018 [employer]  

## 2018-05-16 NOTE — Progress Notes (Signed)
Patient: Allison [name] Female    DOB: Oct 01, 1951   67 y.o.   MRN: [id number] Visit Date: 05/16/2018  Today's Provider: Mar Daring, PA-C   Chief Complaint  Patient presents with  . Cough   Subjective:    HPI Patient here today C/O nasal congestion, head ache and cough since Thursday. Patient reports she had low grade fever 99.6 this morning. Patient reports she has been taking cough drops and DM liquid cough syrup. Patient reports that on Tuesday she did pull of a tick from her left leg. Patient not sure if that has anything to do with her current symptoms.     Allergies  Allergen Reactions  . Strawberry Extract     difficulty breathing  . Tetracycline Rash     Current Outpatient Medications:  .  Calcium Carb-Cholecalciferol (CALCIUM + D3) 600-200 MG-UNIT TABS, Take 2 tablets by mouth daily. , Disp: , Rfl:  .  ferrous sulfate 325 (65 FE) MG tablet, Take 325 mg by mouth daily with breakfast., Disp: , Rfl:  .  FINACEA 15 % FOAM, APPLY A SMALL AMOUNT ON THE SKIN DAILY, Disp: , Rfl: 11 .  furosemide (LASIX) 20 MG tablet, Take 1 tablet (20 mg total) by mouth daily as needed., Disp: 30 tablet, Rfl: 3 .  meloxicam (MOBIC) 15 MG tablet, Take 1 tablet (15 mg total) daily by mouth., Disp: 30 tablet, Rfl: 3 .  potassium chloride (K-DUR) 10 MEQ tablet, Take 1 tablet (10 mEq total) by mouth daily as needed. Anytime she takes furosemide take potassium, Disp: 30 tablet, Rfl: 3 .  RHOFADE 1 % CREA, APPLY TO FACE QAM, Disp: , Rfl: 11  Review of Systems  Constitutional: Positive for chills and fever.  HENT: Positive for congestion, sinus pressure and sinus pain.   Respiratory: Positive for cough.   Cardiovascular: Negative.   Neurological: Positive for headaches.    Social History   Tobacco Use  . Smoking status: Never Smoker  . Smokeless tobacco: Never Used  Substance Use Topics  . Alcohol use: Yes    Comment: 2 glasses wine daily   Objective:   BP 106/80 (BP  Location: Left Arm, Patient Position: Sitting, Cuff Size: Normal)   Pulse 76   Temp 98.6 F (37 C) (Oral)   Resp 16   Wt 182 lb (82.6 kg)   SpO2 98%   BMI 26.11 kg/m  Vitals:   05/16/18 1553  BP: 106/80  Pulse: 76  Resp: 16  Temp: 98.6 F (37 C)  TempSrc: Oral  SpO2: 98%  Weight: 182 lb (82.6 kg)     Physical Exam  Constitutional: She appears well-developed and well-nourished. No distress.  HENT:  Head: Normocephalic and atraumatic.  Right Ear: Hearing, tympanic membrane, external ear and ear canal normal.  Left Ear: Hearing, tympanic membrane, external ear and ear canal normal.  Nose: Nose normal.  Mouth/Throat: Uvula is midline, oropharynx is clear and moist and mucous membranes are normal. No oropharyngeal exudate.  Eyes: Pupils are equal, round, and reactive to light. Conjunctivae are normal. Right eye exhibits no discharge. Left eye exhibits no discharge. No scleral icterus.  Neck: Normal range of motion. Neck supple. No tracheal deviation present. No thyromegaly present.  Cardiovascular: Normal rate, regular rhythm and normal heart sounds. Exam reveals no gallop and no friction rub.  No murmur heard. Pulmonary/Chest: Effort normal and breath sounds normal. No stridor. No respiratory distress. She has no wheezes. She has no  rales.  Lymphadenopathy:    She has no cervical adenopathy.  Skin: Skin is warm and dry. She is not diaphoretic.  Vitals reviewed.      Assessment & Plan:     1. Upper respiratory tract infection, unspecified type Worsening symptoms that have not responded to OTC medications. Will give Doxycycline as below. Continue allergy medications. Stay well hydrated and get plenty of rest. Call if no symptom improvement or if symptoms worsen. - doxycycline (VIBRA-TABS) 100 MG tablet; Take 1 tablet (100 mg total) by mouth 2 (two) times daily.  Dispense: 20 tablet; Refill: 0  2. Tick bite, initial encounter Will use doxycycline as below to hopefully  prevent any tick borne illness since she does not know how long tick was on her.  - doxycycline (VIBRA-TABS) 100 MG tablet; Take 1 tablet (100 mg total) by mouth 2 (two) times daily.  Dispense: 20 tablet; Refill: 0       Mar Daring, PA-C  [facility]

## 2018-06-30 ENCOUNTER — Telehealth: Payer: Self-pay | Admitting: Physician Assistant

## 2018-08-17 ENCOUNTER — Ambulatory Visit (INDEPENDENT_AMBULATORY_CARE_PROVIDER_SITE_OTHER): Payer: Medicare Other

## 2018-08-17 ENCOUNTER — Ambulatory Visit (INDEPENDENT_AMBULATORY_CARE_PROVIDER_SITE_OTHER): Payer: Medicare Other | Admitting: Physician Assistant

## 2018-08-17 ENCOUNTER — Encounter: Payer: Self-pay | Admitting: Physician Assistant

## 2018-08-17 VITALS — BP 118/72 | HR 67 | Temp 97.8°F | Ht 70.0 in | Wt 181.8 lb

## 2018-08-17 DIAGNOSIS — Z23 Encounter for immunization: Secondary | ICD-10-CM

## 2018-08-17 DIAGNOSIS — Z1211 Encounter for screening for malignant neoplasm of colon: Secondary | ICD-10-CM

## 2018-08-17 DIAGNOSIS — Z0000 Encounter for general adult medical examination without abnormal findings: Secondary | ICD-10-CM | POA: Diagnosis not present

## 2018-08-17 DIAGNOSIS — Z8639 Personal history of other endocrine, nutritional and metabolic disease: Secondary | ICD-10-CM | POA: Diagnosis not present

## 2018-08-17 DIAGNOSIS — Z1239 Encounter for other screening for malignant neoplasm of breast: Secondary | ICD-10-CM

## 2018-08-17 NOTE — Patient Instructions (Signed)
Health Maintenance for Postmenopausal Women Menopause is a normal process in which your reproductive ability comes to an end. This process happens gradually over a span of months to years, usually between the ages of 22 and 9. Menopause is complete when you have missed 12 consecutive menstrual periods. It is important to talk with your health care provider about some of the most common conditions that affect postmenopausal women, such as heart disease, cancer, and bone loss (osteoporosis). Adopting a healthy lifestyle and getting preventive care can help to promote your health and wellness. Those actions can also lower your chances of developing some of these common conditions. What should I know about menopause? During menopause, you may experience a number of symptoms, such as:  Moderate-to-severe hot flashes.  Night sweats.  Decrease in sex drive.  Mood swings.  Headaches.  Tiredness.  Irritability.  Memory problems.  Insomnia.  Choosing to treat or not to treat menopausal changes is an individual decision that you make with your health care provider. What should I know about hormone replacement therapy and supplements? Hormone therapy products are effective for treating symptoms that are associated with menopause, such as hot flashes and night sweats. Hormone replacement carries certain risks, especially as you become older. If you are thinking about using estrogen or estrogen with progestin treatments, discuss the benefits and risks with your health care provider. What should I know about heart disease and stroke? Heart disease, heart attack, and stroke become more likely as you age. This may be due, in part, to the hormonal changes that your body experiences during menopause. These can affect how your body processes dietary fats, triglycerides, and cholesterol. Heart attack and stroke are both medical emergencies. There are many things that you can do to help prevent heart disease  and stroke:  Have your blood pressure checked at least every 1-2 years. High blood pressure causes heart disease and increases the risk of stroke.  If you are 53-22 years old, ask your health care provider if you should take aspirin to prevent a heart attack or a stroke.  Do not use any tobacco products, including cigarettes, chewing tobacco, or electronic cigarettes. If you need help quitting, ask your health care provider.  It is important to eat a healthy diet and maintain a healthy weight. ? Be sure to include plenty of vegetables, fruits, low-fat dairy products, and lean protein. ? Avoid eating foods that are high in solid fats, added sugars, or salt (sodium).  Get regular exercise. This is one of the most important things that you can do for your health. ? Try to exercise for at least 150 minutes each week. The type of exercise that you do should increase your heart rate and make you sweat. This is known as moderate-intensity exercise. ? Try to do strengthening exercises at least twice each week. Do these in addition to the moderate-intensity exercise.  Know your numbers.Ask your health care provider to check your cholesterol and your blood glucose. Continue to have your blood tested as directed by your health care provider.  What should I know about cancer screening? There are several types of cancer. Take the following steps to reduce your risk and to catch any cancer development as early as possible. Breast Cancer  Practice breast self-awareness. ? This means understanding how your breasts normally appear and feel. ? It also means doing regular breast self-exams. Let your health care provider know about any changes, no matter how small.  If you are 40  or older, have a clinician do a breast exam (clinical breast exam or CBE) every year. Depending on your age, family history, and medical history, it may be recommended that you also have a yearly breast X-ray (mammogram).  If you  have a family history of breast cancer, talk with your health care provider about genetic screening.  If you are at high risk for breast cancer, talk with your health care provider about having an MRI and a mammogram every year.  Breast cancer (BRCA) gene test is recommended for women who have family members with BRCA-related cancers. Results of the assessment will determine the need for genetic counseling and BRCA1 and for BRCA2 testing. BRCA-related cancers include these types: ? Breast. This occurs in males or females. ? Ovarian. ? Tubal. This may also be called fallopian tube cancer. ? Cancer of the abdominal or pelvic lining (peritoneal cancer). ? Prostate. ? Pancreatic.  Cervical, Uterine, and Ovarian Cancer Your health care provider may recommend that you be screened regularly for cancer of the pelvic organs. These include your ovaries, uterus, and vagina. This screening involves a pelvic exam, which includes checking for microscopic changes to the surface of your cervix (Pap test).  For women ages 21-65, health care providers may recommend a pelvic exam and a Pap test every three years. For women ages 79-65, they may recommend the Pap test and pelvic exam, combined with testing for human papilloma virus (HPV), every five years. Some types of HPV increase your risk of cervical cancer. Testing for HPV may also be done on women of any age who have unclear Pap test results.  Other health care providers may not recommend any screening for nonpregnant women who are considered low risk for pelvic cancer and have no symptoms. Ask your health care provider if a screening pelvic exam is right for you.  If you have had past treatment for cervical cancer or a condition that could lead to cancer, you need Pap tests and screening for cancer for at least 20 years after your treatment. If Pap tests have been discontinued for you, your risk factors (such as having a new sexual partner) need to be  reassessed to determine if you should start having screenings again. Some women have medical problems that increase the chance of getting cervical cancer. In these cases, your health care provider may recommend that you have screening and Pap tests more often.  If you have a family history of uterine cancer or ovarian cancer, talk with your health care provider about genetic screening.  If you have vaginal bleeding after reaching menopause, tell your health care provider.  There are currently no reliable tests available to screen for ovarian cancer.  Lung Cancer Lung cancer screening is recommended for adults 69-62 years old who are at high risk for lung cancer because of a history of smoking. A yearly low-dose CT scan of the lungs is recommended if you:  Currently smoke.  Have a history of at least 30 pack-years of smoking and you currently smoke or have quit within the past 15 years. A pack-year is smoking an average of one pack of cigarettes per day for one year.  Yearly screening should:  Continue until it has been 15 years since you quit.  Stop if you develop a health problem that would prevent you from having lung cancer treatment.  Colorectal Cancer  This type of cancer can be detected and can often be prevented.  Routine colorectal cancer screening usually begins at  age 42 and continues through age 45.  If you have risk factors for colon cancer, your health care provider may recommend that you be screened at an earlier age.  If you have a family history of colorectal cancer, talk with your health care provider about genetic screening.  Your health care provider may also recommend using home test kits to check for hidden blood in your stool.  A small camera at the end of a tube can be used to examine your colon directly (sigmoidoscopy or colonoscopy). This is done to check for the earliest forms of colorectal cancer.  Direct examination of the colon should be repeated every  5-10 years until age 71. However, if early forms of precancerous polyps or small growths are found or if you have a family history or genetic risk for colorectal cancer, you may need to be screened more often.  Skin Cancer  Check your skin from head to toe regularly.  Monitor any moles. Be sure to tell your health care provider: ? About any new moles or changes in moles, especially if there is a change in a mole's shape or color. ? If you have a mole that is larger than the size of a pencil eraser.  If any of your family members has a history of skin cancer, especially at a young age, talk with your health care provider about genetic screening.  Always use sunscreen. Apply sunscreen liberally and repeatedly throughout the day.  Whenever you are outside, protect yourself by wearing long sleeves, pants, a wide-brimmed hat, and sunglasses.  What should I know about osteoporosis? Osteoporosis is a condition in which bone destruction happens more quickly than new bone creation. After menopause, you may be at an increased risk for osteoporosis. To help prevent osteoporosis or the bone fractures that can happen because of osteoporosis, the following is recommended:  If you are 46-71 years old, get at least 1,000 mg of calcium and at least 600 mg of vitamin D per day.  If you are older than age 55 but younger than age 65, get at least 1,200 mg of calcium and at least 600 mg of vitamin D per day.  If you are older than age 54, get at least 1,200 mg of calcium and at least 800 mg of vitamin D per day.  Smoking and excessive alcohol intake increase the risk of osteoporosis. Eat foods that are rich in calcium and vitamin D, and do weight-bearing exercises several times each week as directed by your health care provider. What should I know about how menopause affects my mental health? Depression may occur at any age, but it is more common as you become older. Common symptoms of depression  include:  Low or sad mood.  Changes in sleep patterns.  Changes in appetite or eating patterns.  Feeling an overall lack of motivation or enjoyment of activities that you previously enjoyed.  Frequent crying spells.  Talk with your health care provider if you think that you are experiencing depression. What should I know about immunizations? It is important that you get and maintain your immunizations. These include:  Tetanus, diphtheria, and pertussis (Tdap) booster vaccine.  Influenza every year before the flu season begins.  Pneumonia vaccine.  Shingles vaccine.  Your health care provider may also recommend other immunizations. This information is not intended to replace advice given to you by your health care provider. Make sure you discuss any questions you have with your health care provider. Document Released: 12/25/2005  Document Revised: 05/22/2016 Document Reviewed: 08/06/2015 Elsevier Interactive Patient Education  2018 [employer]  

## 2018-08-17 NOTE — Patient Instructions (Signed)
[name] , Thank you for taking time to come for your Medicare Wellness Visit. I appreciate your ongoing commitment to your health goals. Please review the following plan we discussed and let me know if I can assist you in the future.   Screening recommendations/referrals: Colonoscopy: Referral sent today.  Mammogram: Up to date Bone Density: Up to date Recommended yearly ophthalmology/optometry visit for glaucoma screening and checkup Recommended yearly dental visit for hygiene and checkup  Vaccinations: Influenza vaccine: Up to date Pneumococcal vaccine: Up to date Tdap vaccine: Pt declines today.  Shingles vaccine: Pt declines today.     Advanced directives: Please bring a copy of your POA (Power of Attorney) and/or Living Will to your next appointment.   Conditions/risks identified: Recommend to start walking 3 days a week for at least 30 minutes at a time.   Next appointment: 10 AM today with Allison [name].   Preventive Care 44 Years and Older, Female Preventive care refers to lifestyle choices and visits with your health care provider that can promote health and wellness. What does preventive care include?  A yearly physical exam. This is also called an annual well check.  Dental exams once or twice a year.  Routine eye exams. Ask your health care provider how often you should have your eyes checked.  Personal lifestyle choices, including:  Daily care of your teeth and gums.  Regular physical activity.  Eating a healthy diet.  Avoiding tobacco and drug use.  Limiting alcohol use.  Practicing safe sex.  Taking low-dose aspirin every day.  Taking vitamin and mineral supplements as recommended by your health care provider. What happens during an annual well check? The services and screenings done by your health care provider during your annual well check will depend on your age, overall health, lifestyle risk factors, and family history of  disease. Counseling  Your health care provider may ask you questions about your:  Alcohol use.  Tobacco use.  Drug use.  Emotional well-being.  Home and relationship well-being.  Sexual activity.  Eating habits.  History of falls.  Memory and ability to understand (cognition).  Work and work Statistician.  Reproductive health. Screening  You may have the following tests or measurements:  Height, weight, and BMI.  Blood pressure.  Lipid and cholesterol levels. These may be checked every 5 years, or more frequently if you are over 66 years old.  Skin check.  Lung cancer screening. You may have this screening every year starting at age 27 if you have a 30-pack-year history of smoking and currently smoke or have quit within the past 15 years.  Fecal occult blood test (FOBT) of the stool. You may have this test every year starting at age 26.  Flexible sigmoidoscopy or colonoscopy. You may have a sigmoidoscopy every 5 years or a colonoscopy every 10 years starting at age 25.  Hepatitis C blood test.  Hepatitis B blood test.  Sexually transmitted disease (STD) testing.  Diabetes screening. This is done by checking your blood sugar (glucose) after you have not eaten for a while (fasting). You may have this done every 1-3 years.  Bone density scan. This is done to screen for osteoporosis. You may have this done starting at age 34.  Mammogram. This may be done every 1-2 years. Talk to your health care provider about how often you should have regular mammograms. Talk with your health care provider about your test results, treatment options, and if necessary, the need for more  tests. Vaccines  Your health care provider may recommend certain vaccines, such as:  Influenza vaccine. This is recommended every year.  Tetanus, diphtheria, and acellular pertussis (Tdap, Td) vaccine. You may need a Td booster every 10 years.  Zoster vaccine. You may need this after age  53.  Pneumococcal 13-valent conjugate (PCV13) vaccine. One dose is recommended after age 67.  Pneumococcal polysaccharide (PPSV23) vaccine. One dose is recommended after age 11. Talk to your health care provider about which screenings and vaccines you need and how often you need them. This information is not intended to replace advice given to you by your health care provider. Make sure you discuss any questions you have with your health care provider. Document Released: 11/29/2015 Document Revised: 07/22/2016 Document Reviewed: 09/03/2015 Elsevier Interactive Patient Education  2017 [place] Prevention in the Home Falls can cause injuries. They can happen to people of all ages. There are many things you can do to make your home safe and to help prevent falls. What can I do on the outside of my home?  Regularly fix the edges of walkways and driveways and fix any cracks.  Remove anything that might make you trip as you walk through a door, such as a raised step or threshold.  Trim any bushes or trees on the path to your home.  Use bright outdoor lighting.  Clear any walking paths of anything that might make someone trip, such as rocks or tools.  Regularly check to see if handrails are loose or broken. Make sure that both sides of any steps have handrails.  Any raised decks and porches should have guardrails on the edges.  Have any leaves, snow, or ice cleared regularly.  Use sand or salt on walking paths during winter.  Clean up any spills in your garage right away. This includes oil or grease spills. What can I do in the bathroom?  Use night lights.  Install grab bars by the toilet and in the tub and shower. Do not use towel bars as grab bars.  Use non-skid mats or decals in the tub or shower.  If you need to sit down in the shower, use a plastic, non-slip stool.  Keep the floor dry. Clean up any water that spills on the floor as soon as it happens.  Remove  soap buildup in the tub or shower regularly.  Attach bath mats securely with double-sided non-slip rug tape.  Do not have throw rugs and other things on the floor that can make you trip. What can I do in the bedroom?  Use night lights.  Make sure that you have a light by your bed that is easy to reach.  Do not use any sheets or blankets that are too big for your bed. They should not hang down onto the floor.  Have a firm chair that has side arms. You can use this for support while you get dressed.  Do not have throw rugs and other things on the floor that can make you trip. What can I do in the kitchen?  Clean up any spills right away.  Avoid walking on wet floors.  Keep items that you use a lot in easy-to-reach places.  If you need to reach something above you, use a strong step stool that has a grab bar.  Keep electrical cords out of the way.  Do not use floor polish or wax that makes floors slippery. If you must use wax, use non-skid floor  wax.  Do not have throw rugs and other things on the floor that can make you trip. What can I do with my stairs?  Do not leave any items on the stairs.  Make sure that there are handrails on both sides of the stairs and use them. Fix handrails that are broken or loose. Make sure that handrails are as long as the stairways.  Check any carpeting to make sure that it is firmly attached to the stairs. Fix any carpet that is loose or worn.  Avoid having throw rugs at the top or bottom of the stairs. If you do have throw rugs, attach them to the floor with carpet tape.  Make sure that you have a light switch at the top of the stairs and the bottom of the stairs. If you do not have them, ask someone to add them for you. What else can I do to help prevent falls?  Wear shoes that:  Do not have high heels.  Have rubber bottoms.  Are comfortable and fit you well.  Are closed at the toe. Do not wear sandals.  If you use a  stepladder:  Make sure that it is fully opened. Do not climb a closed stepladder.  Make sure that both sides of the stepladder are locked into place.  Ask someone to hold it for you, if possible.  Clearly mark and make sure that you can see:  Any grab bars or handrails.  First and last steps.  Where the edge of each step is.  Use tools that help you move around (mobility aids) if they are needed. These include:  Canes.  Walkers.  Scooters.  Crutches.  Turn on the lights when you go into a dark area. Replace any light bulbs as soon as they burn out.  Set up your furniture so you have a clear path. Avoid moving your furniture around.  If any of your floors are uneven, fix them.  If there are any pets around you, be aware of where they are.  Review your medicines with your doctor. Some medicines can make you feel dizzy. This can increase your chance of falling. Ask your doctor what other things that you can do to help prevent falls. This information is not intended to replace advice given to you by your health care provider. Make sure you discuss any questions you have with your health care provider. Document Released: 08/29/2009 Document Revised: 04/09/2016 Document Reviewed: 12/07/2014 Elsevier Interactive Patient Education  2017 [employer].

## 2018-08-17 NOTE — Progress Notes (Signed)
Subjective:   Allison [name] is a 67 y.o. female who presents for Medicare Annual (Subsequent) preventive examination.  Review of Systems:  N/A  Cardiac Risk Factors include: advanced age (>58men, >60 women)     Objective:     Vitals: BP 118/72 (BP Location: Right Arm)   Pulse 67   Temp 97.8 F (36.6 C) (Oral)   Ht 5\' 10"  (1.778 m)   Wt 181 lb 12.8 oz (82.5 kg)   BMI 26.09 kg/m   Body mass index is 26.09 kg/m.  Advanced Directives 08/17/2018 11/25/2017 08/23/2017 07/06/2017 08/14/2016  Does Patient Have a Medical Advance Directive? Yes Yes Yes No Yes  Type of Paramedic of [place];Living will [place];Living will;Out of facility DNR (pink MOST or yellow form) [place];Living will - Living will;Healthcare Power of Attorney  Does patient want to make changes to medical advance directive? - No - Patient declined - - -  Copy of [place] in Chart? No - copy requested - - - -  Would patient like information on creating a medical advance directive? - - - No - Patient declined -    Tobacco Social History   Tobacco Use  Smoking Status Never Smoker  Smokeless Tobacco Never Used     Counseling given: Not Answered   Clinical Intake:  Pre-visit preparation completed: Yes  Pain : No/denies pain Pain Score: 0-No pain     Nutritional Status: BMI 25 -29 Overweight Nutritional Risks: None Diabetes: No  How often do you need to have someone help you when you read instructions, pamphlets, or other written materials from your doctor or pharmacy?: 1 - Never  Interpreter Needed?: No  Information entered by :: [facility], LPN  Past Medical History:  Diagnosis Date  . Medical history non-contributory   . Rosacea    Past Surgical History:  Procedure Laterality Date  . BREAST BIOPSY Left 2002   benign  . CATARACT EXTRACTION W/PHACO Right 07/06/2017   Procedure: CATARACT EXTRACTION PHACO AND  INTRAOCULAR LENS PLACEMENT (IOC);  Surgeon: Birder Robson, MD;  Location: [facility] ORS;  Service: Ophthalmology;  Laterality: Right;  [place] 00:34.2 AP% 12.7 CDE 4.33 Fluid Pack lot # [id number]  . NO PAST SURGERIES     Family History  Problem Relation Age of Onset  . Bladder Cancer Mother   . Breast cancer Mother   . Stroke Father   . Hyperlipidemia Father   . Breast cancer Sister 50  . Kidney Stones Brother   . Hyperlipidemia Brother   . Kidney Stones Brother   . Diverticulosis Brother    Social History   Socioeconomic History  . Marital status: Married    Spouse name: Not on file  . Number of children: 0  . Years of education: Not on file  . Highest education level: Master's degree (e.g., MA, MS, MEng, MEd, MSW, MBA)  Occupational History  . Occupation: Armed forces operational officer  Social Needs  . Financial resource strain: Not hard at all  . Food insecurity:    Worry: Never true    Inability: Never true  . Transportation needs:    Medical: No    Non-medical: No  Tobacco Use  . Smoking status: Never Smoker  . Smokeless tobacco: Never Used  Substance and Sexual Activity  . Alcohol use: Yes    Alcohol/week: 5.0 - 6.0 standard drinks    Types: 5 - 6 Glasses of wine per week  . Drug use:  No  . Sexual activity: Not on file  Lifestyle  . Physical activity:    Days per week: Not on file    Minutes per session: Not on file  . Stress: Only a little  Relationships  . Social connections:    Talks on phone: Not on file    Gets together: Not on file    Attends religious service: Not on file    Active member of club or organization: Not on file    Attends meetings of clubs or organizations: Not on file    Relationship status: Not on file  Other Topics Concern  . Not on file  Social History Narrative  . Not on file    Outpatient Encounter Medications as of 08/17/2018  Medication Sig  . Calcium Carb-Cholecalciferol (CALCIUM + D3) 600-200 MG-UNIT TABS Take 2 tablets by mouth daily.     . ferrous sulfate 325 (65 FE) MG tablet Take 325 mg by mouth daily with breakfast.  . FINACEA 15 % FOAM APPLY A SMALL AMOUNT ON THE SKIN DAILY  . mometasone (ELOCON) 0.1 % lotion INSTILL 4 DROPS IN BOTH EAR CANALS EVERY NIGHT AT BEDTIME AS NEEDED FOR DRY SKIN AND ITCHING  . doxycycline (VIBRA-TABS) 100 MG tablet Take 1 tablet (100 mg total) by mouth 2 (two) times daily.  . furosemide (LASIX) 20 MG tablet Take 1 tablet (20 mg total) by mouth daily as needed. (Patient not taking: Reported on 08/17/2018)  . meloxicam (MOBIC) 15 MG tablet Take 1 tablet (15 mg total) daily by mouth. (Patient not taking: Reported on 08/17/2018)  . potassium chloride (K-DUR) 10 MEQ tablet Take 1 tablet (10 mEq total) by mouth daily as needed. Anytime she takes furosemide take potassium (Patient not taking: Reported on 08/17/2018)  . RHOFADE 1 % CREA APPLY TO FACE QAM   No facility-administered encounter medications on file as of 08/17/2018.     Activities of Daily Living In your present state of health, do you have any difficulty performing the following activities: 08/17/2018 08/23/2017  Hearing? N N  Vision? N N  Difficulty concentrating or making decisions? N N  Walking or climbing stairs? N N  Dressing or bathing? N N  Doing errands, shopping? N N  Preparing Food and eating ? N -  Using the Toilet? N -  In the past six months, have you accidently leaked urine? Y -  Comment Occasionally with urges. -  Do you have problems with loss of bowel control? N -  Managing your Medications? N -  Managing your Finances? N -  Housekeeping or managing your Housekeeping? N -  Some recent data might be hidden    Patient Care Team: Mar Daring, PA-C as PCP - General (Family Medicine) Carloyn Manner, MD as Referring Physician (Otolaryngology) Birder Robson, MD as Referring Physician (Ophthalmology) Kem Parkinson, MD (Ophthalmology)    Assessment:   This is a routine wellness examination for  Jennafer.  Exercise Activities and Dietary recommendations Current Exercise Habits: Home exercise routine, Type of exercise: walking, Time (Minutes): 25, Frequency (Times/Week): 2, Weekly Exercise (Minutes/Week): 50, Intensity: Mild, Exercise limited by: orthopedic condition(s)  Goals    . Exercise 150 minutes per week (moderate activity)    . Exercise 3x per week (30 min per time)     Recommend to start walking 3 days a week for at least 30 minutes at a time.        Fall Risk Fall Risk  08/17/2018 08/23/2017 06/03/2017 08/14/2015  Falls in the past year? No Yes No No   Is the patient's home free of loose throw rugs in walkways, pet beds, electrical cords, etc?   yes      Grab bars in the bathroom? no      Handrails on the stairs?   yes      Adequate lighting?   yes  Timed Get Up and Go performed: N/A  Depression Screen PHQ 2/9 Scores 08/17/2018 08/23/2017 08/14/2015  PHQ - 2 Score 0 0 0     Cognitive Function     6CIT Screen 08/17/2018  What Year? 0 points  What month? 0 points  What time? 0 points  Count back from 20 0 points  Months in reverse 0 points  Repeat phrase 2 points  Total Score 2    Immunization History  Administered Date(s) Administered  . Influenza, High Dose Seasonal PF 08/23/2017, 08/17/2018  . Influenza,inj,Quad PF,6+ Mos 08/08/2014, 08/14/2015, 08/14/2016  . Pneumococcal Conjugate-13 08/23/2017  . Pneumococcal Polysaccharide-23 08/08/2014  . Td 02/14/2005  . Zoster 12/18/2011  . Zoster Recombinat (Shingrix) 08/23/2017, 11/05/2017    Qualifies for Shingles Vaccine? Due for Shingles vaccine. Declined my offer to administer today. Education has been provided regarding the importance of this vaccine. Pt has been advised to call her insurance company to determine her out of pocket expense. Advised she may also receive this vaccine at her local pharmacy or Health Dept. Verbalized acceptance and understanding.  Screening Tests Health Maintenance  Topic Date  Due  . TETANUS/TDAP  02/15/2015  . COLONOSCOPY  02/19/2018  . PNA vac Low Risk Adult (2 of 2 - PPSV23) 08/09/2019  . MAMMOGRAM  09/28/2019  . INFLUENZA VACCINE  Completed  . DEXA SCAN  Completed  . Hepatitis C Screening  Completed    Cancer Screenings: Lung: Low Dose CT Chest recommended if Age 85-80 years, 30 pack-year currently smoking OR have quit w/in 15years. Patient does not qualify. Breast:  Up to date on Mammogram? Yes   Up to date of Bone Density/Dexa? Yes Colorectal: Referral sent today.   Additional Screenings:  Hepatitis C Screening: Up to date     Plan:  I have personally reviewed and addressed the Medicare Annual Wellness questionnaire and have noted the following in the patient's chart:  A. Medical and social history B. Use of alcohol, tobacco or illicit drugs  C. Current medications and supplements D. Functional ability and status E.  Nutritional status F.  Physical activity G. Advance directives H. List of other physicians I.  Hospitalizations, surgeries, and ER visits in previous 12 months J.  [place] such as hearing and vision if needed, cognitive and depression L. Referrals and appointments - none  In addition, I have reviewed and discussed with patient certain preventive protocols, quality metrics, and best practice recommendations. A written personalized care plan for preventive services as well as general preventive health recommendations were provided to patient.  See attached scanned questionnaire for additional information.   Signed,  Fabio Neighbors, LPN Nurse Health Advisor   Nurse Recommendations: Pt declined the tetanus vaccine today. Referral for colonoscopy sent today.

## 2018-08-17 NOTE — Progress Notes (Signed)
Patient: Allison [name], Female    DOB: [birth year], 67 y.o.   MRN: [id number] Visit Date: 08/17/2018  Today's Provider: Mar Daring, PA-C   Chief Complaint  Patient presents with  . Annual Exam   Subjective:     [name] is a 67 y.o. female. She feels well. She reports exercising . She reports she is sleeping well. Patient was seen with [facility] today for AWE. -----------------------------------------------------------   Review of Systems  Cardiovascular: Positive for leg swelling.  Genitourinary: Positive for urgency.  Musculoskeletal: Positive for joint swelling.  All other systems reviewed and are negative.   Social History   Socioeconomic History  . Marital status: Married    Spouse name: Not on file  . Number of children: 0  . Years of education: Not on file  . Highest education level: Master's degree (e.g., MA, MS, MEng, MEd, MSW, MBA)  Occupational History  . Occupation: Armed forces operational officer  Social Needs  . Financial resource strain: Not hard at all  . Food insecurity:    Worry: Never true    Inability: Never true  . Transportation needs:    Medical: No    Non-medical: No  Tobacco Use  . Smoking status: Never Smoker  . Smokeless tobacco: Never Used  Substance and Sexual Activity  . Alcohol use: Yes    Alcohol/week: 5.0 - 6.0 standard drinks    Types: 5 - 6 Glasses of wine per week  . Drug use: No  . Sexual activity: Not on file  Lifestyle  . Physical activity:    Days per week: Not on file    Minutes per session: Not on file  . Stress: Only a little  Relationships  . Social connections:    Talks on phone: Not on file    Gets together: Not on file    Attends religious service: Not on file    Active member of club or organization: Not on file    Attends meetings of clubs or organizations: Not on file    Relationship status: Not on file  . Intimate partner violence:    Fear of current or ex partner: Not on file      Emotionally abused: Not on file    Physically abused: Not on file    Forced sexual activity: Not on file  Other Topics Concern  . Not on file  Social History Narrative  . Not on file    Past Medical History:  Diagnosis Date  . Medical history non-contributory   . Rosacea      Patient Active Problem List   Diagnosis Date Noted  . Blood in the urine 08/13/2015  . H/O hyperthyroidism 08/13/2015  . H/O amblyopia 08/13/2015    Past Surgical History:  Procedure Laterality Date  . BREAST BIOPSY Left 2002   benign  . CATARACT EXTRACTION W/PHACO Right 07/06/2017   Procedure: CATARACT EXTRACTION PHACO AND INTRAOCULAR LENS PLACEMENT (IOC);  Surgeon: Birder Robson, MD;  Location: [facility] ORS;  Service: Ophthalmology;  Laterality: Right;  [place] 00:34.2 AP% 12.7 CDE 4.33 Fluid Pack lot # [id number]  . NO PAST SURGERIES      Her family history includes Bladder Cancer in her mother; Breast cancer in her mother; Breast cancer (age of onset: 5) in her sister; Diverticulosis in her brother; Hyperlipidemia in her brother and father; Kidney Stones in her brother and brother; Stroke in her father.      Current Outpatient Medications:  .  Calcium Carb-Cholecalciferol (CALCIUM + D3) 600-200 MG-UNIT TABS, Take 2 tablets by mouth daily. , Disp: , Rfl:  .  doxycycline (VIBRA-TABS) 100 MG tablet, Take 1 tablet (100 mg total) by mouth 2 (two) times daily., Disp: 20 tablet, Rfl: 0 .  ferrous sulfate 325 (65 FE) MG tablet, Take 325 mg by mouth daily with breakfast., Disp: , Rfl:  .  FINACEA 15 % FOAM, APPLY A SMALL AMOUNT ON THE SKIN DAILY, Disp: , Rfl: 11 .  furosemide (LASIX) 20 MG tablet, Take 1 tablet (20 mg total) by mouth daily as needed. (Patient not taking: Reported on 08/17/2018), Disp: 30 tablet, Rfl: 3 .  meloxicam (MOBIC) 15 MG tablet, Take 1 tablet (15 mg total) daily by mouth. (Patient not taking: Reported on 08/17/2018), Disp: 30 tablet, Rfl: 3 .  mometasone (ELOCON) 0.1 % lotion, INSTILL 4  DROPS IN BOTH EAR CANALS EVERY NIGHT AT BEDTIME AS NEEDED FOR DRY SKIN AND ITCHING, Disp: , Rfl: 12 .  potassium chloride (K-DUR) 10 MEQ tablet, Take 1 tablet (10 mEq total) by mouth daily as needed. Anytime she takes furosemide take potassium (Patient not taking: Reported on 08/17/2018), Disp: 30 tablet, Rfl: 3 .  RHOFADE 1 % CREA, APPLY TO FACE QAM, Disp: , Rfl: 11  Patient Care Team: Mar Daring, PA-C as PCP - General (Family Medicine) Carloyn Manner, MD as Referring Physician (Otolaryngology) Birder Robson, MD as Referring Physician (Ophthalmology) Kem Parkinson, MD (Ophthalmology)     Objective:  Vitals: BP 118/72 (BP Location: Right Arm)   Pulse 67   Temp 97.8 F (36.6 C) (Oral)   Ht 5\' 10"  (1.778 m)   Wt 181 lb 12.8 oz (82.5 kg)   BMI 26.09 kg/m   Body mass index is 26.09 kg/m.  Physical Exam  Constitutional: She is oriented to person, place, and time. She appears well-developed and well-nourished. No distress.  HENT:  Head: Normocephalic and atraumatic.  Right Ear: Hearing, tympanic membrane, external ear and ear canal normal.  Left Ear: Hearing, tympanic membrane, external ear and ear canal normal.  Nose: Nose normal.  Mouth/Throat: Uvula is midline, oropharynx is clear and moist and mucous membranes are normal. No oropharyngeal exudate.    Eyes: Pupils are equal, round, and reactive to light. Conjunctivae and EOM are normal. Right eye exhibits no discharge. Left eye exhibits no discharge. No scleral icterus.  Neck: Normal range of motion. Neck supple. No JVD present. Carotid bruit is not present. No tracheal deviation present. No thyromegaly present.  Cardiovascular: Normal rate, regular rhythm, normal heart sounds and intact distal pulses. Exam reveals no gallop and no friction rub.  No murmur heard. Pulmonary/Chest: Effort normal and breath sounds normal. No respiratory distress. She has no wheezes. She has no rales. She exhibits no tenderness.    Abdominal: Soft. Bowel sounds are normal. She exhibits no distension and no mass. There is no tenderness. There is no rebound and no guarding.  Musculoskeletal: Normal range of motion. She exhibits no edema or tenderness.  Lymphadenopathy:    She has no cervical adenopathy.  Neurological: She is alert and oriented to person, place, and time.  Skin: Skin is warm and dry. No rash noted. She is not diaphoretic.  Psychiatric: She has a normal mood and affect. Her behavior is normal. Judgment and thought content normal.  Vitals reviewed.   Activities of Daily Living In your present state of health, do you have any difficulty performing the following activities: 08/17/2018 08/23/2017  Hearing? N N  Vision? N N  Difficulty concentrating or making decisions? N N  Walking or climbing stairs? N N  Dressing or bathing? N N  Doing errands, shopping? N N  Preparing Food and eating ? N -  Using the Toilet? N -  In the past six months, have you accidently leaked urine? Y -  Comment Occasionally with urges. -  Do you have problems with loss of bowel control? N -  Managing your Medications? N -  Managing your Finances? N -  Housekeeping or managing your Housekeeping? N -  Some recent data might be hidden    Fall Risk Assessment Fall Risk  08/17/2018 08/23/2017 06/03/2017 08/14/2015  Falls in the past year? No Yes No No     Depression Screen PHQ 2/9 Scores 08/17/2018 08/23/2017 08/14/2015  PHQ - 2 Score 0 0 0    6CIT Screen 08/17/2018  What Year? 0 points  What month? 0 points  What time? 0 points  Count back from 20 0 points  Months in reverse 0 points  Repeat phrase 2 points  Total Score 2       Assessment & Plan:    Annual Physical Reviewed patient's Family Medical History Reviewed and updated list of patient's medical providers Assessment of cognitive impairment was done Assessed patient's functional ability Established a written schedule for health screening [place] Completed and Reviewed  Exercise Activities and Dietary recommendations Goals    . Exercise 150 minutes per week (moderate activity)    . Exercise 3x per week (30 min per time)     Recommend to start walking 3 days a week for at least 30 minutes at a time.        Immunization History  Administered Date(s) Administered  . Influenza, High Dose Seasonal PF 08/23/2017, 08/17/2018  . Influenza,inj,Quad PF,6+ Mos 08/08/2014, 08/14/2015, 08/14/2016  . Pneumococcal Conjugate-13 08/23/2017  . Pneumococcal Polysaccharide-23 08/08/2014  . Td 02/14/2005  . Zoster 12/18/2011  . Zoster Recombinat (Shingrix) 08/23/2017, 11/05/2017    Health Maintenance  Topic Date Due  . TETANUS/TDAP  02/15/2015  . COLONOSCOPY  02/19/2018  . PNA vac Low Risk Adult (2 of 2 - PPSV23) 08/09/2019  . MAMMOGRAM  09/28/2019  . INFLUENZA VACCINE  Completed  . DEXA SCAN  Completed  . Hepatitis C Screening  Completed     Discussed health benefits of physical activity, and encouraged her to engage in regular exercise appropriate for her age and condition.    1. Annual physical exam Normal physical exam today. Will check labs as below and f/u pending lab results. If labs are stable and WNL she will not need to have these rechecked for one year at her next annual physical exam. She is to call the office in the meantime if she has any acute issue, questions or concerns. - CBC with Differential/Platelet - Comprehensive metabolic panel - Hemoglobin A1c - Lipid panel - TSH  2. Breast cancer screening Breast exam today was normal. There is no family history of breast cancer. She does perform regular self breast exams. Mammogram was ordered as below. Information for [facility] Breast clinic was given to patient so she may schedule her mammogram at her convenience. - MM 3D SCREEN BREAST BILATERAL; Future  3. Colon cancer screening Referral placed by [facility].   4. H/O hyperthyroidism Will check labs as below  and f/u pending results. - TSH  ------------------------------------------------------------------------------------------------------------    Mar Daring, PA-C  [facility]

## 2018-08-18 LAB — CBC WITH DIFFERENTIAL/PLATELET
Basophils Absolute: 0 10*3/uL (ref 0.0–0.2)
Basos: 1 %
EOS (ABSOLUTE): 0.1 10*3/uL (ref 0.0–0.4)
Eos: 2 %
HEMOGLOBIN: 12.4 g/dL (ref 11.1–15.9)
Hematocrit: 37.4 % (ref 34.0–46.6)
IMMATURE GRANS (ABS): 0 10*3/uL (ref 0.0–0.1)
IMMATURE GRANULOCYTES: 0 %
LYMPHS: 26 %
Lymphocytes Absolute: 1.1 10*3/uL (ref 0.7–3.1)
MCH: 31.2 pg (ref 26.6–33.0)
MCHC: 33.2 g/dL (ref 31.5–35.7)
MCV: 94 fL (ref 79–97)
MONOCYTES: 9 %
Monocytes Absolute: 0.4 10*3/uL (ref 0.1–0.9)
NEUTROS ABS: 2.7 10*3/uL (ref 1.4–7.0)
NEUTROS PCT: 62 %
Platelets: 300 10*3/uL (ref 150–450)
RBC: 3.98 x10E6/uL (ref 3.77–5.28)
RDW: 12.5 % (ref 12.3–15.4)
WBC: 4.3 10*3/uL (ref 3.4–10.8)

## 2018-08-18 LAB — COMPREHENSIVE METABOLIC PANEL
ALBUMIN: 4.7 g/dL (ref 3.6–4.8)
ALT: 13 IU/L (ref 0–32)
AST: 18 IU/L (ref 0–40)
Albumin/Globulin Ratio: 2.2 (ref 1.2–2.2)
Alkaline Phosphatase: 57 IU/L (ref 39–117)
BUN / CREAT RATIO: 17 (ref 12–28)
BUN: 12 mg/dL (ref 8–27)
Bilirubin Total: 0.5 mg/dL (ref 0.0–1.2)
CALCIUM: 9.7 mg/dL (ref 8.7–10.3)
CO2: 24 mmol/L (ref 20–29)
CREATININE: 0.71 mg/dL (ref 0.57–1.00)
Chloride: 99 mmol/L (ref 96–106)
GFR, EST AFRICAN AMERICAN: 103 mL/min/{1.73_m2} (ref >59)
GFR, EST NON AFRICAN AMERICAN: 89 mL/min/{1.73_m2} (ref >59)
GLUCOSE: 85 mg/dL (ref 65–99)
Globulin, Total: 2.1 g/dL (ref 1.5–4.5)
Potassium: 4.4 mmol/L (ref 3.5–5.2)
Sodium: 138 mmol/L (ref 134–144)
TOTAL PROTEIN: 6.8 g/dL (ref 6.0–8.5)

## 2018-08-18 LAB — LIPID PANEL
Chol/HDL Ratio: 2.4 ratio (ref 0.0–4.4)
Cholesterol, Total: 208 mg/dL — ABNORMAL HIGH (ref 100–199)
HDL: 87 mg/dL (ref >39)
LDL CALC: 111 mg/dL — AB (ref 0–99)
TRIGLYCERIDES: 48 mg/dL (ref 0–149)
VLDL CHOLESTEROL CAL: 10 mg/dL (ref 5–40)

## 2018-08-18 LAB — TSH: TSH: 1.67 u[IU]/mL (ref 0.450–4.500)

## 2018-08-18 LAB — HEMOGLOBIN A1C
Est. average glucose Bld gHb Est-mCnc: 103 mg/dL
Hgb A1c MFr Bld: 5.2 % (ref 4.8–5.6)

## 2018-08-19 ENCOUNTER — Other Ambulatory Visit: Payer: Self-pay

## 2018-08-19 DIAGNOSIS — Z1211 Encounter for screening for malignant neoplasm of colon: Secondary | ICD-10-CM

## 2018-08-24 ENCOUNTER — Encounter: Payer: Medicare Other | Admitting: Physician Assistant

## 2018-08-25 ENCOUNTER — Encounter: Payer: Medicare Other | Admitting: Physician Assistant

## 2018-08-25 ENCOUNTER — Encounter: Payer: Self-pay | Admitting: Physician Assistant

## 2018-09-30 ENCOUNTER — Ambulatory Visit: Payer: Medicare Other

## 2018-09-30 ENCOUNTER — Encounter: Payer: Medicare Other | Admitting: Physician Assistant

## 2018-10-07 ENCOUNTER — Ambulatory Visit
Admission: RE | Admit: 2018-10-07 | Discharge: 2018-10-07 | Disposition: A | Discharge status: Home / Self Care | Payer: Medicare Other | Source: Ambulatory Visit | Attending: Physician Assistant | Admitting: Physician Assistant

## 2018-10-07 ENCOUNTER — Telehealth: Payer: Self-pay

## 2018-10-07 DIAGNOSIS — Z1231 Encounter for screening mammogram for malignant neoplasm of breast: Secondary | ICD-10-CM | POA: Diagnosis not present

## 2018-10-07 DIAGNOSIS — Z1239 Encounter for other screening for malignant neoplasm of breast: Secondary | ICD-10-CM | POA: Diagnosis present

## 2018-10-07 NOTE — Telephone Encounter (Signed)
-----   Message from Mar Daring, PA-C sent at 10/07/2018 12:00 PM EST ----- Normal mammogram. Repeat screening in one year.

## 2018-10-07 NOTE — Telephone Encounter (Signed)
LVMTRC 

## 2018-10-10 NOTE — Telephone Encounter (Signed)
Patient was via voicemail per DPR and was advised to call the office if have any questions or concerns.

## 2018-10-11 ENCOUNTER — Encounter
Admission: RE | Disposition: A | Discharge status: Home / Self Care | Payer: Self-pay | Source: Ambulatory Visit | Attending: Gastroenterology

## 2018-10-11 ENCOUNTER — Ambulatory Visit: Payer: Medicare Other | Admitting: Certified Registered Nurse Anesthetist

## 2018-10-11 ENCOUNTER — Ambulatory Visit
Admission: RE | Admit: 2018-10-11 | Discharge: 2018-10-11 | Disposition: A | Discharge status: Home / Self Care | Payer: Medicare Other | Source: Ambulatory Visit | Attending: Gastroenterology | Admitting: Gastroenterology

## 2018-10-11 ENCOUNTER — Encounter: Payer: Self-pay | Admitting: *Deleted

## 2018-10-11 DIAGNOSIS — Z841 Family history of disorders of kidney and ureter: Secondary | ICD-10-CM | POA: Insufficient documentation

## 2018-10-11 DIAGNOSIS — Z1211 Encounter for screening for malignant neoplasm of colon: Secondary | ICD-10-CM | POA: Diagnosis present

## 2018-10-11 DIAGNOSIS — Z881 Allergy status to other antibiotic agents status: Secondary | ICD-10-CM | POA: Insufficient documentation

## 2018-10-11 DIAGNOSIS — D125 Benign neoplasm of sigmoid colon: Secondary | ICD-10-CM

## 2018-10-11 DIAGNOSIS — K5730 Diverticulosis of large intestine without perforation or abscess without bleeding: Secondary | ICD-10-CM | POA: Diagnosis not present

## 2018-10-11 DIAGNOSIS — Z9841 Cataract extraction status, right eye: Secondary | ICD-10-CM | POA: Diagnosis not present

## 2018-10-11 DIAGNOSIS — Z8379 Family history of other diseases of the digestive system: Secondary | ICD-10-CM | POA: Diagnosis not present

## 2018-10-11 DIAGNOSIS — Z79899 Other long term (current) drug therapy: Secondary | ICD-10-CM | POA: Diagnosis not present

## 2018-10-11 DIAGNOSIS — Z91018 Allergy to other foods: Secondary | ICD-10-CM | POA: Insufficient documentation

## 2018-10-11 DIAGNOSIS — K635 Polyp of colon: Secondary | ICD-10-CM

## 2018-10-11 DIAGNOSIS — Z803 Family history of malignant neoplasm of breast: Secondary | ICD-10-CM | POA: Insufficient documentation

## 2018-10-11 DIAGNOSIS — L719 Rosacea, unspecified: Secondary | ICD-10-CM | POA: Insufficient documentation

## 2018-10-11 DIAGNOSIS — Z823 Family history of stroke: Secondary | ICD-10-CM | POA: Diagnosis not present

## 2018-10-11 DIAGNOSIS — Z8052 Family history of malignant neoplasm of bladder: Secondary | ICD-10-CM | POA: Diagnosis not present

## 2018-10-11 DIAGNOSIS — Z8249 Family history of ischemic heart disease and other diseases of the circulatory system: Secondary | ICD-10-CM | POA: Insufficient documentation

## 2018-10-11 HISTORY — PX: COLONOSCOPY WITH PROPOFOL: SHX5780

## 2018-10-11 SURGERY — COLONOSCOPY WITH PROPOFOL
Anesthesia: General

## 2018-10-11 MED ORDER — PROPOFOL 10 MG/ML IV BOLUS
INTRAVENOUS | Status: DC | PRN
  Administered 2018-10-11 (×2): 20 mg via INTRAVENOUS
  Administered 2018-10-11: 80 mg via INTRAVENOUS

## 2018-10-11 MED ORDER — LIDOCAINE HCL (PF) 2 % IJ SOLN
INTRAMUSCULAR | Status: AC
  Filled 2018-10-11: qty 10

## 2018-10-11 MED ORDER — PROPOFOL 500 MG/50ML IV EMUL
INTRAVENOUS | Status: DC | PRN
  Administered 2018-10-11: 130 ug/kg/min via INTRAVENOUS

## 2018-10-11 MED ORDER — PROPOFOL 500 MG/50ML IV EMUL
INTRAVENOUS | Status: AC
  Filled 2018-10-11: qty 50

## 2018-10-11 MED ORDER — LIDOCAINE HCL (CARDIAC) PF 100 MG/5ML IV SOSY
PREFILLED_SYRINGE | INTRAVENOUS | Status: DC | PRN
  Administered 2018-10-11: 50 mg via INTRAVENOUS

## 2018-10-11 MED ORDER — SODIUM CHLORIDE 0.9 % IV SOLN
INTRAVENOUS | Status: DC
  Administered 2018-10-11: 08:00:00 via INTRAVENOUS

## 2018-10-11 NOTE — Anesthesia Preprocedure Evaluation (Signed)
Anesthesia Evaluation  Patient identified by MRN, date of birth, ID band Patient awake    Reviewed: Allergy & Precautions, H&P , NPO status , Patient's Chart, lab work & pertinent test results, reviewed documented beta blocker date and time   History of Anesthesia Complications Negative for: history of anesthetic complications  Airway Mallampati: III  TM Distance: >3 FB Neck ROM: full    Dental  (+) Dental Advidsory Given, Caps, Teeth Intact   Pulmonary neg shortness of breath, neg sleep apnea, neg COPD, Recent URI , Residual Cough,           Cardiovascular Exercise Tolerance: Good negative cardio ROS       Neuro/Psych negative neurological ROS  negative psych ROS   GI/Hepatic negative GI ROS, Neg liver ROS,   Endo/Other  negative endocrine ROS  Renal/GU negative Renal ROS  negative genitourinary   Musculoskeletal   Abdominal   Peds  Hematology negative hematology ROS (+)   Anesthesia Other Findings Past Medical History: No date: Medical history non-contributory No date: Rosacea   Reproductive/Obstetrics negative OB ROS                             Anesthesia Physical Anesthesia Plan  ASA: II  Anesthesia Plan: General   Post-op Pain Management:    Induction: Intravenous  PONV Risk Score and Plan: 3 and Propofol infusion and TIVA  Airway Management Planned: Natural Airway and Nasal Cannula  Additional Equipment:   Intra-op Plan:   Post-operative Plan:   Informed Consent: I have reviewed the patients History and Physical, chart, labs and discussed the procedure including the risks, benefits and alternatives for the proposed anesthesia with the patient or authorized representative who has indicated his/her understanding and acceptance.   Dental Advisory Given  Plan Discussed with: Anesthesiologist, CRNA and Surgeon  Anesthesia Plan Comments:         Anesthesia  Quick Evaluation

## 2018-10-11 NOTE — H&P (Signed)
Lucilla Lame, MD [facility] [address], [place], [place] [address] Phone: [phone] Fax : [phone]  Primary Care Physician:  Mar Daring, PA-C Primary Gastroenterologist:  Dr. Allen Norris  Pre-Procedure History & Physical: HPI:  Allison [name] is a 67 y.o. female is here for a screening colonoscopy.   Past Medical History:  Diagnosis Date  . Medical history non-contributory   . Rosacea     Past Surgical History:  Procedure Laterality Date  . BREAST BIOPSY Left 2002   benign  . CATARACT EXTRACTION W/PHACO Right 07/06/2017   Procedure: CATARACT EXTRACTION PHACO AND INTRAOCULAR LENS PLACEMENT (IOC);  Surgeon: Birder Robson, MD;  Location: [facility] ORS;  Service: Ophthalmology;  Laterality: Right;  [place] 00:34.2 AP% 12.7 CDE 4.33 Fluid Pack lot # [id number]  . NO PAST SURGERIES      Prior to Admission medications   Medication Sig Start Date End Date Taking? Authorizing Provider  mometasone (ELOCON) 0.1 % lotion INSTILL 4 DROPS IN BOTH EAR CANALS EVERY NIGHT AT BEDTIME AS NEEDED FOR DRY SKIN AND ITCHING 06/09/18  Yes [provider]  Calcium Carb-Cholecalciferol (CALCIUM + D3) 600-200 MG-UNIT TABS Take 2 tablets by mouth daily.     [provider]  doxycycline (VIBRA-TABS) 100 MG tablet Take 1 tablet (100 mg total) by mouth 2 (two) times daily. Patient not taking: Reported on 10/11/2018 05/16/18   Mar Daring, PA-C  ferrous sulfate 325 (65 FE) MG tablet Take 325 mg by mouth daily with breakfast.    [provider]  FINACEA 15 % FOAM APPLY A SMALL AMOUNT ON THE SKIN DAILY 06/19/15   [provider]  furosemide (LASIX) 20 MG tablet Take 1 tablet (20 mg total) by mouth daily as needed. Patient not taking: Reported on 08/17/2018 04/25/18   Mar Daring, PA-C  meloxicam (MOBIC) 15 MG tablet Take 1 tablet (15 mg total) daily by mouth. Patient not taking: Reported on 08/17/2018 09/28/17   Mar Daring, PA-C  potassium  chloride (K-DUR) 10 MEQ tablet Take 1 tablet (10 mEq total) by mouth daily as needed. Anytime she takes furosemide take potassium Patient not taking: Reported on 08/17/2018 04/25/18   Mar Daring, PA-C  RHOFADE 1 % CREA APPLY TO FACE QAM 07/28/16   [provider]    Allergies as of 08/19/2018 - Review Complete 08/17/2018  Allergen Reaction Noted  . Strawberry extract  08/13/2015  . Tetracycline Rash 08/13/2015    Family History  Problem Relation Age of Onset  . Bladder Cancer Mother   . Breast cancer Mother   . Stroke Father   . Hyperlipidemia Father   . Breast cancer Sister 2  . Kidney Stones Brother   . Hyperlipidemia Brother   . Kidney Stones Brother   . Diverticulosis Brother     Social History   Socioeconomic History  . Marital status: Married    Spouse name: Not on file  . Number of children: 0  . Years of education: Not on file  . Highest education level: Master's degree (e.g., MA, MS, MEng, MEd, MSW, MBA)  Occupational History  . Occupation: Armed forces operational officer  Social Needs  . Financial resource strain: Not hard at all  . Food insecurity:    Worry: Never true    Inability: Never true  . Transportation needs:    Medical: No    Non-medical: No  Tobacco Use  . Smoking status: Never Smoker  . Smokeless tobacco: Never Used  Substance and  Sexual Activity  . Alcohol use: Yes    Alcohol/week: 5.0 - 6.0 standard drinks    Types: 5 - 6 Glasses of wine per week  . Drug use: No  . Sexual activity: Not on file  Lifestyle  . Physical activity:    Days per week: Not on file    Minutes per session: Not on file  . Stress: Only a little  Relationships  . Social connections:    Talks on phone: Not on file    Gets together: Not on file    Attends religious service: Not on file    Active member of club or organization: Not on file    Attends meetings of clubs or organizations: Not on file    Relationship status: Not on file  . Intimate partner violence:     Fear of current or ex partner: Not on file    Emotionally abused: Not on file    Physically abused: Not on file    Forced sexual activity: Not on file  Other Topics Concern  . Not on file  Social History Narrative  . Not on file    Review of Systems: See HPI, otherwise negative ROS  Physical Exam: BP 121/86   Pulse 83   Temp (!) 96.3 F (35.7 C) (Tympanic)   Resp 12   Ht 5\' 9"  (1.753 m)   Wt 79.4 kg   BMI 25.84 kg/m  General:   Alert,  pleasant and cooperative in NAD Head:  Normocephalic and atraumatic. Neck:  Supple; no masses or thyromegaly. Lungs:  Clear throughout to auscultation.    Heart:  Regular rate and rhythm. Abdomen:  Soft, nontender and nondistended. Normal bowel sounds, without guarding, and without rebound.   Neurologic:  Alert and  oriented x4;  grossly normal neurologically.  Impression/Plan: Allison [name] is now here to undergo a screening colonoscopy.  Risks, benefits, and alternatives regarding colonoscopy have been reviewed with the patient.  Questions have been answered.  All parties agreeable.

## 2018-10-11 NOTE — Op Note (Signed)
[facility] Gastroenterology Patient Name: Allison [name] Procedure Date: 10/11/2018 7:57 AM MRN: [id number] Account #: [id number] Date of Birth: Mar 16, 1951 Admit Type: Outpatient Age: 67 Room: [facility] ENDO ROOM 4 Gender: Female Note Status: Finalized Procedure:            Colonoscopy Indications:          Screening for colorectal malignant neoplasm Providers:            Lucilla Lame MD, MD Referring MD:         Mar Daring (Referring MD) Medicines:            Propofol per Anesthesia Complications:        No immediate complications. Procedure:            Pre-Anesthesia Assessment:                       - Prior to the procedure, a History and Physical was                        performed, and patient medications and allergies were                        reviewed. The patient's tolerance of previous                        anesthesia was also reviewed. The risks and benefits of                        the procedure and the sedation options and risks were                        discussed with the patient. All questions were                        answered, and informed consent was obtained. Prior                        Anticoagulants: The patient has taken no previous                        anticoagulant or antiplatelet agents. ASA Grade                        Assessment: II - A patient with mild systemic disease.                        After reviewing the risks and benefits, the patient was                        deemed in satisfactory condition to undergo the                        procedure.                       After obtaining informed consent, the colonoscope was                        passed under direct vision. Throughout the procedure,  the patient's blood pressure, pulse, and oxygen                        saturations were monitored continuously. The                        Colonoscope was introduced through the anus and                advanced to the the cecum, identified by appendiceal                        orifice and ileocecal valve. The colonoscopy was                        performed without difficulty. The patient tolerated the                        procedure well. The quality of the bowel preparation                        was excellent. Findings:      The perianal and digital rectal examinations were normal.      A 2 mm polyp was found in the sigmoid colon. The polyp was sessile. The       polyp was removed with a cold biopsy forceps. Resection and retrieval       were complete.      Multiple small-mouthed diverticula were found in the sigmoid colon. Impression:           - One 2 mm polyp in the sigmoid colon, removed with a                        cold biopsy forceps. Resected and retrieved.                       - Diverticulosis in the sigmoid colon. Recommendation:       - Discharge patient to home.                       - Resume previous diet.                       - Continue present medications.                       - Await pathology results.                       - Repeat colonoscopy in 5 years if polyp adenoma and 10                        years if hyperplastic Procedure Code(s):    --- Professional ---                       [phone], Colonoscopy, flexible; with biopsy, single or                        multiple Diagnosis Code(s):    --- Professional ---                       Z12.11, Encounter for  screening for malignant neoplasm                        of colon                       D12.5, Benign neoplasm of sigmoid colon CPT copyright 2018 American Medical Association. All rights reserved. The codes documented in this report are preliminary and upon coder review may  be revised to meet current compliance requirements. Lucilla Lame MD, MD 10/11/2018 8:28:55 AM This report has been signed electronically. Number of Addenda: 0 Note Initiated On: 10/11/2018 7:57 AM Scope Withdrawal Time: 0  hours 9 minutes 39 seconds  Total Procedure Duration: 0 hours 18 minutes 45 seconds       [facility]

## 2018-10-11 NOTE — Transfer of Care (Signed)
Immediate Anesthesia Transfer of Care Note  Patient: Allison [name]  Procedure(s) Performed: COLONOSCOPY WITH PROPOFOL (N/A )  Patient Location: PACU and Endoscopy Unit  Anesthesia Type:General  Level of Consciousness: awake, alert , oriented and patient cooperative  Airway & Oxygen Therapy: Patient Spontanous Breathing  Post-op Assessment: Report given to RN and Post -op Vital signs reviewed and stable  Post vital signs: Reviewed and stable  Last Vitals:  Vitals Value Taken Time  BP    Temp 36.2 C 10/11/2018  8:20 AM  Pulse 88 10/11/2018  8:28 AM  Resp 22 10/11/2018  8:28 AM  SpO2 98 % 10/11/2018  8:28 AM  Vitals shown include unvalidated device data.  Last Pain:  Vitals:   10/11/18 0820  TempSrc: Tympanic         Complications: No apparent anesthesia complications

## 2018-10-11 NOTE — Anesthesia Post-op Follow-up Note (Signed)
Anesthesia QCDR form completed.        

## 2018-10-12 ENCOUNTER — Encounter: Payer: Self-pay | Admitting: Gastroenterology

## 2018-10-12 LAB — SURGICAL PATHOLOGY

## 2018-10-12 NOTE — Anesthesia Postprocedure Evaluation (Signed)
Anesthesia Post Note  Patient: Allison [name]  Procedure(s) Performed: COLONOSCOPY WITH PROPOFOL (N/A )  Patient location during evaluation: Endoscopy Anesthesia Type: General Level of consciousness: awake and alert Pain management: pain level controlled Vital Signs Assessment: post-procedure vital signs reviewed and stable Respiratory status: spontaneous breathing, nonlabored ventilation, respiratory function stable and patient connected to nasal cannula oxygen Cardiovascular status: blood pressure returned to baseline and stable Postop Assessment: no apparent nausea or vomiting Anesthetic complications: no     Last Vitals:  Vitals:   10/11/18 0840 10/11/18 0850  BP: 119/67 118/71  Pulse: 76 68  Resp: 20 16  Temp:    SpO2: 99% 100%    Last Pain:  Vitals:   10/11/18 0820  TempSrc: Tympanic                 Martha Clan

## 2018-10-16 ENCOUNTER — Encounter: Payer: Self-pay | Admitting: Gastroenterology

## 2019-02-27 IMAGING — MG DIGITAL SCREENING BILATERAL MAMMOGRAM WITH TOMO AND CAD
6 of 10 series · 6 of 30 positions shown · non-contrast
Comparison: Previous exam(s).

CLINICAL DATA: Screening.

EXAM:
DIGITAL SCREENING BILATERAL MAMMOGRAM WITH TOMO AND CAD

[R MLO synth-2D]
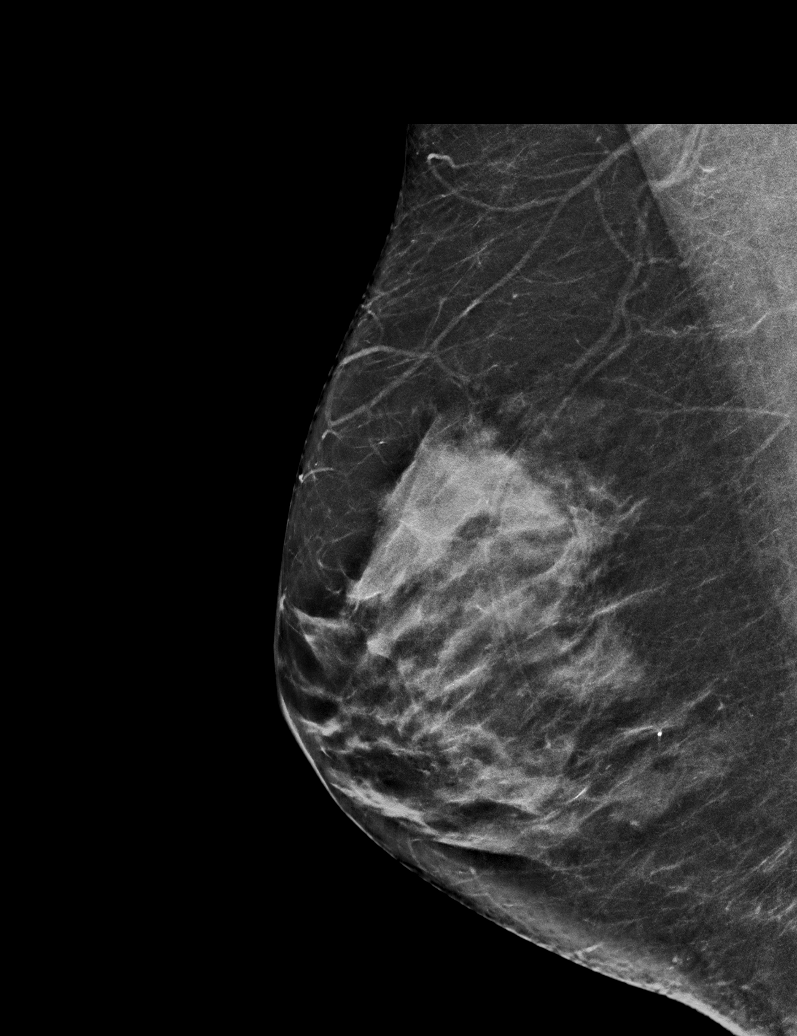

[R CC synth-2D (1 of 2)]
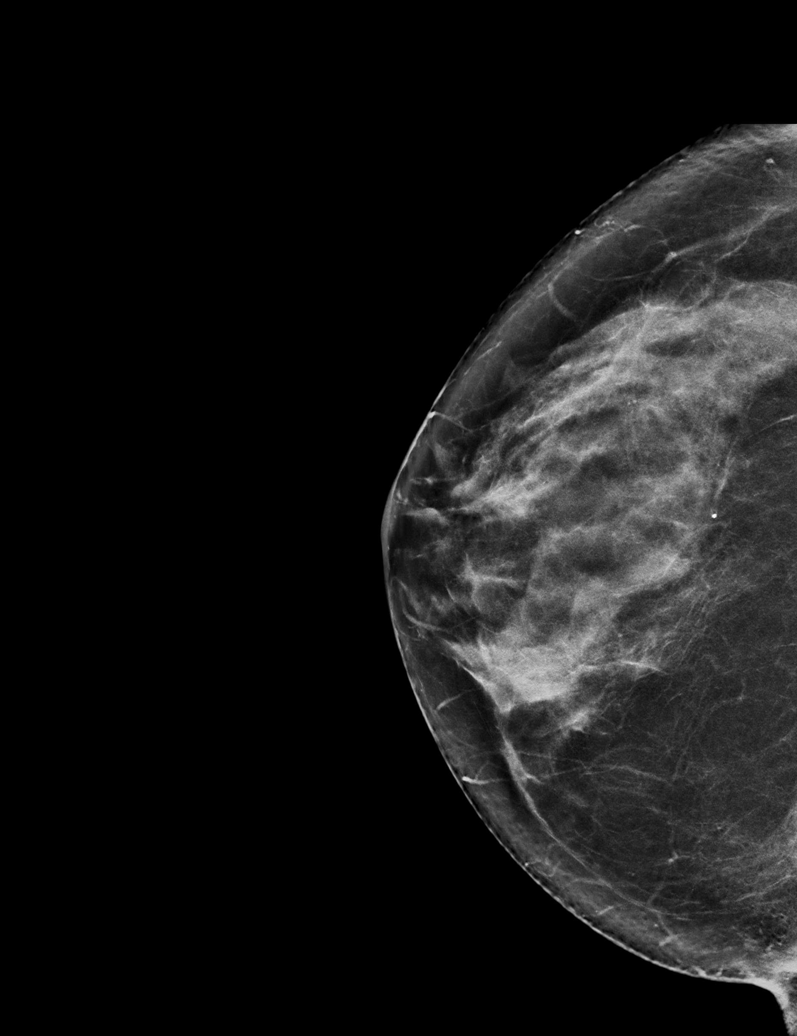

[L MLO synth-2D]
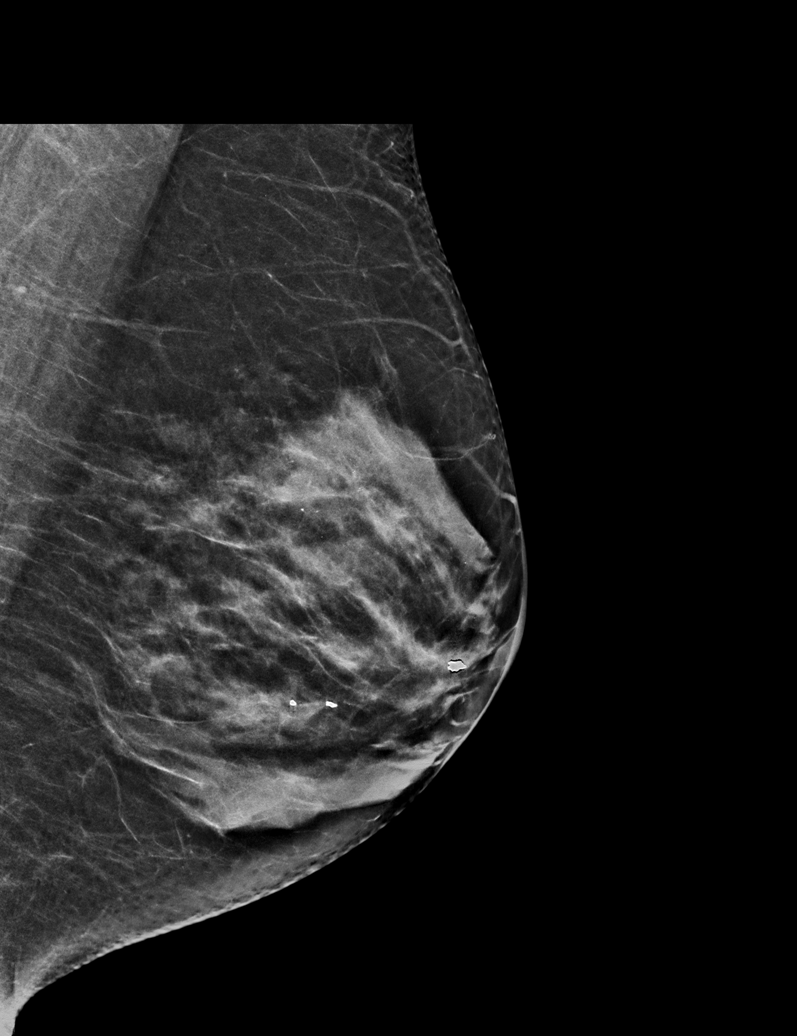

[L CC synth-2D]
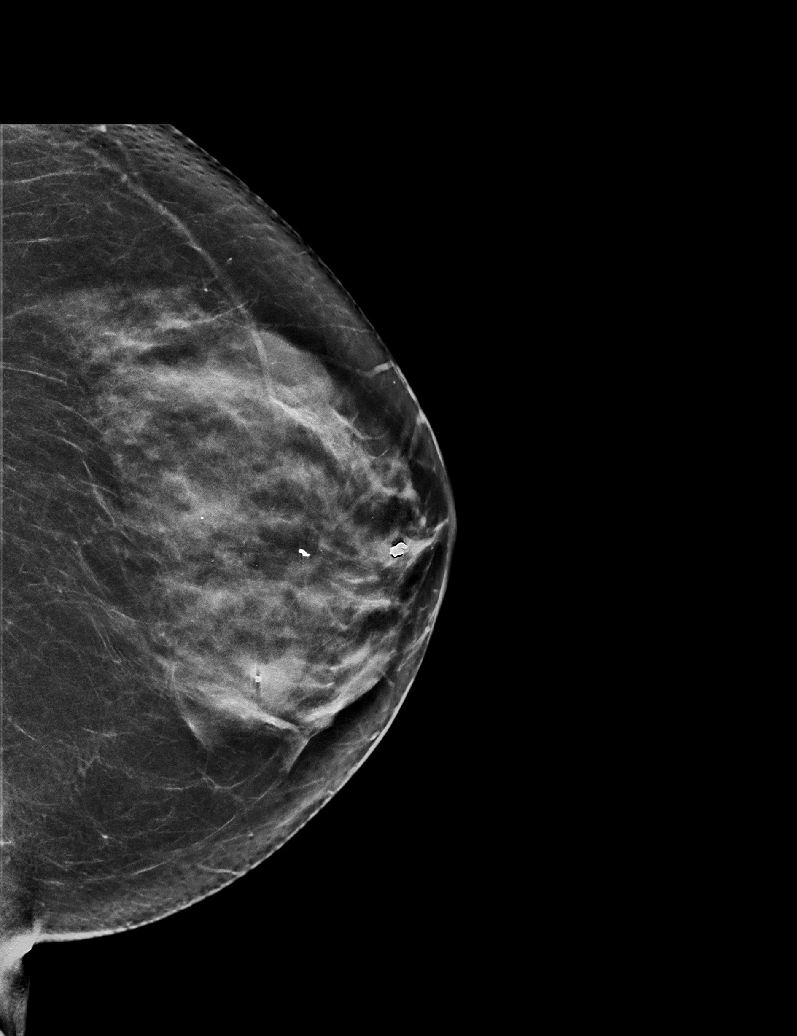

[R CC synth-2D (2 of 2)]
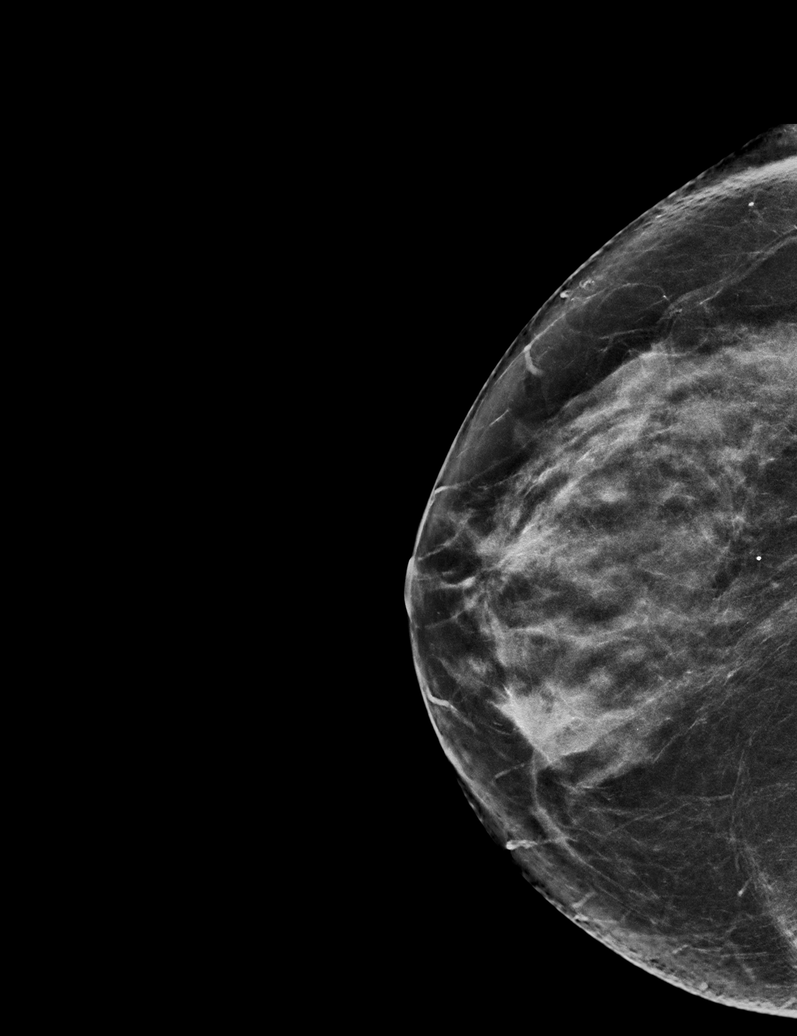

[R MLO tomo · tomo slice 35/68.0]
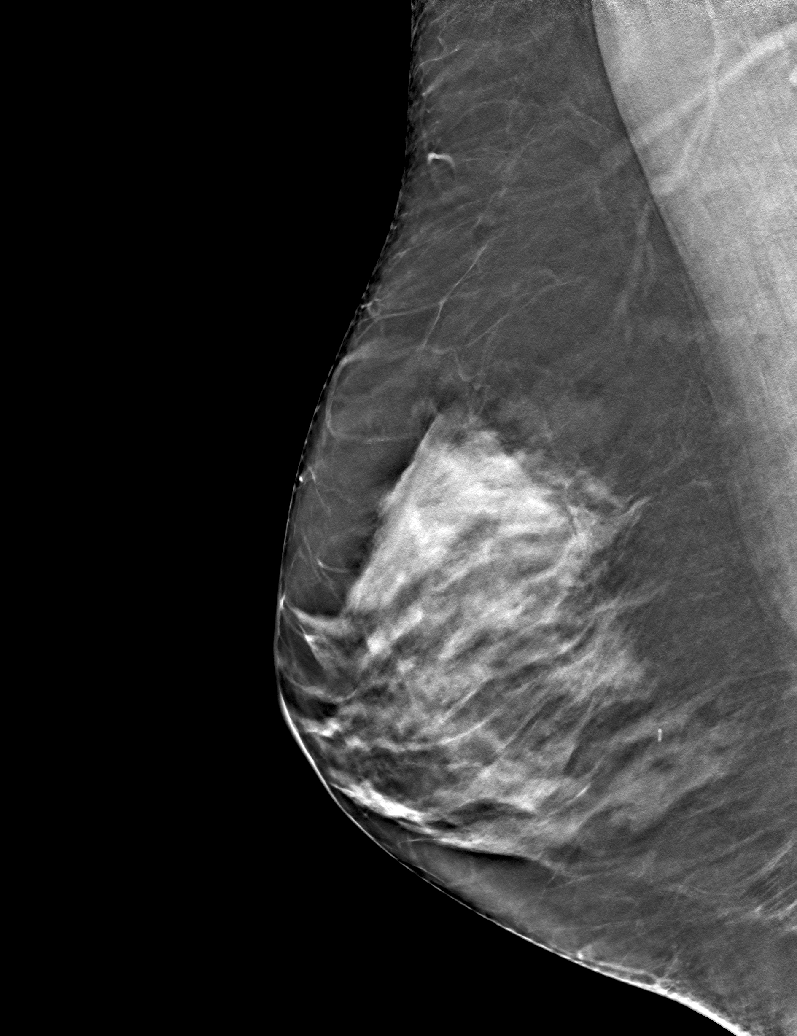

[6 of 30 positions shown; findings below may reference images not displayed]

ACR Breast Density Category c: The breast tissue is heterogeneously
dense, which may obscure small masses.
FINDINGS: There are no findings suspicious for malignancy. Images were
processed with CAD.
IMPRESSION: No mammographic evidence of malignancy. A result letter of this
screening mammogram will be mailed directly to the patient.

RECOMMENDATION:
Screening mammogram in one year. (Code:[id number]-LHB)

BI-RADS CATEGORY  1: Negative.

## 2019-07-26 ENCOUNTER — Other Ambulatory Visit: Payer: Self-pay

## 2019-07-26 ENCOUNTER — Ambulatory Visit (INDEPENDENT_AMBULATORY_CARE_PROVIDER_SITE_OTHER): Payer: Medicare Other

## 2019-07-26 DIAGNOSIS — Z23 Encounter for immunization: Secondary | ICD-10-CM

## 2019-08-30 ENCOUNTER — Encounter: Payer: Self-pay | Admitting: Anesthesiology

## 2019-09-01 ENCOUNTER — Other Ambulatory Visit
Admission: RE | Admit: 2019-09-01 | Discharge: 2019-09-01 | Disposition: A | Discharge status: Home / Self Care | Payer: Medicare Other | Source: Ambulatory Visit | Attending: Ophthalmology | Admitting: Ophthalmology

## 2019-09-01 ENCOUNTER — Other Ambulatory Visit: Payer: Self-pay

## 2019-09-01 DIAGNOSIS — Z20828 Contact with and (suspected) exposure to other viral communicable diseases: Secondary | ICD-10-CM | POA: Diagnosis not present

## 2019-09-01 DIAGNOSIS — Z01812 Encounter for preprocedural laboratory examination: Secondary | ICD-10-CM | POA: Diagnosis present

## 2019-09-02 LAB — SARS CORONAVIRUS 2 (TAT 6-24 HRS): SARS Coronavirus 2: NEGATIVE

## 2019-09-05 ENCOUNTER — Encounter
Admission: RE | Disposition: A | Discharge status: Home / Self Care | Payer: Self-pay | Source: Home / Self Care | Attending: Ophthalmology

## 2019-09-05 SURGERY — PHACOEMULSIFICATION, CATARACT, WITH IOL INSERTION
Anesthesia: Topical | Laterality: Left

## 2019-09-05 NOTE — Discharge Instructions (Signed)

## 2019-09-13 ENCOUNTER — Ambulatory Visit: Payer: Medicare Other

## 2019-09-13 ENCOUNTER — Encounter: Payer: Medicare Other | Admitting: Physician Assistant

## 2019-09-21 ENCOUNTER — Ambulatory Visit (INDEPENDENT_AMBULATORY_CARE_PROVIDER_SITE_OTHER): Payer: Medicare Other

## 2019-09-21 ENCOUNTER — Other Ambulatory Visit: Payer: Self-pay

## 2019-09-21 DIAGNOSIS — Z0000 Encounter for general adult medical examination without abnormal findings: Secondary | ICD-10-CM | POA: Diagnosis not present

## 2019-09-21 NOTE — Progress Notes (Signed)
Subjective:   Allison [name] is a 68 y.o. female who presents for Medicare Annual (Subsequent) preventive examination.    This visit is being conducted through telemedicine due to the COVID-19 pandemic. This patient has given me verbal consent via doximity to conduct this visit, patient states they are participating from their home address. Some vital signs may be absent or patient reported.    Patient identification: identified by name, DOB, and current address  Review of Systems:  N/A  Cardiac Risk Factors include: advanced age (>80men, >64 women);sedentary lifestyle     Objective:     Vitals: There were no vitals taken for this visit.  There is no height or weight on file to calculate BMI. Unable to obtain vitals due to visit being conducted via telephonically.   Advanced Directives 09/21/2019 10/11/2018 08/17/2018 11/25/2017 08/23/2017 07/06/2017 08/14/2016  Does Patient Have a Medical Advance Directive? Yes Yes Yes Yes Yes No Yes  Type of Paramedic of [place];Living will [place];Living will [place];Living will [place];Living will;Out of facility DNR (pink MOST or yellow form) [place];Living will - Living will;Healthcare Power of Attorney  Does patient want to make changes to medical advance directive? - - - No - Patient declined - - -  Copy of [place] in Chart? No - copy requested - No - copy requested - - - -  Would patient like information on creating a medical advance directive? - - - - - No - Patient declined -    Tobacco Social History   Tobacco Use  Smoking Status Never Smoker  Smokeless Tobacco Never Used     Counseling given: Not Answered   Clinical Intake:  Pre-visit preparation completed: Yes  Pain : No/denies pain Pain Score: 0-No pain     Nutritional Risks: None Diabetes: No  How often do you need to have someone help you  when you read instructions, pamphlets, or other written materials from your doctor or pharmacy?: 1 - Never  Interpreter Needed?: No  Information entered by :: [facility], LPN  Past Medical History:  Diagnosis Date  . Medical history non-contributory   . Rosacea    Past Surgical History:  Procedure Laterality Date  . BREAST BIOPSY Left 2002   benign  . CATARACT EXTRACTION W/PHACO Right 07/06/2017   Procedure: CATARACT EXTRACTION PHACO AND INTRAOCULAR LENS PLACEMENT (IOC);  Surgeon: Birder Robson, MD;  Location: [facility] ORS;  Service: Ophthalmology;  Laterality: Right;  [place] 00:34.2 AP% 12.7 CDE 4.33 Fluid Pack lot # [id number]  . COLONOSCOPY WITH PROPOFOL N/A 10/11/2018   Procedure: COLONOSCOPY WITH PROPOFOL;  Surgeon: Lucilla Lame, MD;  Location: [facility] ENDOSCOPY;  Service: Endoscopy;  Laterality: N/A;  . NO PAST SURGERIES     Family History  Problem Relation Age of Onset  . Bladder Cancer Mother   . Breast cancer Mother   . Stroke Father   . Hyperlipidemia Father   . Breast cancer Sister 96  . Kidney Stones Brother   . Hyperlipidemia Brother   . Kidney Stones Brother   . Diverticulosis Brother    Social History   Socioeconomic History  . Marital status: Married    Spouse name: Not on file  . Number of children: 0  . Years of education: Not on file  . Highest education level: Master's degree (e.g., MA, MS, MEng, MEd, MSW, MBA)  Occupational History  . Occupation: Armed forces operational officer  Social Needs  .  Financial resource strain: Not hard at all  . Food insecurity    Worry: Never true    Inability: Never true  . Transportation needs    Medical: No    Non-medical: No  Tobacco Use  . Smoking status: Never Smoker  . Smokeless tobacco: Never Used  Substance and Sexual Activity  . Alcohol use: Yes    Alcohol/week: 14.0 standard drinks    Types: 14 Glasses of wine per week  . Drug use: No  . Sexual activity: Not on file  Lifestyle  . Physical activity    Days per week: 0  days    Minutes per session: 0 min  . Stress: Not at all  Relationships  . Social Herbalist on phone: Patient refused    Gets together: Patient refused    Attends religious service: Patient refused    Active member of club or organization: Patient refused    Attends meetings of clubs or organizations: Patient refused    Relationship status: Patient refused  Other Topics Concern  . Not on file  Social History Narrative  . Not on file    Outpatient Encounter Medications as of 09/21/2019  Medication Sig  . Calcium Carb-Cholecalciferol (CALCIUM + D3) 600-200 MG-UNIT TABS Take 2 tablets by mouth daily.   . ferrous sulfate 325 (65 FE) MG tablet Take 325 mg by mouth daily with breakfast.  . mometasone (ELOCON) 0.1 % lotion INSTILL 4 DROPS IN BOTH EAR CANALS EVERY NIGHT AT BEDTIME AS NEEDED FOR DRY SKIN AND ITCHING  . RHOFADE 1 % CREA APPLY TO FACE QAM  . doxycycline (VIBRA-TABS) 100 MG tablet Take 1 tablet (100 mg total) by mouth 2 (two) times daily. (Patient not taking: Reported on 10/11/2018)  . FINACEA 15 % FOAM APPLY A SMALL AMOUNT ON THE SKIN DAILY  . furosemide (LASIX) 20 MG tablet Take 1 tablet (20 mg total) by mouth daily as needed. (Patient not taking: Reported on 08/17/2018)  . meloxicam (MOBIC) 15 MG tablet Take 1 tablet (15 mg total) daily by mouth. (Patient not taking: Reported on 08/17/2018)  . potassium chloride (K-DUR) 10 MEQ tablet Take 1 tablet (10 mEq total) by mouth daily as needed. Anytime she takes furosemide take potassium (Patient not taking: Reported on 08/17/2018)   No facility-administered encounter medications on file as of 09/21/2019.     Activities of Daily Living In your present state of health, do you have any difficulty performing the following activities: 09/21/2019  Hearing? N  Vision? N  Comment Has a cataract on the left eye.  Difficulty concentrating or making decisions? N  Walking or climbing stairs? N  Dressing or bathing? N  Doing  errands, shopping? N  Preparing Food and eating ? N  Using the Toilet? N  In the past six months, have you accidently leaked urine? Y  Comment occasionally with pressure  Do you have problems with loss of bowel control? N  Managing your Medications? N  Managing your Finances? N  Housekeeping or managing your Housekeeping? N  Some recent data might be hidden    Patient Care Team: Mar Daring, PA-C as PCP - General (Family Medicine) Carloyn Manner, MD as Referring Physician (Otolaryngology) Birder Robson, MD as Referring Physician (Ophthalmology) Jannet Mantis, MD (Dermatology)    Assessment:   This is a routine wellness examination for Makalya.  Exercise Activities and Dietary recommendations Current Exercise Habits: Home exercise routine, Type of exercise: walking, Time (Minutes): 20, Frequency (  Times/Week): 5, Weekly Exercise (Minutes/Week): 100, Intensity: Mild, Exercise limited by: None identified  Goals    . DIET - INCREASE WATER INTAKE     Recommend increasing water intake to 6-8 8 oz glasses a day.     . Exercise 150 minutes per week (moderate activity)    . Exercise 3x per week (30 min per time)     Recommend to start walking 3 days a week for at least 30 minutes at a time.        Fall Risk: Fall Risk  09/21/2019 09/21/2019 08/17/2018 08/23/2017 06/03/2017  Falls in the past year? 0 0 No Yes No  Number falls in past yr: 0 0 - - -  Injury with Fall? 0 0 - - -    FALL RISK PREVENTION PERTAINING TO THE HOME:  Any stairs in or around the home? Yes  If so, are there any without handrails? No   Home free of loose throw rugs in walkways, pet beds, electrical cords, etc? Yes  Adequate lighting in your home to reduce risk of falls? Yes   ASSISTIVE DEVICES UTILIZED TO PREVENT FALLS:  Life alert? No  Use of a cane, walker or w/c? No  Grab bars in the bathroom? No  Shower chair or bench in shower? No  Elevated toilet seat or a handicapped toilet?  Yes    TIMED UP AND GO:  Was the test performed? No .    Depression Screen PHQ 2/9 Scores 09/21/2019 08/17/2018 08/23/2017 08/14/2015  PHQ - 2 Score 0 0 0 0     Cognitive Function     6CIT Screen 09/21/2019 08/17/2018  What Year? 0 points 0 points  What month? 0 points 0 points  What time? 0 points 0 points  Count back from 20 0 points 0 points  Months in reverse 0 points 0 points  Repeat phrase 2 points 2 points  Total Score 2 2    Immunization History  Administered Date(s) Administered  . Fluad Quad(high Dose 65+) 07/26/2019  . Influenza, High Dose Seasonal PF 08/23/2017, 08/17/2018  . Influenza,inj,Quad PF,6+ Mos 08/08/2014, 08/14/2015, 08/14/2016  . Pneumococcal Conjugate-13 08/23/2017  . Pneumococcal Polysaccharide-23 08/08/2014  . Td 02/14/2005  . Zoster 12/18/2011  . Zoster Recombinat (Shingrix) 08/23/2017, 11/05/2017    Qualifies for Shingles Vaccine? Completed series  Tdap: Although this vaccine is not a covered service during a Wellness Exam, does the patient still wish to receive this vaccine today?  No .   Flu Vaccine: Up to date  Pneumococcal Vaccine: Due for Pneumococcal vaccine. Does the patient want to receive this vaccine today?  No .   Screening Tests Health Maintenance  Topic Date Due  . PNA vac Low Risk Adult (2 of 2 - PPSV23) 08/09/2019  . TETANUS/TDAP  09/20/2020 (Originally 02/15/2015)  . DEXA SCAN  09/28/2019  . MAMMOGRAM  10/07/2020  . COLONOSCOPY  10/11/2028  . INFLUENZA VACCINE  Completed  . Hepatitis C Screening  Completed    Cancer Scr.eenings:  Colorectal Screening: Completed 10/11/18. Repeat every 10 years.  Mammogram: Completed 10/07/18.   Bone Density: Completed 09/27/17. Results reflect OSTEOPOROSIS.   Lung Cancer Screening: (Low Dose CT Chest recommended if Age 66-80 years, 30 pack-year currently smoking OR have quit w/in 15years.) does not qualify.   Additional Screening:  Hepatitis C Screening: Up to date  Vision  Screening: Recommended annual ophthalmology exams for early detection of glaucoma and other disorders of the eye.  Dental Screening: Recommended annual dental  exams for proper oral hygiene  Community Resource Referral:  CRR required this visit?  No       Plan:  I have personally reviewed and addressed the Medicare Annual Wellness questionnaire and have noted the following in the patient's chart:  A. Medical and social history B. Use of alcohol, tobacco or illicit drugs  C. Current medications and supplements D. Functional ability and status E.  Nutritional status F.  Physical activity G. Advance directives H. List of other physicians I.  Hospitalizations, surgeries, and ER visits in previous 12 months J.  [place] such as hearing and vision if needed, cognitive and depression L. Referrals and appointments   In addition, I have reviewed and discussed with patient certain preventive protocols, quality metrics, and best practice recommendations. A written personalized care plan for preventive services as well as general preventive health recommendations were provided to patient. Nurse Health Advisor  Signed,    Edgerrin Correia [place], [place]  [id number] Nurse Health Advisor   Nurse Notes: Pt would like to receive the Pneumovax 23 at her next in office apt.

## 2019-09-21 NOTE — Patient Instructions (Signed)
[name] , Thank you for taking time to come for your Medicare Wellness Visit. I appreciate your ongoing commitment to your health goals. Please review the following plan we discussed and let me know if I can assist you in the future.   Screening recommendations/referrals: Colonoscopy: Up to date, due 09/2028 Mammogram: Up to date, due 09/2020 Bone Density: Up to date, due 09/2019 Recommended yearly ophthalmology/optometry visit for glaucoma screening and checkup Recommended yearly dental visit for hygiene and checkup  Vaccinations: Influenza vaccine: Up to date Pneumococcal vaccine: Pneumovax 23 due Tdap vaccine: Pt declines today.  Shingles vaccine: Completed series    Advanced directives: Please bring a copy of your POA (Power of Attorney) and/or Living Will to your next appointment.   Conditions/risks identified: Recommend to increase water intake to 6-8 8 oz glasses daily.  Next appointment: 09/29/19 @ 2:00 PM with Allison [name].   Preventive Care 24 Years and Older, Female Preventive care refers to lifestyle choices and visits with your health care provider that can promote health and wellness. What does preventive care include?  A yearly physical exam. This is also called an annual well check.  Dental exams once or twice a year.  Routine eye exams. Ask your health care provider how often you should have your eyes checked.  Personal lifestyle choices, including:  Daily care of your teeth and gums.  Regular physical activity.  Eating a healthy diet.  Avoiding tobacco and drug use.  Limiting alcohol use.  Practicing safe sex.  Taking low-dose aspirin every day.  Taking vitamin and mineral supplements as recommended by your health care provider. What happens during an annual well check? The services and screenings done by your health care provider during your annual well check will depend on your age, overall health, lifestyle risk factors, and family  history of disease. Counseling  Your health care provider may ask you questions about your:  Alcohol use.  Tobacco use.  Drug use.  Emotional well-being.  Home and relationship well-being.  Sexual activity.  Eating habits.  History of falls.  Memory and ability to understand (cognition).  Work and work Statistician.  Reproductive health. Screening  You may have the following tests or measurements:  Height, weight, and BMI.  Blood pressure.  Lipid and cholesterol levels. These may be checked every 5 years, or more frequently if you are over 20 years old.  Skin check.  Lung cancer screening. You may have this screening every year starting at age 28 if you have a 30-pack-year history of smoking and currently smoke or have quit within the past 15 years.  Fecal occult blood test (FOBT) of the stool. You may have this test every year starting at age 76.  Flexible sigmoidoscopy or colonoscopy. You may have a sigmoidoscopy every 5 years or a colonoscopy every 10 years starting at age 26.  Hepatitis C blood test.  Hepatitis B blood test.  Sexually transmitted disease (STD) testing.  Diabetes screening. This is done by checking your blood sugar (glucose) after you have not eaten for a while (fasting). You may have this done every 1-3 years.  Bone density scan. This is done to screen for osteoporosis. You may have this done starting at age 55.  Mammogram. This may be done every 1-2 years. Talk to your health care provider about how often you should have regular mammograms. Talk with your health care provider about your test results, treatment options, and if necessary, the need for more tests. Vaccines  Your health care provider may recommend certain vaccines, such as:  Influenza vaccine. This is recommended every year.  Tetanus, diphtheria, and acellular pertussis (Tdap, Td) vaccine. You may need a Td booster every 10 years.  Zoster vaccine. You may need this after  age 81.  Pneumococcal 13-valent conjugate (PCV13) vaccine. One dose is recommended after age 17.  Pneumococcal polysaccharide (PPSV23) vaccine. One dose is recommended after age 77. Talk to your health care provider about which screenings and vaccines you need and how often you need them. This information is not intended to replace advice given to you by your health care provider. Make sure you discuss any questions you have with your health care provider. Document Released: 11/29/2015 Document Revised: 07/22/2016 Document Reviewed: 09/03/2015 Elsevier Interactive Patient Education  2017 [place] Prevention in the Home Falls can cause injuries. They can happen to people of all ages. There are many things you can do to make your home safe and to help prevent falls. What can I do on the outside of my home?  Regularly fix the edges of walkways and driveways and fix any cracks.  Remove anything that might make you trip as you walk through a door, such as a raised step or threshold.  Trim any bushes or trees on the path to your home.  Use bright outdoor lighting.  Clear any walking paths of anything that might make someone trip, such as rocks or tools.  Regularly check to see if handrails are loose or broken. Make sure that both sides of any steps have handrails.  Any raised decks and porches should have guardrails on the edges.  Have any leaves, snow, or ice cleared regularly.  Use sand or salt on walking paths during winter.  Clean up any spills in your garage right away. This includes oil or grease spills. What can I do in the bathroom?  Use night lights.  Install grab bars by the toilet and in the tub and shower. Do not use towel bars as grab bars.  Use non-skid mats or decals in the tub or shower.  If you need to sit down in the shower, use a plastic, non-slip stool.  Keep the floor dry. Clean up any water that spills on the floor as soon as it happens.   Remove soap buildup in the tub or shower regularly.  Attach bath mats securely with double-sided non-slip rug tape.  Do not have throw rugs and other things on the floor that can make you trip. What can I do in the bedroom?  Use night lights.  Make sure that you have a light by your bed that is easy to reach.  Do not use any sheets or blankets that are too big for your bed. They should not hang down onto the floor.  Have a firm chair that has side arms. You can use this for support while you get dressed.  Do not have throw rugs and other things on the floor that can make you trip. What can I do in the kitchen?  Clean up any spills right away.  Avoid walking on wet floors.  Keep items that you use a lot in easy-to-reach places.  If you need to reach something above you, use a strong step stool that has a grab bar.  Keep electrical cords out of the way.  Do not use floor polish or wax that makes floors slippery. If you must use wax, use non-skid floor wax.  Do  not have throw rugs and other things on the floor that can make you trip. What can I do with my stairs?  Do not leave any items on the stairs.  Make sure that there are handrails on both sides of the stairs and use them. Fix handrails that are broken or loose. Make sure that handrails are as long as the stairways.  Check any carpeting to make sure that it is firmly attached to the stairs. Fix any carpet that is loose or worn.  Avoid having throw rugs at the top or bottom of the stairs. If you do have throw rugs, attach them to the floor with carpet tape.  Make sure that you have a light switch at the top of the stairs and the bottom of the stairs. If you do not have them, ask someone to add them for you. What else can I do to help prevent falls?  Wear shoes that:  Do not have high heels.  Have rubber bottoms.  Are comfortable and fit you well.  Are closed at the toe. Do not wear sandals.  If you use a  stepladder:  Make sure that it is fully opened. Do not climb a closed stepladder.  Make sure that both sides of the stepladder are locked into place.  Ask someone to hold it for you, if possible.  Clearly mark and make sure that you can see:  Any grab bars or handrails.  First and last steps.  Where the edge of each step is.  Use tools that help you move around (mobility aids) if they are needed. These include:  Canes.  Walkers.  Scooters.  Crutches.  Turn on the lights when you go into a dark area. Replace any light bulbs as soon as they burn out.  Set up your furniture so you have a clear path. Avoid moving your furniture around.  If any of your floors are uneven, fix them.  If there are any pets around you, be aware of where they are.  Review your medicines with your doctor. Some medicines can make you feel dizzy. This can increase your chance of falling. Ask your doctor what other things that you can do to help prevent falls. This information is not intended to replace advice given to you by your health care provider. Make sure you discuss any questions you have with your health care provider. Document Released: 08/29/2009 Document Revised: 04/09/2016 Document Reviewed: 12/07/2014 Elsevier Interactive Patient Education  2017 [employer].

## 2019-09-25 ENCOUNTER — Other Ambulatory Visit: Payer: Self-pay

## 2019-09-25 ENCOUNTER — Encounter: Payer: Self-pay | Admitting: *Deleted

## 2019-09-28 NOTE — Progress Notes (Signed)
Patient: Allison [name], Female    DOB: 06/08/1951, 68 y.o.   MRN: [id number] Visit Date: 09/29/2019  Today's Provider: Mar Daring, PA-C   Chief Complaint  Patient presents with   Annual Exam   Subjective:    I,Allison [name],RMA am acting as a Education administrator for [employer], PA-C.  Patient had [facility] with [facility] 09/21/2019   [name] is a 68 y.o. female. She feels well. She reports exercising. She reports she is sleeping well. ----------------------------------------------------------- 10/07/2018 -Normal mammogram. Repeat screening in one year.  09/27/2017 -BMD shows osteopenia. Continue Vit D 800 IU daily minimum and calcium 1200mg  daily minimum and weight bearing exercises. Will recheck in 2-3 years.  10/11/2018-Colonoscopy-Repeat colonoscopy in 5 years if polyp adenoma and 10 years if hyperplastic Pathology Report:NEGATIVE FOR DYSPLASIA AND MALIGNANCY   Review of Systems  Constitutional: Negative.   HENT: Negative.   Eyes: Negative.   Respiratory: Negative.   Cardiovascular: Negative.   Gastrointestinal: Negative.   Endocrine: Negative.   Genitourinary: Negative.   Musculoskeletal: Negative.   Skin: Negative.   Allergic/Immunologic: Negative.   Neurological: Negative.   Hematological: Negative.   Psychiatric/Behavioral: Negative.     Social History   Socioeconomic History   Marital status: Married    Spouse name: Not on file   Number of children: 0   Years of education: Not on file   Highest education level: Master's degree (e.g., MA, MS, MEng, MEd, MSW, MBA)  Occupational History   Occupation: business Education officer, museum strain: Not hard at all   Food insecurity    Worry: Never true    Inability: Never true   Transportation needs    Medical: No    Non-medical: No  Tobacco Use   Smoking status: Never Smoker   Smokeless tobacco: Never Used  Substance and Sexual Activity    Alcohol use: Yes    Alcohol/week: 14.0 standard drinks    Types: 14 Glasses of wine per week   Drug use: No   Sexual activity: Not on file  Lifestyle   Physical activity    Days per week: 0 days    Minutes per session: 0 min   Stress: Not at all  Relationships   Social connections    Talks on phone: Patient refused    Gets together: Patient refused    Attends religious service: Patient refused    Active member of club or organization: Patient refused    Attends meetings of clubs or organizations: Patient refused    Relationship status: Patient refused   Intimate partner violence    Fear of current or ex partner: Patient refused    Emotionally abused: Patient refused    Physically abused: Patient refused    Forced sexual activity: Patient refused  Other Topics Concern   Not on file  Social History Narrative   Not on file    Past Medical History:  Diagnosis Date   Medical history non-contributory    Rosacea      Patient Active Problem List   Diagnosis Date Noted   Special screening for malignant neoplasms, colon    Polyp of sigmoid colon    Blood in the urine 08/13/2015   H/O hyperthyroidism 08/13/2015   H/O amblyopia 08/13/2015    Past Surgical History:  Procedure Laterality Date   BREAST BIOPSY Left 2002   benign   CATARACT EXTRACTION W/PHACO Right 07/06/2017  Procedure: CATARACT EXTRACTION PHACO AND INTRAOCULAR LENS PLACEMENT (IOC);  Surgeon: Birder Robson, MD;  Location: [facility] ORS;  Service: Ophthalmology;  Laterality: Right;  [place] 00:34.2 AP% 12.7 CDE 4.33 Fluid Pack lot # [id number]   COLONOSCOPY WITH PROPOFOL N/A 10/11/2018   Procedure: COLONOSCOPY WITH PROPOFOL;  Surgeon: Lucilla Lame, MD;  Location: [facility] ENDOSCOPY;  Service: Endoscopy;  Laterality: N/A;    Her family history includes Bladder Cancer in her mother; Breast cancer in her mother; Breast cancer (age of onset: 47) in her sister; Diverticulosis in her brother; Hyperlipidemia in  her brother and father; Kidney Stones in her brother and brother; Stroke in her father.   Current Outpatient Medications:    Calcium Carb-Cholecalciferol (CALCIUM + D3) 600-200 MG-UNIT TABS, Take 2 tablets by mouth daily. , Disp: , Rfl:    ferrous sulfate 325 (65 FE) MG tablet, Take 325 mg by mouth daily with breakfast., Disp: , Rfl:    mometasone (ELOCON) 0.1 % lotion, INSTILL 4 DROPS IN BOTH EAR CANALS EVERY NIGHT AT BEDTIME AS NEEDED FOR DRY SKIN AND ITCHING, Disp: , Rfl: 12   RHOFADE 1 % CREA, APPLY TO FACE QAM, Disp: , Rfl: 11  Patient Care Team: Mar Daring, PA-C as PCP - General (Family Medicine) Carloyn Manner, MD as Referring Physician (Otolaryngology) Birder Robson, MD as Referring Physician (Ophthalmology) Jannet Mantis, MD (Dermatology)     Objective:    Vitals: BP 131/81 (BP Location: Left Arm, Patient Position: Sitting, Cuff Size: Large)    Pulse 71    Temp (!) 97.3 F (36.3 C) (Temporal)    Resp 16    Ht 5\' 9"  (1.753 m)    Wt 184 lb (83.5 kg)    BMI 27.17 kg/m   Physical Exam Vitals signs reviewed.  Constitutional:      General: She is not in acute distress.    Appearance: Normal appearance. She is well-developed and normal weight. She is not ill-appearing or diaphoretic.  HENT:     Head: Normocephalic and atraumatic.     Right Ear: Tympanic membrane, ear canal and external ear normal.     Left Ear: Tympanic membrane, ear canal and external ear normal.     Nose: Nose normal.     Mouth/Throat:     Mouth: Mucous membranes are moist.     Pharynx: Oropharynx is clear. No oropharyngeal exudate.  Eyes:     General: No scleral icterus.       Right eye: No discharge.        Left eye: No discharge.     Extraocular Movements: Extraocular movements intact.     Conjunctiva/sclera: Conjunctivae normal.     Pupils: Pupils are equal, round, and reactive to light.  Neck:     Musculoskeletal: Normal range of motion and neck supple.     Thyroid: No  thyromegaly.     Vascular: No carotid bruit or JVD.     Trachea: No tracheal deviation.  Cardiovascular:     Rate and Rhythm: Normal rate and regular rhythm.     Pulses: Normal pulses.     Heart sounds: Normal heart sounds. No murmur. No friction rub. No gallop.   Pulmonary:     Effort: Pulmonary effort is normal. No respiratory distress.     Breath sounds: Normal breath sounds. No wheezing or rales.  Chest:     Chest wall: No tenderness.  Abdominal:     General: Abdomen is flat. Bowel sounds are normal. There is no  distension.     Palpations: Abdomen is soft. There is no mass.     Tenderness: There is no abdominal tenderness. There is no guarding or rebound.  Musculoskeletal: Normal range of motion.        General: No tenderness.     Right lower leg: No edema.     Left lower leg: No edema.  Lymphadenopathy:     Cervical: No cervical adenopathy.  Skin:    General: Skin is warm and dry.     Capillary Refill: Capillary refill takes less than 2 seconds.     Findings: No rash.  Neurological:     General: No focal deficit present.     Mental Status: She is alert and oriented to person, place, and time. Mental status is at baseline.  Psychiatric:        Mood and Affect: Mood normal.        Behavior: Behavior normal.        Thought Content: Thought content normal.        Judgment: Judgment normal.     Activities of Daily Living In your present state of health, do you have any difficulty performing the following activities: 09/21/2019  Hearing? N  Vision? N  Comment Has a cataract on the left eye.  Difficulty concentrating or making decisions? N  Walking or climbing stairs? N  Dressing or bathing? N  Doing errands, shopping? N  Preparing Food and eating ? N  Using the Toilet? N  In the past six months, have you accidently leaked urine? Y  Comment occasionally with pressure  Do you have problems with loss of bowel control? N  Managing your Medications? N  Managing your  Finances? N  Housekeeping or managing your Housekeeping? N  Some recent data might be hidden    Fall Risk Assessment Fall Risk  09/21/2019 09/21/2019 08/17/2018 08/23/2017 06/03/2017  Falls in the past year? 0 0 No Yes No  Number falls in past yr: 0 0 - - -  Injury with Fall? 0 0 - - -     Depression Screen PHQ 2/9 Scores 09/21/2019 08/17/2018 08/23/2017 08/14/2015  PHQ - 2 Score 0 0 0 0    6CIT Screen 09/21/2019  What Year? 0 points  What month? 0 points  What time? 0 points  Count back from 20 0 points  Months in reverse 0 points  Repeat phrase 2 points  Total Score 2       Assessment & Plan:    Annual Physical Reviewed patient's Family Medical History Reviewed and updated list of patient's medical providers Assessment of cognitive impairment was done Assessed patient's functional ability Established a written schedule for health screening [place] Completed and Reviewed  Exercise Activities and Dietary recommendations Goals     DIET - INCREASE WATER INTAKE     Recommend increasing water intake to 6-8 8 oz glasses a day.      Exercise 150 minutes per week (moderate activity)     Exercise 3x per week (30 min per time)     Recommend to start walking 3 days a week for at least 30 minutes at a time.        Immunization History  Administered Date(s) Administered   Fluad Quad(high Dose 65+) 07/26/2019   Influenza, High Dose Seasonal PF 08/23/2017, 08/17/2018   Influenza,inj,Quad PF,6+ Mos 08/08/2014, 08/14/2015, 08/14/2016   Pneumococcal Conjugate-13 08/23/2017   Pneumococcal Polysaccharide-23 08/08/2014   Td 02/14/2005   Zoster 12/18/2011  Zoster Recombinat (Shingrix) 08/23/2017, 11/05/2017    Health Maintenance  Topic Date Due   PNA vac Low Risk Adult (2 of 2 - PPSV23) 08/09/2019   DEXA SCAN  09/28/2019   TETANUS/TDAP  09/20/2020 (Originally 02/15/2015)   MAMMOGRAM  10/07/2020   COLONOSCOPY  10/11/2028   INFLUENZA VACCINE   Completed   Hepatitis C Screening  Completed     Discussed health benefits of physical activity, and encouraged her to engage in regular exercise appropriate for her age and condition.    1. Annual physical exam Normal physical exam today. Will check labs as below and f/u pending lab results. If labs are stable and WNL she will not need to have these rechecked for one year at her next annual physical exam. She is to call the office in the meantime if she has any acute issue, questions or concerns. - CBC w/Diff - Comprehensive Metabolic Panel (CMET) - HgB A1c - Lipid Profile - TSH  2. Encounter for screening mammogram for malignant neoplasm of breast Breast exam today was normal. There is family history of breast cancer in her mother. She does perform regular self breast exams. Mammogram was ordered as below. Information for [facility] Breast clinic was given to patient so she may schedule her mammogram at her convenience. - MM 3D SCREEN BREAST BILATERAL; Future  3. H/O hyperthyroidism Asymptomatic. Will check labs as below and f/u pending results. - TSH  4. Need for pneumococcal vaccination Pneumococcal 23 Vaccine given to patient without complications. Patient sat for 15 minutes after administration and was tolerated well without adverse effects. - Pneumococcal polysaccharide vaccine 23-valent greater than or equal to 2yo subcutaneous/IM  5. Osteopenia, unspecified location Due for repeat BMD. Ordered as below.  - DG Bone Density; Future  6. Postmenopausal estrogen deficiency See above medical treatment plan. - DG Bone Density; Future  7. Hypercholesterolemia Stable and diet controlled. Will check labs as below and f/u pending results. - HgB A1c - Lipid Profile  ------------------------------------------------------------------------------------------------------------    Mar Daring, PA-C  [facility]

## 2019-09-29 ENCOUNTER — Other Ambulatory Visit
Admission: RE | Admit: 2019-09-29 | Discharge: 2019-09-29 | Disposition: A | Discharge status: Home / Self Care | Payer: Medicare Other | Source: Ambulatory Visit | Attending: Ophthalmology | Admitting: Ophthalmology

## 2019-09-29 ENCOUNTER — Encounter: Payer: Self-pay | Admitting: Physician Assistant

## 2019-09-29 ENCOUNTER — Ambulatory Visit (INDEPENDENT_AMBULATORY_CARE_PROVIDER_SITE_OTHER): Payer: Medicare Other | Admitting: Physician Assistant

## 2019-09-29 ENCOUNTER — Other Ambulatory Visit: Payer: Self-pay

## 2019-09-29 VITALS — BP 131/81 | HR 71 | Temp 97.3°F | Resp 16 | Ht 69.0 in | Wt 184.0 lb

## 2019-09-29 DIAGNOSIS — Z01812 Encounter for preprocedural laboratory examination: Secondary | ICD-10-CM | POA: Insufficient documentation

## 2019-09-29 DIAGNOSIS — Z0000 Encounter for general adult medical examination without abnormal findings: Secondary | ICD-10-CM

## 2019-09-29 DIAGNOSIS — Z780 Asymptomatic menopausal state: Secondary | ICD-10-CM

## 2019-09-29 DIAGNOSIS — E7800 Pure hypercholesterolemia, unspecified: Secondary | ICD-10-CM

## 2019-09-29 DIAGNOSIS — Z8639 Personal history of other endocrine, nutritional and metabolic disease: Secondary | ICD-10-CM

## 2019-09-29 DIAGNOSIS — Z20828 Contact with and (suspected) exposure to other viral communicable diseases: Secondary | ICD-10-CM | POA: Diagnosis not present

## 2019-09-29 DIAGNOSIS — Z1231 Encounter for screening mammogram for malignant neoplasm of breast: Secondary | ICD-10-CM

## 2019-09-29 DIAGNOSIS — Z23 Encounter for immunization: Secondary | ICD-10-CM

## 2019-09-29 DIAGNOSIS — M8580 Other specified disorders of bone density and structure, unspecified site: Secondary | ICD-10-CM

## 2019-09-29 NOTE — Patient Instructions (Signed)
Health Maintenance After Age 67 After age 67, you are at a higher risk for certain long-term diseases and infections as well as injuries from falls. Falls are a major cause of broken bones and head injuries in people who are older than age 67. Getting regular preventive care can help to keep you healthy and well. Preventive care includes getting regular testing and making lifestyle changes as recommended by your health care provider. Talk with your health care provider about:  Which screenings and tests you should have. A screening is a test that checks for a disease when you have no symptoms.  A diet and exercise plan that is right for you. What should I know about screenings and tests to prevent falls? Screening and testing are the best ways to find a health problem early. Early diagnosis and treatment give you the best chance of managing medical conditions that are common after age 67. Certain conditions and lifestyle choices may make you more likely to have a fall. Your health care provider may recommend:  Regular vision checks. Poor vision and conditions such as cataracts can make you more likely to have a fall. If you wear glasses, make sure to get your prescription updated if your vision changes.  Medicine review. Work with your health care provider to regularly review all of the medicines you are taking, including over-the-counter medicines. Ask your health care provider about any side effects that may make you more likely to have a fall. Tell your health care provider if any medicines that you take make you feel dizzy or sleepy.  Osteoporosis screening. Osteoporosis is a condition that causes the bones to get weaker. This can make the bones weak and cause them to break more easily.  Blood pressure screening. Blood pressure changes and medicines to control blood pressure can make you feel dizzy.  Strength and balance checks. Your health care provider may recommend certain tests to check your  strength and balance while standing, walking, or changing positions.  Foot health exam. Foot pain and numbness, as well as not wearing proper footwear, can make you more likely to have a fall.  Depression screening. You may be more likely to have a fall if you have a fear of falling, feel emotionally low, or feel unable to do activities that you used to do.  Alcohol use screening. Using too much alcohol can affect your balance and may make you more likely to have a fall. What actions can I take to lower my risk of falls? General instructions  Talk with your health care provider about your risks for falling. Tell your health care provider if: ? You fall. Be sure to tell your health care provider about all falls, even ones that seem minor. ? You feel dizzy, sleepy, or off-balance.  Take over-the-counter and prescription medicines only as told by your health care provider. These include any supplements.  Eat a healthy diet and maintain a healthy weight. A healthy diet includes low-fat dairy products, low-fat (lean) meats, and fiber from whole grains, beans, and lots of fruits and vegetables. Home safety  Remove any tripping hazards, such as rugs, cords, and clutter.  Install safety equipment such as grab bars in bathrooms and safety rails on stairs.  Keep rooms and walkways well-lit. Activity   Follow a regular exercise program to stay fit. This will help you maintain your balance. Ask your health care provider what types of exercise are appropriate for you.  If you need a cane or   walker, use it as recommended by your health care provider.  Wear supportive shoes that have nonskid soles. Lifestyle  Do not drink alcohol if your health care provider tells you not to drink.  If you drink alcohol, limit how much you have: ? 0-1 drink a day for women. ? 0-2 drinks a day for men.  Be aware of how much alcohol is in your drink. In the [place] one drink equals one typical bottle of beer (12  oz), one-half glass of wine (5 oz), or one shot of hard liquor (1 oz).  Do not use any products that contain nicotine or tobacco, such as cigarettes and e-cigarettes. If you need help quitting, ask your health care provider. Summary  Having a healthy lifestyle and getting preventive care can help to protect your health and wellness after age 67.  Screening and testing are the best way to find a health problem early and help you avoid having a fall. Early diagnosis and treatment give you the best chance for managing medical conditions that are more common for people who are older than age 67.  Falls are a major cause of broken bones and head injuries in people who are older than age 67. Take precautions to prevent a fall at home.  Work with your health care provider to learn what changes you can make to improve your health and wellness and to prevent falls. This information is not intended to replace advice given to you by your health care provider. Make sure you discuss any questions you have with your health care provider. Document Released: 09/15/2017 Document Revised: 02/23/2019 Document Reviewed: 09/15/2017 Elsevier Patient Education  2020 [employer]  

## 2019-09-30 LAB — CBC WITH DIFFERENTIAL/PLATELET
Basophils Absolute: 0.1 10*3/uL (ref 0.0–0.2)
Basos: 1 %
EOS (ABSOLUTE): 0.1 10*3/uL (ref 0.0–0.4)
Eos: 2 %
Hematocrit: 38.9 % (ref 34.0–46.6)
Hemoglobin: 13.2 g/dL (ref 11.1–15.9)
Immature Grans (Abs): 0 10*3/uL (ref 0.0–0.1)
Immature Granulocytes: 0 %
Lymphocytes Absolute: 1.3 10*3/uL (ref 0.7–3.1)
Lymphs: 27 %
MCH: 31.7 pg (ref 26.6–33.0)
MCHC: 33.9 g/dL (ref 31.5–35.7)
MCV: 94 fL (ref 79–97)
Monocytes Absolute: 0.4 10*3/uL (ref 0.1–0.9)
Monocytes: 9 %
Neutrophils Absolute: 2.8 10*3/uL (ref 1.4–7.0)
Neutrophils: 61 %
Platelets: 325 10*3/uL (ref 150–450)
RBC: 4.16 x10E6/uL (ref 3.77–5.28)
RDW: 12.1 % (ref 11.7–15.4)
WBC: 4.6 10*3/uL (ref 3.4–10.8)

## 2019-09-30 LAB — TSH: TSH: 2.29 u[IU]/mL (ref 0.450–4.500)

## 2019-09-30 LAB — COMPREHENSIVE METABOLIC PANEL
ALT: 13 IU/L (ref 0–32)
AST: 21 IU/L (ref 0–40)
Albumin/Globulin Ratio: 2.2 (ref 1.2–2.2)
Albumin: 4.8 g/dL (ref 3.8–4.8)
Alkaline Phosphatase: 60 IU/L (ref 39–117)
BUN/Creatinine Ratio: 17 (ref 12–28)
BUN: 12 mg/dL (ref 8–27)
Bilirubin Total: 0.5 mg/dL (ref 0.0–1.2)
CO2: 23 mmol/L (ref 20–29)
Calcium: 9.7 mg/dL (ref 8.7–10.3)
Chloride: 99 mmol/L (ref 96–106)
Creatinine, Ser: 0.69 mg/dL (ref 0.57–1.00)
GFR calc Af Amer: 104 mL/min/{1.73_m2} (ref >59)
GFR calc non Af Amer: 90 mL/min/{1.73_m2} (ref >59)
Globulin, Total: 2.2 g/dL (ref 1.5–4.5)
Glucose: 86 mg/dL (ref 65–99)
Potassium: 4.3 mmol/L (ref 3.5–5.2)
Sodium: 137 mmol/L (ref 134–144)
Total Protein: 7 g/dL (ref 6.0–8.5)

## 2019-09-30 LAB — LIPID PANEL
Chol/HDL Ratio: 2.2 ratio (ref 0.0–4.4)
Cholesterol, Total: 221 mg/dL — ABNORMAL HIGH (ref 100–199)
HDL: 99 mg/dL (ref >39)
LDL Chol Calc (NIH): 111 mg/dL — ABNORMAL HIGH (ref 0–99)
Triglycerides: 64 mg/dL (ref 0–149)
VLDL Cholesterol Cal: 11 mg/dL (ref 5–40)

## 2019-09-30 LAB — SARS CORONAVIRUS 2 (TAT 6-24 HRS): SARS Coronavirus 2: NEGATIVE

## 2019-09-30 LAB — HEMOGLOBIN A1C
Est. average glucose Bld gHb Est-mCnc: 108 mg/dL
Hgb A1c MFr Bld: 5.4 % (ref 4.8–5.6)

## 2019-10-02 ENCOUNTER — Telehealth: Payer: Self-pay

## 2019-10-02 NOTE — Anesthesia Preprocedure Evaluation (Addendum)
Anesthesia Evaluation  Patient identified by MRN, date of birth, ID band Patient awake    Reviewed: Allergy & Precautions, NPO status , Patient's Chart, lab work & pertinent test results  History of Anesthesia Complications Negative for: history of anesthetic complications  Airway Mallampati: III   Neck ROM: Full    Dental no notable dental hx.    Pulmonary neg pulmonary ROS,    Pulmonary exam normal breath sounds clear to auscultation       Cardiovascular Exercise Tolerance: Good negative cardio ROS Normal cardiovascular exam Rhythm:Regular Rate:Normal     Neuro/Psych negative neurological ROS     GI/Hepatic negative GI ROS,   Endo/Other  negative endocrine ROS  Renal/GU negative Renal ROS     Musculoskeletal   Abdominal   Peds  Hematology negative hematology ROS (+)   Anesthesia Other Findings   Reproductive/Obstetrics                             Anesthesia Physical Anesthesia Plan  ASA: I  Anesthesia Plan: MAC   Post-op Pain Management:    Induction: Intravenous  PONV Risk Score and Plan: 2 and TIVA, Midazolam and Treatment may vary due to age or medical condition  Airway Management Planned: Natural Airway  Additional Equipment:   Intra-op Plan:   Post-operative Plan:   Informed Consent: I have reviewed the patients History and Physical, chart, labs and discussed the procedure including the risks, benefits and alternatives for the proposed anesthesia with the patient or authorized representative who has indicated his/her understanding and acceptance.       Plan Discussed with: CRNA  Anesthesia Plan Comments:        Anesthesia Quick Evaluation

## 2019-10-02 NOTE — Telephone Encounter (Signed)
LMTCB 10/02/2019  Thanks,   -Mickel Baas

## 2019-10-02 NOTE — Telephone Encounter (Signed)
-----   Message from Mar Daring, [place] sent at 10/02/2019  9:02 AM EST ----- Blood count is normal. Kidney and liver function are normal. Sodium, potassium and calcium are normal. Sugar/A1c is normal. Cholesterol increased slightly compared to last year. Work on healthy lifestyle modifications with dieting and exercise. Limiting fatty foods, processed foods, red meats and sugars can help. Thyroid is normal.

## 2019-10-03 ENCOUNTER — Encounter: Payer: Self-pay | Admitting: Anesthesiology

## 2019-10-03 ENCOUNTER — Other Ambulatory Visit: Payer: Self-pay

## 2019-10-03 ENCOUNTER — Encounter
Admission: RE | Disposition: A | Discharge status: Home / Self Care | Payer: Self-pay | Source: Home / Self Care | Attending: Ophthalmology

## 2019-10-03 ENCOUNTER — Ambulatory Visit
Admission: RE | Admit: 2019-10-03 | Discharge: 2019-10-03 | Disposition: A | Discharge status: Home / Self Care | Payer: Medicare Other | Attending: Ophthalmology | Admitting: Ophthalmology

## 2019-10-03 DIAGNOSIS — Z9849 Cataract extraction status, unspecified eye: Secondary | ICD-10-CM | POA: Insufficient documentation

## 2019-10-03 DIAGNOSIS — H2512 Age-related nuclear cataract, left eye: Secondary | ICD-10-CM | POA: Diagnosis not present

## 2019-10-03 DIAGNOSIS — Z881 Allergy status to other antibiotic agents status: Secondary | ICD-10-CM | POA: Insufficient documentation

## 2019-10-03 DIAGNOSIS — L719 Rosacea, unspecified: Secondary | ICD-10-CM | POA: Insufficient documentation

## 2019-10-03 HISTORY — PX: CATARACT EXTRACTION W/PHACO: SHX586

## 2019-10-03 SURGERY — PHACOEMULSIFICATION, CATARACT, WITH IOL INSERTION
Anesthesia: Monitor Anesthesia Care | Site: Eye | Laterality: Left

## 2019-10-03 MED ORDER — LIDOCAINE HCL (PF) 2 % IJ SOLN
INTRAOCULAR | Status: DC | PRN
  Administered 2019-10-03: 1 mL

## 2019-10-03 MED ORDER — ACETAMINOPHEN 325 MG PO TABS: 325.0000 mg | ORAL_TABLET | Freq: Once | ORAL | Status: DC

## 2019-10-03 MED ORDER — BRIMONIDINE TARTRATE-TIMOLOL 0.2-0.5 % OP SOLN
OPHTHALMIC | Status: DC | PRN
  Administered 2019-10-03: 1 [drp] via OPHTHALMIC

## 2019-10-03 MED ORDER — MIDAZOLAM HCL 2 MG/2ML IJ SOLN
INTRAMUSCULAR | Status: DC | PRN
  Administered 2019-10-03: 2 mg via INTRAVENOUS

## 2019-10-03 MED ORDER — ARMC OPHTHALMIC DILATING DROPS
1.0000 "application " | OPHTHALMIC | Status: DC | PRN
  Administered 2019-10-03 (×3): 1 via OPHTHALMIC

## 2019-10-03 MED ORDER — NA CHONDROIT SULF-NA HYALURON 40-17 MG/ML IO SOLN
INTRAOCULAR | Status: DC | PRN
  Administered 2019-10-03: 1 mL via INTRAOCULAR

## 2019-10-03 MED ORDER — TETRACAINE HCL 0.5 % OP SOLN
1.0000 [drp] | OPHTHALMIC | Status: DC | PRN
  Administered 2019-10-03 (×3): 1 [drp] via OPHTHALMIC

## 2019-10-03 MED ORDER — LACTATED RINGERS IV SOLN: INTRAVENOUS | Status: DC

## 2019-10-03 MED ORDER — FENTANYL CITRATE (PF) 100 MCG/2ML IJ SOLN
INTRAMUSCULAR | Status: DC | PRN
  Administered 2019-10-03: 50 ug via INTRAVENOUS

## 2019-10-03 MED ORDER — ACETAMINOPHEN 160 MG/5ML PO SOLN: 325.0000 mg | Freq: Once | ORAL | Status: DC

## 2019-10-03 MED ORDER — EPINEPHRINE PF 1 MG/ML IJ SOLN
INTRAOCULAR | Status: DC | PRN
  Administered 2019-10-03: 67 mL via OPHTHALMIC

## 2019-10-03 MED ORDER — MOXIFLOXACIN HCL 0.5 % OP SOLN
OPHTHALMIC | Status: DC | PRN
  Administered 2019-10-03: 0.2 mL via OPHTHALMIC

## 2019-10-03 SURGICAL SUPPLY — 20 items
CANNULA ANT/CHMB 27GA (MISCELLANEOUS) ×4 IMPLANT
GLOVE SURG LX 8.0 MICRO (GLOVE) ×2
GLOVE SURG LX STRL 8.0 MICRO (GLOVE) ×2 IMPLANT
GLOVE SURG TRIUMPH 8.0 PF LTX (GLOVE) ×2 IMPLANT
GOWN STRL REUS W/ TWL LRG LVL3 (GOWN DISPOSABLE) ×2 IMPLANT
GOWN STRL REUS W/TWL LRG LVL3 (GOWN DISPOSABLE) ×2
LENS IOL TECNIS ITEC 18.0 (Intraocular Lens) ×2 IMPLANT
MARKER SKIN DUAL TIP RULER LAB (MISCELLANEOUS) ×2 IMPLANT
NDL RETROBULBAR .5 NSTRL (NEEDLE) ×2 IMPLANT
NEEDLE FILTER BLUNT 18X 1/2SAF (NEEDLE) ×1
NEEDLE FILTER BLUNT 18X1 1/2 (NEEDLE) ×1 IMPLANT
PACK EYE AFTER SURG (MISCELLANEOUS) ×2 IMPLANT
PACK OPTHALMIC (MISCELLANEOUS) ×2 IMPLANT
PACK PORFILIO (MISCELLANEOUS) ×2 IMPLANT
SUT ETHILON 10-0 CS-B-6CS-B-6 (SUTURE)
SUTURE EHLN 10-0 CS-B-6CS-B-6 (SUTURE) IMPLANT
SYR 3ML LL SCALE MARK (SYRINGE) ×2 IMPLANT
SYR TB 1ML LUER SLIP (SYRINGE) ×2 IMPLANT
WATER STERILE IRR 250ML POUR (IV SOLUTION) ×2 IMPLANT
WIPE NON LINTING 3.25X3.25 (MISCELLANEOUS) ×2 IMPLANT

## 2019-10-03 NOTE — Transfer of Care (Signed)
Immediate Anesthesia Transfer of Care Note  Patient: Allison [name]  Procedure(s) Performed: CATARACT EXTRACTION PHACO AND INTRAOCULAR LENS PLACEMENT ([facility]) LEFT (Left Eye)  Patient Location: PACU  Anesthesia Type: MAC  Level of Consciousness: awake, alert  and patient cooperative  Airway and Oxygen Therapy: Patient Spontanous Breathing and Patient connected to supplemental oxygen  Post-op Assessment: Post-op Vital signs reviewed, Patient's Cardiovascular Status Stable, Respiratory Function Stable, Patent Airway and No signs of Nausea or vomiting  Post-op Vital Signs: Reviewed and stable  Complications: No apparent anesthesia complications

## 2019-10-03 NOTE — Anesthesia Postprocedure Evaluation (Signed)
Anesthesia Post Note  Patient: Allison [name]  Procedure(s) Performed: CATARACT EXTRACTION PHACO AND INTRAOCULAR LENS PLACEMENT ([place]) LEFT (Left Eye)     Patient location during evaluation: PACU Anesthesia Type: MAC Level of consciousness: awake and alert, oriented and patient cooperative Pain management: pain level controlled Vital Signs Assessment: post-procedure vital signs reviewed and stable Respiratory status: spontaneous breathing, nonlabored ventilation and respiratory function stable Cardiovascular status: blood pressure returned to baseline and stable Postop Assessment: adequate PO intake Anesthetic complications: no    Darrin Nipper

## 2019-10-03 NOTE — Op Note (Addendum)
PREOPERATIVE DIAGNOSIS:  Nuclear sclerotic cataract of the left eye.   POSTOPERATIVE DIAGNOSIS:  Cataract   OPERATIVE PROCEDURE:@   SURGEON:  Birder Robson, MD.   ANESTHESIA:  Anesthesiologist: Darrin Nipper, MD CRNA: Silvana Newness, CRNA  1.      Managed anesthesia care. 2.      0.81ml of Shugarcaine was instilled in the eye following the paracentesis.   COMPLICATIONS:  None.   TECHNIQUE:   Stop and chop   DESCRIPTION OF PROCEDURE:  The patient was examined and consented in the preoperative holding area where the aforementioned topical anesthesia was applied to the left eye and then brought back to the Operating Room where the right eye was prepped and draped in the usual sterile ophthalmic fashion and a lid speculum was placed. A paracentesis was created with the side port blade and the anterior chamber was filled with viscoelastic. A near clear corneal incision was performed with the steel keratome. A continuous curvilinear capsulorrhexis was performed with a cystotome followed by the capsulorrhexis forceps. Hydrodissection and hydrodelineation were carried out with BSS on a blunt cannula. The lens was removed in a stop and chop  technique and the remaining cortical material was removed with the irrigation-aspiration handpiece. The capsular bag was inflated with viscoelastic and the Technis [id number]  lens was placed in the capsular bag without complication. The remaining viscoelastic was removed from the eye with the irrigation-aspiration handpiece. The wounds were hydrated. The anterior chamber was flushed with BSS and the eye was inflated to physiologic pressure. 0.22ml of Vigamox was placed in the anterior chamber. The wounds were found to be water tight. The eye was dressed with Combigan. The patient was given protective glasses to wear throughout the day and a shield with which to sleep tonight. The patient was also given drops with which to begin a drop regimen today and will follow-up  with me in one day. Implant Name Type Inv. Item Serial No. Manufacturer Lot No. LRB No. Used Action  LENS IOL DIOP 18.0 - [id number] Intraocular Lens LENS IOL DIOP 18.0 [id number]  Left 1 Implanted   Procedure(s) with comments: CATARACT EXTRACTION PHACO AND INTRAOCULAR LENS PLACEMENT ([facility]) LEFT (Left) - CDE 6.44 [place] 0:47.3  Electronically signed: Birder Robson 10/03/2019 8:24 AM

## 2019-10-03 NOTE — H&P (Signed)
All labs reviewed. Abnormal studies sent to patients PCP when indicated.  Previous H&P reviewed, patient examined, there are NO CHANGES.  Rupal Childress Porfilio11/17/20207:57 AM

## 2019-10-04 ENCOUNTER — Encounter: Payer: Self-pay | Admitting: Ophthalmology

## 2019-10-23 ENCOUNTER — Inpatient Hospital Stay: Admission: RE | Admit: 2019-10-23 | Payer: Medicare Other | Source: Ambulatory Visit

## 2019-10-23 ENCOUNTER — Ambulatory Visit: Payer: Medicare Other

## 2019-10-25 ENCOUNTER — Ambulatory Visit: Payer: Medicare Other

## 2019-10-30 ENCOUNTER — Ambulatory Visit: Payer: Medicare Other

## 2019-11-07 ENCOUNTER — Telehealth: Payer: Self-pay

## 2019-11-07 ENCOUNTER — Ambulatory Visit
Admission: RE | Admit: 2019-11-07 | Discharge: 2019-11-07 | Disposition: A | Discharge status: Home / Self Care | Payer: Medicare Other | Source: Ambulatory Visit | Attending: Physician Assistant | Admitting: Physician Assistant

## 2019-11-07 ENCOUNTER — Other Ambulatory Visit: Payer: Self-pay

## 2019-11-07 DIAGNOSIS — M8580 Other specified disorders of bone density and structure, unspecified site: Secondary | ICD-10-CM | POA: Insufficient documentation

## 2019-11-07 DIAGNOSIS — Z780 Asymptomatic menopausal state: Secondary | ICD-10-CM | POA: Insufficient documentation

## 2019-11-07 DIAGNOSIS — Z1231 Encounter for screening mammogram for malignant neoplasm of breast: Secondary | ICD-10-CM | POA: Diagnosis present

## 2019-11-07 NOTE — Telephone Encounter (Signed)
-----   Message from Mar Daring, [place] sent at 11/07/2019  5:13 PM EST ----- Bone density declined just slightly over the last two years from a T score of -1.4 to -1.7 currently. This is still showing osteopenia. Continue Calcium +Vit D supplementation and weight bearing exercises such as walking and yoga.

## 2019-11-07 NOTE — Telephone Encounter (Signed)
Patient advised as directed below. 

## 2020-04-12 ENCOUNTER — Encounter: Payer: Self-pay | Admitting: Physician Assistant

## 2020-04-12 ENCOUNTER — Telehealth (INDEPENDENT_AMBULATORY_CARE_PROVIDER_SITE_OTHER): Payer: Medicare PPO | Admitting: Physician Assistant

## 2020-04-12 DIAGNOSIS — L989 Disorder of the skin and subcutaneous tissue, unspecified: Secondary | ICD-10-CM | POA: Diagnosis not present

## 2020-04-12 DIAGNOSIS — W57XXXA Bitten or stung by nonvenomous insect and other nonvenomous arthropods, initial encounter: Secondary | ICD-10-CM | POA: Diagnosis not present

## 2020-04-12 MED ORDER — DOXYCYCLINE HYCLATE 100 MG PO TABS: 100.0000 mg | ORAL_TABLET | Freq: Two times a day (BID) | ORAL | 0 refills | Status: DC

## 2020-04-12 NOTE — Progress Notes (Signed)
MyChart Video Visit    Virtual Visit via Video Note   This visit type was conducted due to national recommendations for restrictions regarding the COVID-19 Pandemic (e.g. social distancing) in an effort to limit this patient's exposure and mitigate transmission in our community. This patient is at least at moderate risk for complications without adequate follow up. This format is felt to be most appropriate for this patient at this time. Physical exam was limited by quality of the video and audio technology used for the visit.   Patient location: Home Provider location: [facility]   Patient: Allison [name]   DOB: [birth year]   69 y.o. Female  MRN: [id number] Visit Date: 04/12/2020  Today's healthcare provider: Mar Daring, PA-C   Chief Complaint  Patient presents with  . Insect Bite   Subjective    HPI Over the last couple weeks patient has had multiple tick bites and has had at least 2 she has pulled off of her. One was larger and appears to have been attached longer than 24 hours. Currently only itching over bites.  Patient Active Problem List   Diagnosis Date Noted  . Special screening for malignant neoplasms, colon   . Polyp of sigmoid colon   . Blood in the urine 08/13/2015  . H/O hyperthyroidism 08/13/2015  . H/O amblyopia 08/13/2015   Past Medical History:  Diagnosis Date  . Medical history non-contributory   . Rosacea    Social History   Tobacco Use  . Smoking status: Never Smoker  . Smokeless tobacco: Never Used  Substance Use Topics  . Alcohol use: Yes    Alcohol/week: 14.0 standard drinks    Types: 14 Glasses of wine per week  . Drug use: No   Allergies  Allergen Reactions  . Strawberry Extract     difficulty breathing  . Tetracycline Rash    Medications: Outpatient Medications Prior to Visit  Medication Sig  . Calcium Carb-Cholecalciferol (CALCIUM + D3) 600-200 MG-UNIT TABS Take 2 tablets by mouth daily.   . ferrous sulfate 325 (65 FE) MG  tablet Take 325 mg by mouth daily with breakfast.  . RHOFADE 1 % CREA APPLY TO FACE QAM  . mometasone (ELOCON) 0.1 % lotion INSTILL 4 DROPS IN BOTH EAR CANALS EVERY NIGHT AT BEDTIME AS NEEDED FOR DRY SKIN AND ITCHING   No facility-administered medications prior to visit.    Review of Systems  Constitutional: Negative.   Respiratory: Negative.   Cardiovascular: Negative.   Musculoskeletal: Positive for neck pain. Negative for arthralgias, back pain, gait problem, joint swelling, myalgias and neck stiffness.  Skin: Positive for wound. Negative for color change and rash.  Neurological: Negative for weakness, numbness and headaches.  Hematological: Negative for adenopathy.     Objective    There were no vitals taken for this visit.   Physical Exam Vitals reviewed.  Constitutional:      Appearance: She is well-developed.  HENT:     Head: Normocephalic and atraumatic.  Pulmonary:     Effort: Pulmonary effort is normal. No respiratory distress.  Musculoskeletal:     Cervical back: Normal range of motion and neck supple.  Skin:    Comments: See mychart message from same day with photos attached  Psychiatric:        Behavior: Behavior normal.        Thought Content: Thought content normal.        Judgment: Judgment normal.       Assessment &  Plan     1. Skin lesion Multiple tick bites over last week to two weeks. One attached long enough to fill with blood. Appeared to be a dog tick and possibly lone star tick of the ones she has found on her. Will treat with doxycycline as below. Advised to monitor for muscle aches, joint pains, fevers, or rash. If any occur please call the office as she would need 10 more days of treatment. She voices understanding.  - doxycycline (VIBRA-TABS) 100 MG tablet; Take 1 tablet (100 mg total) by mouth 2 (two) times daily.  Dispense: 20 tablet; Refill: 0  2. Tick bite, initial encounter See above medical treatment plan. - doxycycline (VIBRA-TABS)  100 MG tablet; Take 1 tablet (100 mg total) by mouth 2 (two) times daily.  Dispense: 20 tablet; Refill: 0   No follow-ups on file.     I discussed the assessment and treatment plan with the patient. The patient was provided an opportunity to ask questions and all were answered. The patient agreed with the plan and demonstrated an understanding of the instructions.   The patient was advised to call back or seek an in-person evaluation if the symptoms worsen or if the condition fails to improve as anticipated.  I provided 8 minutes of non-face-to-face time during this encounter.  Reynolds Bowl, PA-C, have reviewed all documentation for this visit. The documentation on 04/12/20 for the exam, diagnosis, procedures, and orders are all accurate and complete.  [place] [facility] [phone] (phone) [phone] (fax)  [place]

## 2020-06-27 ENCOUNTER — Other Ambulatory Visit: Payer: Self-pay

## 2020-06-27 ENCOUNTER — Encounter: Payer: Self-pay | Admitting: Family Medicine

## 2020-06-27 ENCOUNTER — Ambulatory Visit (INDEPENDENT_AMBULATORY_CARE_PROVIDER_SITE_OTHER): Payer: Medicare PPO | Admitting: Family Medicine

## 2020-06-27 VITALS — BP 116/76 | HR 66 | Temp 98.1°F | Resp 16 | Wt 192.2 lb

## 2020-06-27 DIAGNOSIS — W57XXXA Bitten or stung by nonvenomous insect and other nonvenomous arthropods, initial encounter: Secondary | ICD-10-CM

## 2020-06-27 DIAGNOSIS — R21 Rash and other nonspecific skin eruption: Secondary | ICD-10-CM | POA: Diagnosis not present

## 2020-06-27 DIAGNOSIS — R0982 Postnasal drip: Secondary | ICD-10-CM | POA: Diagnosis not present

## 2020-06-27 DIAGNOSIS — J309 Allergic rhinitis, unspecified: Secondary | ICD-10-CM | POA: Diagnosis not present

## 2020-06-27 NOTE — Patient Instructions (Addendum)
Try Claritin or Zyrtec once a day for allergy like symptoms. Apply hydrocortisone cream to areas on skin.  Dizziness Dizziness is a common problem. It is a feeling of unsteadiness or light-headedness. You may feel like you are about to faint. Dizziness can lead to injury if you stumble or fall. Anyone can become dizzy, but dizziness is more common in older adults. This condition can be caused by a number of things, including medicines, dehydration, or illness. Follow these instructions at home: Eating and drinking  Drink enough fluid to keep your urine clear or pale yellow. This helps to keep you from becoming dehydrated. Try to drink more clear fluids, such as water.  Do not drink alcohol.  Limit your caffeine intake if told to do so by your health care provider. Check ingredients and nutrition facts to see if a food or beverage contains caffeine.  Limit your salt (sodium) intake if told to do so by your health care provider. Check ingredients and nutrition facts to see if a food or beverage contains sodium. Activity  Avoid making quick movements. ? Rise slowly from chairs and steady yourself until you feel okay. ? In the morning, first sit up on the side of the bed. When you feel okay, stand slowly while you hold onto something until you know that your balance is fine.  If you need to stand in one place for a long time, move your legs often. Tighten and relax the muscles in your legs while you are standing.  Do not drive or use heavy machinery if you feel dizzy.  Avoid bending down if you feel dizzy. Place items in your home so that they are easy for you to reach without leaning over. Lifestyle  Do not use any products that contain nicotine or tobacco, such as cigarettes and e-cigarettes. If you need help quitting, ask your health care provider.  Try to reduce your stress level by using methods such as yoga or meditation. Talk with your health care provider if you need help to manage  your stress. General instructions  Watch your dizziness for any changes.  Take over-the-counter and prescription medicines only as told by your health care provider. Talk with your health care provider if you think that your dizziness is caused by a medicine that you are taking.  Tell a friend or a family member that you are feeling dizzy. If he or she notices any changes in your behavior, have this person call your health care provider.  Keep all follow-up visits as told by your health care provider. This is important. Contact a health care provider if:  Your dizziness does not go away.  Your dizziness or light-headedness gets worse.  You feel nauseous.  You have reduced hearing.  You have new symptoms.  You are unsteady on your feet or you feel like the room is spinning. Get help right away if:  You vomit or have diarrhea and are unable to eat or drink anything.  You have problems talking, walking, swallowing, or using your arms, hands, or legs.  You feel generally weak.  You are not thinking clearly or you have trouble forming sentences. It may take a friend or family member to notice this.  You have chest pain, abdominal pain, shortness of breath, or sweating.  Your vision changes.  You have any bleeding.  You have a severe headache.  You have neck pain or a stiff neck.  You have a fever. These symptoms may represent a serious  problem that is an emergency. Do not wait to see if the symptoms will go away. Get medical help right away. Call your local emergency services (911 in the [place] Do not drive yourself to the hospital. Summary  Dizziness is a feeling of unsteadiness or light-headedness. This condition can be caused by a number of things, including medicines, dehydration, or illness.  Anyone can become dizzy, but dizziness is more common in older adults.  Drink enough fluid to keep your urine clear or pale yellow. Do not drink alcohol.  Avoid making quick  movements if you feel dizzy. Monitor your dizziness for any changes. This information is not intended to replace advice given to you by your health care provider. Make sure you discuss any questions you have with your health care provider. Document Revised: 11/05/2017 Document Reviewed: 12/05/2016 Elsevier Patient Education  2020 [place], Adult An insect bite can make your skin red, itchy, and swollen. Some insects can spread disease to people with a bite. However, most insect bites do not lead to disease, and most are not serious. What are the causes? Insects may bite for many reasons, including:  Hunger.  To defend themselves. Insects that bite include:  Spiders.  Mosquitoes.  Ticks.  Fleas.  Ants.  Flies.  Kissing bugs.  Chiggers. What are the signs or symptoms? Symptoms of this condition include:  Itching or pain in the bite area.  Redness and swelling in the bite area.  An open wound (skin ulcer). Symptoms often last for 2-4 days. In rare cases, a person may have a very bad allergic reaction (anaphylactic reaction) to a bite. Symptoms of an anaphylactic reaction may include:  Feeling warm in the face (flushed). Your face may turn red.  Itchy, red, swollen areas of skin (hives).  Swelling of the: ? Eyes. ? Lips. ? Face. ? Mouth. ? Tongue. ? Throat.  Trouble with any of these: ? Breathing. ? Talking. ? Swallowing.  Loud breathing (wheezing).  Feeling dizzy or light-headed.  Passing out (fainting).  Pain or cramps in your belly.  Throwing up (vomiting).  Watery poop (diarrhea). How is this treated? Treatment is usually not needed. Symptoms often go away on their own. When treatment is needed, it may involve:  Putting a cream or lotion on the bite area. This helps with itching.  Taking an antibiotic medicine. This treatment is needed if the bite area gets infected.  Getting a tetanus shot, if you are not up to date on  this vaccine.  Putting ice on the affected area.  Using medicines called antihistamines. This treatment may be needed if you have itching or an allergic reaction to the insect bite.  Giving yourself a shot of medicine (epinephrine) using an auto-injector "pen" if you have an anaphylactic reaction to a bite. Your doctor will teach you how to use this pen. Follow these instructions at home: Bite area care   Do not scratch the bite area.  Keep the bite area clean and dry.  Wash the bite area every day with soap and water as told by your doctor.  Check the bite area every day for signs of infection. Check for: ? Redness, swelling, or pain. ? Fluid or blood. ? Warmth. ? Pus or a bad smell. Managing pain, itching, and swelling   You may put any of these on the bite area as told by your doctor: ? A paste made of baking soda and water. ? Cortisone cream. ? Calamine  lotion.  If told, put ice on the bite area. ? Put ice in a plastic bag. ? Place a towel between your skin and the bag. ? Leave the ice on for 20 minutes, 2-3 times a day. General instructions  Apply or take over-the-counter and prescription medicines only as told by your doctor.  If you were prescribed an antibiotic medicine, take or apply it as told by your doctor. Do not stop using the antibiotic even if your condition improves.  Keep all follow-up visits as told by your doctor. This is important. How is this prevented? To help you have a lower risk of insect bites:  When you are outside, wear clothing that covers your arms and legs.  Use insect repellent. The best insect repellents contain one of these: ? DEET. ? Picaridin. ? Oil of lemon eucalyptus (OLE). ? IR3535.  Consider spraying your clothing with a pesticide called permethrin. Permethrin helps prevent insect bites. It works for several weeks and for up to 5-6 clothing washes. Do not apply permethrin directly to the skin.  If your home windows do not  have screens, think about putting some in.  If you will be sleeping in an area where there are mosquitoes, consider covering your sleeping area with a mosquito net. Contact a doctor if:  You have redness, swelling, or pain in the bite area.  You have fluid or blood coming from the bite area.  The bite area feels warm to the touch.  You have pus or a bad smell coming from the bite area.  You have a fever. Get help right away if:  You have joint pain.  You have a rash.  You feel more tired or sleepy than you normally do.  You have neck pain.  You have a headache.  You feel weaker than you normally do.  You have signs of an anaphylactic reaction. Signs may include: ? Feeling warm in the face. ? Itchy, red, swollen areas of skin.  How to Perform the Epley Maneuver The Epley maneuver is an exercise that relieves symptoms of vertigo. Vertigo is the feeling that you or your surroundings are moving when they are not. When you feel vertigo, you may feel like the room is spinning and have trouble walking. Dizziness is a little different than vertigo. When you are dizzy, you may feel unsteady or light-headed. You can do this maneuver at home whenever you have symptoms of vertigo. You can do it up to 3 times a day until your symptoms go away. Even though the Epley maneuver may relieve your vertigo for a few weeks, it is possible that your symptoms will return. This maneuver relieves vertigo, but it does not relieve dizziness. What are the risks? If it is done correctly, the Epley maneuver is considered safe. Sometimes it can lead to dizziness or nausea that goes away after a short time. If you develop other symptoms, such as changes in vision, weakness, or numbness, stop doing the maneuver and call your health care provider. How to perform the Epley maneuver 1. Sit on the edge of a bed or table with your back straight and your legs extended or hanging over the edge of the bed or  table. 2. Turn your head halfway toward the affected ear or side. 3. Lie backward quickly with your head turned until you are lying flat on your back. You may want to position a pillow under your shoulders. 4. Hold this position for 30 seconds. You may experience  an attack of vertigo. This is normal. 5. Turn your head to the opposite direction until your unaffected ear is facing the floor. 6. Hold this position for 30 seconds. You may experience an attack of vertigo. This is normal. Hold this position until the vertigo stops. 7. Turn your whole body to the same side as your head. Hold for another 30 seconds. 8. Sit back up. You can repeat this exercise up to 3 times a day. Follow these instructions at home:  After doing the Epley maneuver, you can return to your normal activities.  Ask your health care provider if there is anything you should do at home to prevent vertigo. He or she may recommend that you: ? Keep your head raised (elevated) with two or more pillows while you sleep. ? Do not sleep on the side of your affected ear. ? Get up slowly from bed. ? Avoid sudden movements during the day. ? Avoid extreme head movement, like looking up or bending over. Contact a health care provider if:  Your vertigo gets worse.  You have other symptoms, including: ? Nausea. ? Vomiting. ? Headache. Get help right away if:  You have vision changes.  You have a severe or worsening headache or neck pain.  You cannot stop vomiting.  You have new numbness or weakness in any part of your body. Summary  Vertigo is the feeling that you or your surroundings are moving when they are not.  The Epley maneuver is an exercise that relieves symptoms of vertigo.  If the Epley maneuver is done correctly, it is considered safe. You can do it up to 3 times a day. This information is not intended to replace advice given to you by your health care provider. Make sure you discuss any questions you have  with your health care provider. Document Revised: 10/15/2017 Document Reviewed: 09/22/2016 Elsevier Patient Education  [place]. How to Perform the Epley Maneuver The Epley maneuver is an exercise that relieves symptoms of vertigo. Vertigo is the feeling that you or your surroundings are moving when they are not. When you feel vertigo, you may feel like the room is spinning and have trouble walking. Dizziness is a little different than vertigo. When you are dizzy, you may feel unsteady or light-headed. You can do this maneuver at home whenever you have symptoms of vertigo. You can do it up to 3 times a day until your symptoms go away. Even though the Epley maneuver may relieve your vertigo for a few weeks, it is possible that your symptoms will return. This maneuver relieves vertigo, but it does not relieve dizziness. What are the risks? If it is done correctly, the Epley maneuver is considered safe. Sometimes it can lead to dizziness or nausea that goes away after a short time. If you develop other symptoms, such as changes in vision, weakness, or numbness, stop doing the maneuver and call your health care provider. How to perform the Epley maneuver 9. Sit on the edge of a bed or table with your back straight and your legs extended or hanging over the edge of the bed or table. 10. Turn your head halfway toward the affected ear or side. 11. Lie backward quickly with your head turned until you are lying flat on your back. You may want to position a pillow under your shoulders. 12. Hold this position for 30 seconds. You may experience an attack of vertigo. This is normal. 13. Turn your head to the opposite direction  until your unaffected ear is facing the floor. 14. Hold this position for 30 seconds. You may experience an attack of vertigo. This is normal. Hold this position until the vertigo stops. 15. Turn your whole body to the same side as your head. Hold for another 30 seconds. 16. Sit  back up. You can repeat this exercise up to 3 times a day. Follow these instructions at home:  After doing the Epley maneuver, you can return to your normal activities.  Ask your health care provider if there is anything you should do at home to prevent vertigo. He or she may recommend that you: ? Keep your head raised (elevated) with two or more pillows while you sleep. ? Do not sleep on the side of your affected ear. ? Get up slowly from bed. ? Avoid sudden movements during the day. ? Avoid extreme head movement, like looking up or bending over. Contact a health care provider if:  Your vertigo gets worse.  You have other symptoms, including: ? Nausea. ? Vomiting. ? Headache. Get help right away if:  You have vision changes.  You have a severe or worsening headache or neck pain.  You cannot stop vomiting.  You have new numbness or weakness in any part of your body. Summary  Vertigo is the feeling that you or your surroundings are moving when they are not.  The Epley maneuver is an exercise that relieves symptoms of vertigo.  If the Epley maneuver is done correctly, it is considered safe. You can do it up to 3 times a day. This information is not intended to replace advice given to you by your health care provider. Make sure you discuss any questions you have with your health care provider. Document Revised: 10/15/2017 Document Reviewed: 09/22/2016 Elsevier Patient Education  [employer]. ? Swelling of your:  Eyes.  Lips.  Face.  Mouth.  Tongue.  Throat. ? Trouble with any of these:  Breathing.  Talking.  Swallowing. ? Loud breathing. ? Feeling dizzy or light-headed. ? Passing out. ? Pain or cramps in your belly. ? Throwing up. ? Watery poop. These symptoms may be an emergency. Do not wait to see if the symptoms will go away. Do this right away:  Use your auto-injector pen as you have been told.  Get medical help. Call your local emergency  services (911 in the [place] Do not drive yourself to the hospital. Summary  An insect bite can make your skin red, itchy, and swollen.  Treatment is usually not needed. Symptoms often go away on their own.  Do not scratch the bite area. Keep it clean and dry.  Ice can help with pain and itching from the bite. This information is not intended to replace advice given to you by your health care provider. Make sure you discuss any questions you have with your health care provider. Document Revised: 05/13/2018 Document Reviewed: 05/13/2018 Elsevier Patient Education  [place].

## 2020-06-27 NOTE — Progress Notes (Signed)
Established patient visit   Patient: Allison [name]   DOB: [birth year]   69 y.o. Female  MRN: [id number] Visit Date: 06/27/2020  Today's healthcare provider: Lavon Paganini, MD  Cameron Ali as a scribe for Lavon Paganini, MD.,have documented all relevant documentation on the behalf of Lavon Paganini, MD,as directed by  Lavon Paganini, MD while in the presence of Lavon Paganini, MD.  Chief Complaint  Patient presents with  . Follow-up   Subjective    HPI  Follow up for tick bite  The patient was last seen for this 3 months ago. Changes made at last visit include patient reported that she had completed 10 day course of Doxycycline.  She reports excellent compliance with treatment. She feels that condition is Worse.  Patient reports that on her her upper abdomen on the left side tick bite appears to still be non healing and patient not has a bulls eye ring around tick bite. Patient also reports multiple tick bites on her back that are red and states that she has concerns because it has been present for 4 weeks now. Patient states that she is unsure how long ticks were on her body. Today patient reports that bite on abdomen is itchy and skin feels itchy on her back as well. Patient states that as of recent she has been having episodes of dizziness and confusion.  She is not having side effects.   She later reports that these bites were not present when she was seen for a tick bite 4 weeks ago and treated with doxycycline.  She mows 5 acres of yard and it is likely that she encountered these at a different time.  She has noticed the spots over the last week or so.   She also reports post nasal drip with sore throat.  No other symptoms of URI, no fever, no wheezing/SOB. -----------------------------------------------------------------------------------------   Patient Active Problem List   Diagnosis Date Noted  . Special screening for malignant  neoplasms, colon   . Polyp of sigmoid colon   . Blood in the urine 08/13/2015  . H/O hyperthyroidism 08/13/2015  . H/O amblyopia 08/13/2015   Past Medical History:  Diagnosis Date  . Medical history non-contributory   . Rosacea    Allergies  Allergen Reactions  . Strawberry Extract     difficulty breathing  . Tetracycline Rash       Medications: Outpatient Medications Prior to Visit  Medication Sig  . Calcium Carb-Cholecalciferol (CALCIUM + D3) 600-200 MG-UNIT TABS Take 2 tablets by mouth daily.   . ferrous sulfate 325 (65 FE) MG tablet Take 325 mg by mouth daily with breakfast.  . mometasone (ELOCON) 0.1 % lotion INSTILL 4 DROPS IN BOTH EAR CANALS EVERY NIGHT AT BEDTIME AS NEEDED FOR DRY SKIN AND ITCHING  . RHOFADE 1 % CREA APPLY TO FACE QAM  . [DISCONTINUED] doxycycline (VIBRA-TABS) 100 MG tablet Take 1 tablet (100 mg total) by mouth 2 (two) times daily.   No facility-administered medications prior to visit.    Review of Systems  Constitutional: Negative.   Respiratory: Negative.   Cardiovascular: Negative.   Gastrointestinal: Negative.   Musculoskeletal: Negative.   Neurological: Negative.   Psychiatric/Behavioral: Negative.     Last CBC Lab Results  Component Value Date   WBC 4.6 09/29/2019   HGB 13.2 09/29/2019   HCT 38.9 09/29/2019   MCV 94 09/29/2019   MCH 31.7 09/29/2019   RDW 12.1 09/29/2019   PLT 325 09/29/2019  Objective    BP 116/76   Pulse 66   Temp 98.1 F (36.7 C) (Oral)   Resp 16   Wt 192 lb 3.2 oz (87.2 kg)   SpO2 98%   BMI 28.38 kg/m     Physical Exam Vitals reviewed.  Constitutional:      General: She is not in acute distress.    Appearance: Normal appearance. She is not diaphoretic.  HENT:     Head: Normocephalic and atraumatic.  Eyes:     General: No scleral icterus.    Conjunctiva/sclera: Conjunctivae normal.  Cardiovascular:     Rate and Rhythm: Normal rate and regular rhythm.  Pulmonary:     Effort: Pulmonary  effort is normal. No respiratory distress.  Skin:    General: Skin is warm and dry.     Comments: Small erythematous papules - 1 in R axilla, 1 on back, 1 on abdominal wall with surrounding ecchymosis   Neurological:     Mental Status: She is alert and oriented to person, place, and time. Mental status is at baseline.  Psychiatric:        Mood and Affect: Mood normal.        Behavior: Behavior normal.      No results found for any visits on 06/27/20.  Assessment & Plan     1. Rash 2. Tick bite, initial encounter -After further discussion, it seems that the spots are not the ones that were present 4 weeks ago when she was treated with doxycycline for tick bite and may been related to new bug bites or tick bites -She does not have any signs or symptoms of [facility] spotted fever or Lyme disease -There is no bull's-eye lesions -The lesion on her abdomen with surrounding ecchymosis is more consistent with a spider bite than a tick bite infection -They all seem to be in various states of healing with no signs of infection -Advised watchful waiting -Discussed strict return precautions and signs of Lyme/RMSF  3. Allergic rhinitis with postnasal drip Intermittent problem -Likely related to mowing the grass and being exposed to her allergens -Advised wearing a mask while mowing to limit allergen exposure -Consider starting antihistamine daily   Return if symptoms worsen or fail to improve.      I, Lavon Paganini, MD, have reviewed all documentation for this visit. The documentation on 06/28/20 for the exam, diagnosis, procedures, and orders are all accurate and complete.   Jameriah Trotti, Dionne Bucy, MD, MPH [facility]

## 2020-08-28 ENCOUNTER — Ambulatory Visit: Payer: Medicare PPO

## 2020-09-18 NOTE — Progress Notes (Signed)
Subjective:   Simone Rodenbeck is a 69 y.o. female who presents for Medicare Annual (Subsequent) preventive examination.  Review of Systems    N/A  Cardiac Risk Factors include: advanced age (>55men, >55 women)     Objective:    Today's Vitals   09/23/20 0955  BP: 137/72  Pulse: 70  Temp: 98.2 F (36.8 C)  TempSrc: Oral  SpO2: 99%  Weight: 193 lb 9.6 oz (87.8 kg)  Height: 5\' 9"  (1.753 m)  PainSc: 0-No pain   Body mass index is 28.59 kg/m.  Advanced Directives 09/23/2020 10/03/2019 09/21/2019 10/11/2018 08/17/2018 11/25/2017 08/23/2017  Does Patient Have a Medical Advance Directive? Yes Yes Yes Yes Yes Yes Yes  Type of Paramedic of [place];Living will [place];Living will [place];Living will [place];Living will [place];Living will [place];Living will;Out of facility DNR (pink MOST or yellow form) [place];Living will  Does patient want to make changes to medical advance directive? - No - Guardian declined - - - No - Patient declined -  Copy of [place] in Chart? No - copy requested No - copy requested No - copy requested - No - copy requested - -  Would patient like information on creating a medical advance directive? - - - - - - -    Current Medications (verified) Outpatient Encounter Medications as of 09/23/2020  Medication Sig  . Calcium Carb-Cholecalciferol (CALCIUM + D3) 600-200 MG-UNIT TABS Take 2 tablets by mouth daily.   . ferrous sulfate 325 (65 FE) MG tablet Take 325 mg by mouth daily with breakfast.  . mometasone (ELOCON) 0.1 % lotion INSTILL 4 DROPS IN BOTH EAR CANALS EVERY NIGHT AT BEDTIME AS NEEDED FOR DRY SKIN AND ITCHING  . RHOFADE 1 % CREA APPLY TO FACE QAM   No facility-administered encounter medications on file as of 09/23/2020.    Allergies (verified) Strawberry extract and Tetracycline     History: Past Medical History:  Diagnosis Date  . Medical history non-contributory   . Rosacea    Past Surgical History:  Procedure Laterality Date  . BREAST BIOPSY Left 2002   benign  . CATARACT EXTRACTION W/PHACO Right 07/06/2017   Procedure: CATARACT EXTRACTION PHACO AND INTRAOCULAR LENS PLACEMENT (IOC);  Surgeon: Birder Robson, MD;  Location: [facility] ORS;  Service: Ophthalmology;  Laterality: Right;  [place] 00:34.2 AP% 12.7 CDE 4.33 Fluid Pack lot # [id number]  . CATARACT EXTRACTION W/PHACO Left 10/03/2019   Procedure: CATARACT EXTRACTION PHACO AND INTRAOCULAR LENS PLACEMENT ([place]) LEFT;  Surgeon: Birder Robson, MD;  Location: [place];  Service: Ophthalmology;  Laterality: Left;  CDE 6.44 [place] 0:47.3  . COLONOSCOPY WITH PROPOFOL N/A 10/11/2018   Procedure: COLONOSCOPY WITH PROPOFOL;  Surgeon: Lucilla Lame, MD;  Location: [facility] ENDOSCOPY;  Service: Endoscopy;  Laterality: N/A;   Family History  Problem Relation Age of Onset  . Bladder Cancer Mother   . Breast cancer Mother   . Stroke Father   . Hyperlipidemia Father   . Breast cancer Sister 25  . Kidney Stones Brother   . Hyperlipidemia Brother   . Kidney Stones Brother   . Diverticulosis Brother    Social History   Socioeconomic History  . Marital status: Married    Spouse name: Not on file  . Number of children: 0  . Years of education: Not on file  . Highest education level: Master's degree (e.g., MA, MS,  MEng, MEd, MSW, [employer])  Occupational History  . Occupation: Armed forces operational officer  Tobacco Use  . Smoking status: Never Smoker  . Smokeless tobacco: Never Used  Vaping Use  . Vaping Use: Never used  Substance and Sexual Activity  . Alcohol use: Yes    Alcohol/week: 14.0 standard drinks    Types: 14 Glasses of wine per week    Comment: 2 a day  . Drug use: No  . Sexual activity: Not on file  Other Topics Concern  . Not on file  Social History Narrative  . Not on file   Social Determinants of Health    Financial Resource Strain: Low Risk   . Difficulty of Paying Living Expenses: Not hard at all  Food Insecurity: No Food Insecurity  . Worried About Charity fundraiser in the Last Year: Never true  . Ran Out of Food in the Last Year: Never true  Transportation Needs: No Transportation Needs  . Lack of Transportation (Medical): No  . Lack of Transportation (Non-Medical): No  Physical Activity: Sufficiently Active  . Days of Exercise per Week: 5 days  . Minutes of Exercise per Session: 60 min  Stress: No Stress Concern Present  . Feeling of Stress : Only a little  Social Connections: Moderately Integrated  . Frequency of Communication with Friends and Family: More than three times a week  . Frequency of Social Gatherings with Friends and Family: More than three times a week  . Attends Religious Services: Never  . Active Member of Clubs or Organizations: Yes  . Attends Archivist Meetings: More than 4 times per year  . Marital Status: Married    Tobacco Counseling Counseling given: Not Answered   Clinical Intake:  Pre-visit preparation completed: Yes  Pain : No/denies pain Pain Score: 0-No pain     Nutritional Status: BMI 25 -29 Overweight Nutritional Risks: None Diabetes: No  How often do you need to have someone help you when you read instructions, pamphlets, or other written materials from your doctor or pharmacy?: 1 - Never  Diabetic? No  Interpreter Needed?: No  Information entered by :: [facility], LPN   Activities of Daily Living In your present state of health, do you have any difficulty performing the following activities: 09/23/2020 10/03/2019  Hearing? N N  Vision? N N  Comment Wears eye glasses. -  Difficulty concentrating or making decisions? N N  Walking or climbing stairs? N N  Dressing or bathing? N N  Doing errands, shopping? N -  Preparing Food and eating ? N -  Using the Toilet? N -  In the past six months, have you accidently  leaked urine? Y -  Comment Occasionally with urgency. -  Do you have problems with loss of bowel control? N -  Managing your Medications? N -  Managing your Finances? N -  Housekeeping or managing your Housekeeping? N -  Some recent data might be hidden    Patient Care Team: Mar Daring, PA-C as PCP - General (Family Medicine) Carloyn Manner, MD as Referring Physician (Otolaryngology) Cleaster Corin, OD (Optometry)  Indicate any recent Medical Services you may have received from other than [facility] providers in the past year (date may be approximate).     Assessment:   This is a routine wellness examination for Avaiyah.  Hearing/Vision screen No exam data present  Dietary issues and exercise activities discussed: Current Exercise Habits: Home exercise routine, Type of exercise: walking, Time (Minutes): 60, Frequency (Times/Week):  5, Weekly Exercise (Minutes/Week): 300, Intensity: Moderate, Exercise limited by: None identified  Goals    . DIET - INCREASE WATER INTAKE     Recommend increasing water intake to 6-8 8 oz glasses a day.       Depression Screen PHQ 2/9 Scores 09/23/2020 09/21/2019 08/17/2018 08/23/2017 08/14/2015  PHQ - 2 Score 0 0 0 0 0    Fall Risk Fall Risk  09/23/2020 09/21/2019 09/21/2019 08/17/2018 08/23/2017  Falls in the past year? 0 0 0 No Yes  Number falls in past yr: 0 0 0 - -  Injury with Fall? 0 0 0 - -    Any stairs in or around the home? Yes  If so, are there any without handrails? No  Home free of loose throw rugs in walkways, pet beds, electrical cords, etc? Yes  Adequate lighting in your home to reduce risk of falls? Yes   ASSISTIVE DEVICES UTILIZED TO PREVENT FALLS:  Life alert? No  Use of a cane, walker or w/c? No  Grab bars in the bathroom? No  Shower chair or bench in shower? No  Elevated toilet seat or a handicapped toilet? Yes   TIMED UP AND GO:  Was the test performed? Yes .  Length of time to ambulate 10 feet: 10 sec.   Gait  steady and fast without use of assistive device  Cognitive Function:     6CIT Screen 09/23/2020 09/21/2019 08/17/2018  What Year? 0 points 0 points 0 points  What month? 0 points 0 points 0 points  What time? 0 points 0 points 0 points  Count back from 20 0 points 0 points 0 points  Months in reverse 0 points 0 points 0 points  Repeat phrase 2 points 2 points 2 points  Total Score 2 2 2     Immunizations Immunization History  Administered Date(s) Administered  . Fluad Quad(high Dose 65+) 07/26/2019  . Influenza, High Dose Seasonal PF 08/23/2017, 08/17/2018, 08/05/2020  . Influenza,inj,Quad PF,6+ Mos 08/08/2014, 08/14/2015, 08/14/2016  . PFIZER SARS-COV-2 Vaccination 12/15/2019, 01/05/2020  . Pneumococcal Conjugate-13 08/23/2017  . Pneumococcal Polysaccharide-23 08/08/2014, 09/29/2019  . Td 02/14/2005  . Zoster 12/18/2011  . Zoster Recombinat (Shingrix) 08/23/2017, 11/05/2017    TDAP status: Due, Education has been provided regarding the importance of this vaccine. Advised may receive this vaccine at local pharmacy or Health Dept. Aware to provide a copy of the vaccination record if obtained from local pharmacy or Health Dept. Verbalized acceptance and understanding. Flu Vaccine status: Up to date Pneumococcal vaccine status: Up to date Covid-19 vaccine status: Completed vaccines  Qualifies for Shingles Vaccine? Yes   Zostavax completed Yes   Shingrix Completed?: Yes  Screening Tests Health Maintenance  Topic Date Due  . TETANUS/TDAP  09/23/2021 (Originally 02/15/2015)  . MAMMOGRAM  11/06/2021  . DEXA SCAN  11/06/2021  . COLONOSCOPY  10/11/2028  . INFLUENZA VACCINE  Completed  . COVID-19 Vaccine  Completed  . Hepatitis C Screening  Completed  . PNA vac Low Risk Adult  Completed    Health Maintenance  There are no preventive care reminders to display for this patient.  Colorectal cancer screening: Completed 10/11/18. Repeat every 10 years Mammogram status: Completed  11/07/19. Repeat every year Bone Density status: Completed 11/07/19. Results reflect: Bone density results: OSTEOPENIA. Repeat every 2 years.  Lung Cancer Screening: (Low Dose CT Chest recommended if Age 23-80 years, 30 pack-year currently smoking OR have quit w/in 15years.) does not qualify.   Additional Screening:  Hepatitis  C Screening: Up to date  Vision Screening: Recommended annual ophthalmology exams for early detection of glaucoma and other disorders of the eye. Is the patient up to date with their annual eye exam?  Yes  Who is the provider or what is the name of the office in which the patient attends annual eye exams? Dr Algis Liming @ [address] If pt is not established with a provider, would they like to be referred to a provider to establish care? No .   Dental Screening: Recommended annual dental exams for proper oral hygiene  Community Resource Referral / Chronic Care Management: CRR required this visit?  No   CCM required this visit?  No      Plan:     I have personally reviewed and noted the following in the patient's chart:   . Medical and social history . Use of alcohol, tobacco or illicit drugs  . Current medications and supplements . Functional ability and status . Nutritional status . Physical activity . Advanced directives . List of other physicians . Hospitalizations, surgeries, and ER visits in previous 12 months . Vitals . Screenings to include cognitive, depression, and falls . Referrals and appointments  In addition, I have reviewed and discussed with patient certain preventive protocols, quality metrics, and best practice recommendations. A written personalized care plan for preventive services as well as general preventive health recommendations were provided to patient.     Japheth Diekman [place], [place]   [id number]/01/[id number]   Nurse Notes: None.

## 2020-09-20 NOTE — Progress Notes (Signed)
Complete physical exam   Patient: Allison [name]   DOB: [birth year]   69 y.o. Female  MRN: [id number] Visit Date: 09/23/2020  Today's healthcare provider: Mar Daring, PA-C   Chief Complaint  Patient presents with   Annual Exam   Subjective    Allison [name] is a 69 y.o. female who presents today for a complete physical exam.  She reports consuming a well balanced diet. Home exercise routine includes walks 5 times a week for 60 minutes. She generally feels well. She reports sleeping well. She does not have additional problems to discuss today.   HPI   Had her AWV today with [facility].  Lipid/Cholesterol, Follow-up  Last lipid panel Other pertinent labs  Lab Results  Component Value Date   CHOL 221 (H) 09/29/2019   HDL 99 09/29/2019   LDLCALC 111 (H) 09/29/2019   TRIG 64 09/29/2019   CHOLHDL 2.2 09/29/2019   Lab Results  Component Value Date   ALT 13 09/29/2019   AST 21 09/29/2019   PLT 325 09/29/2019   TSH 2.290 09/29/2019     Diet Controlled.   Past Medical History:  Diagnosis Date   Medical history non-contributory    Rosacea    Past Surgical History:  Procedure Laterality Date   BREAST BIOPSY Left 2002   benign   CATARACT EXTRACTION W/PHACO Right 07/06/2017   Procedure: CATARACT EXTRACTION PHACO AND INTRAOCULAR LENS PLACEMENT ([place]);  Surgeon: Birder Robson, MD;  Location: [facility] OR[facility];  Service: Ophthalmology;  Laterality: Right;  [place] 00:34.2 AP% 12.7 CDE 4.33 Fluid Pack lot # [id number]   CATARACT EXTRACTION W/PHACO Left 10/03/2019   Procedure: CATARACT EXTRACTION PHACO AND INTRAOCULAR LENS PLACEMENT ([place]) LEFT;  Surgeon: Birder Robson, MD;  Location: [facility];  Service: Ophthalmology;  Laterality: Left;  CDE 6.44 [place] 0:47.3   COLONOSCOPY WITH PROPOFOL N/A 10/11/2018   Procedure: COLONOSCOPY WITH PROPOFOL;  Surgeon: Lucilla Lame, MD;  Location: [facility] ENDOSCOPY;  Service: Endoscopy;  Laterality: N/A;   Social History    Socioeconomic History   Marital status: Married    Spouse name: Not on file   Number of children: 0   Years of education: Not on file   Highest education level: Master's degree (e.g., MA, MS, MEng, MEd, MSW, MBA)  Occupational History   Occupation: Armed forces operational officer  Tobacco Use   Smoking status: Never Smoker   Smokeless tobacco: Never Used  Scientific laboratory technician Use: Never used  Substance and Sexual Activity   Alcohol use: Yes    Alcohol/week: 14.0 standard drinks    Types: 14 Glasses of wine per week    Comment: 2 a day   Drug use: No   Sexual activity: Not on file  Other Topics Concern   Not on file  Social History Narrative   Not on file   Social Determinants of Health   Financial Resource Strain: Low Risk    Difficulty of Paying Living Expenses: Not hard at all  Food Insecurity: No Food Insecurity   Worried About Charity fundraiser in the Last Year: Never true   [place] in the Last Year: Never true  Transportation Needs: No Transportation Needs   Lack of Transportation (Medical): No   Lack of Transportation (Non-Medical): No  Physical Activity: Sufficiently Active   Days of Exercise per Week: 5 days   Minutes of Exercise per Session: 60 min  Stress: No Stress Concern Present   Feeling of  Stress : Only a little  Social Connections: Moderately Integrated   Frequency of Communication with Friends and Family: More than three times a week   Frequency of Social Gatherings with Friends and Family: More than three times a week   Attends Religious Services: Never   Marine scientist or Organizations: Yes   Attends Music therapist: More than 4 times per year   Marital Status: Married  Human resources officer Violence: Not At Risk   Fear of Current or Ex-Partner: No   Emotionally Abused: No   Physically Abused: No   Sexually Abused: No   Family Status  Relation Name Status   Mother  Deceased at age 42       bladder  cancer   Father  Deceased at age 8       CVA   Sister  Deceased at age 9       breast cancer   Brother  Alive   Brother  Alive   Family History  Problem Relation Age of Onset   Bladder Cancer Mother    Breast cancer Mother    Stroke Father    Hyperlipidemia Father    Breast cancer Sister 37   Kidney Stones Brother    Hyperlipidemia Brother    Kidney Stones Brother    Diverticulosis Brother    Allergies  Allergen Reactions   Strawberry Extract     difficulty breathing   Tetracycline Rash    Patient Care Team: Mar Daring, PA-C as PCP - General (Family Medicine) Carloyn Manner, MD as Referring Physician (Otolaryngology) Cleaster Corin, OD (Optometry)   Medications: Outpatient Medications Prior to Visit  Medication Sig   Calcium Carb-Cholecalciferol (CALCIUM + D3) 600-200 MG-UNIT TABS Take 2 tablets by mouth daily.    ferrous sulfate 325 (65 FE) MG tablet Take 325 mg by mouth daily with breakfast.   mometasone (ELOCON) 0.1 % lotion INSTILL 4 DROPS IN BOTH EAR CANALS EVERY NIGHT AT BEDTIME AS NEEDED FOR DRY SKIN AND ITCHING   RHOFADE 1 % CREA APPLY TO FACE QAM   No facility-administered medications prior to visit.    Review of Systems  Constitutional: Negative.   HENT: Negative.   Eyes: Negative.   Respiratory: Negative.   Cardiovascular: Negative.   Gastrointestinal: Negative.   Endocrine: Negative.   Genitourinary: Negative.   Musculoskeletal: Negative.   Skin: Negative.   Allergic/Immunologic: Negative.   Neurological: Negative.   Hematological: Negative.   Psychiatric/Behavioral: Negative.     Last CBC Lab Results  Component Value Date   WBC 4.6 09/29/2019   HGB 13.2 09/29/2019   HCT 38.9 09/29/2019   MCV 94 09/29/2019   MCH 31.7 09/29/2019   RDW 12.1 09/29/2019   PLT 325 35/46/5681   Last metabolic panel Lab Results  Component Value Date   GLUCOSE 86 09/29/2019   NA 137 09/29/2019   K 4.3 09/29/2019   CL 99  09/29/2019   CO2 23 09/29/2019   BUN 12 09/29/2019   CREATININE 0.69 09/29/2019   GFRNONAA 90 09/29/2019   GFRAA 104 09/29/2019   CALCIUM 9.7 09/29/2019   PROT 7.0 09/29/2019   ALBUMIN 4.8 09/29/2019   LABGLOB 2.2 09/29/2019   AGRATIO 2.2 09/29/2019   BILITOT 0.5 09/29/2019   ALKPHOS 60 09/29/2019   AST 21 09/29/2019   ALT 13 09/29/2019   ANIONGAP 6 (L) 12/02/2012      Objective     Vitals:  BP 137/72 (BP Location: Right Arm)  Pulse 70  Temp 98.2 F (36.8 C) (Oral)  Ht 5\' 9"  (1.753 m)  Wt 193 lb 9.6 oz (87.8 kg)  SpO2 99%  BMI 28.59 kg/m  BSA 2.07 m  Pain [place] 0-No pain    BP Readings from Last 3 Encounters:  09/23/20 137/72  06/27/20 116/76  10/03/19 97/74   Wt Readings from Last 3 Encounters:  09/23/20 193 lb 9.6 oz (87.8 kg)  06/27/20 192 lb 3.2 oz (87.2 kg)  10/03/19 188 lb (85.3 kg)      Physical Exam Vitals reviewed.  Constitutional:      General: She is not in acute distress.    Appearance: Normal appearance. She is well-developed, well-groomed and overweight. She is not ill-appearing or diaphoretic.  HENT:     Head: Normocephalic and atraumatic.     Right Ear: Tympanic membrane, ear canal and external ear normal.     Left Ear: Tympanic membrane, ear canal and external ear normal.     Nose: Nose normal.     Mouth/Throat:     Mouth: Mucous membranes are moist.     Pharynx: Oropharynx is clear. No oropharyngeal exudate or posterior oropharyngeal erythema.  Eyes:     General: No scleral icterus.       Right eye: No discharge.        Left eye: No discharge.     Extraocular Movements: Extraocular movements intact.     Conjunctiva/sclera: Conjunctivae normal.     Pupils: Pupils are equal, round, and reactive to light.  Neck:     Thyroid: No thyromegaly.     Vascular: No carotid bruit or JVD.     Trachea: No tracheal deviation.  Cardiovascular:     Rate and Rhythm: Normal rate and regular rhythm.     Pulses: Normal pulses.     Heart sounds:  Normal heart sounds. No murmur heard.  No friction rub. No gallop.   Pulmonary:     Effort: Pulmonary effort is normal. No respiratory distress.     Breath sounds: Normal breath sounds. No wheezing or rales.  Chest:     Chest wall: No tenderness.  Abdominal:     General: Abdomen is flat. Bowel sounds are normal. There is no distension.     Palpations: Abdomen is soft. There is no mass.     Tenderness: There is no abdominal tenderness. There is no guarding or rebound.  Musculoskeletal:        General: No tenderness. Normal range of motion.     Cervical back: Normal range of motion and neck supple. No tenderness.     Right lower leg: No edema.     Left lower leg: No edema.  Lymphadenopathy:     Cervical: No cervical adenopathy.  Skin:    General: Skin is warm and dry.     Capillary Refill: Capillary refill takes less than 2 seconds.     Findings: No rash.  Neurological:     General: No focal deficit present.     Mental Status: She is alert and oriented to person, place, and time. Mental status is at baseline.     Motor: No weakness.     Gait: Gait normal.  Psychiatric:        Mood and Affect: Mood normal.        Behavior: Behavior normal. Behavior is cooperative.        Thought Content: Thought content normal.        Judgment: Judgment normal.  Last depression screening scores PHQ 2/9 Scores 09/23/2020 09/21/2019 08/17/2018  PHQ - 2 Score 0 0 0   Last fall risk screening Fall Risk  09/23/2020  Falls in the past year? 0  Number falls in past yr: 0  Injury with Fall? 0   Last Audit-C alcohol use screening Alcohol Use Disorder Test (AUDIT) 09/23/2020  1. How often do you have a drink containing alcohol? 4  2. How many drinks containing alcohol do you have on a typical day when you are drinking? 0  3. How often do you have six or more drinks on one occasion? 0  AUDIT-C Score 4  4. How often during the last year have you found that you were not able to stop drinking once  you had started? 0  5. How often during the last year have you failed to do what was normally expected from you because of drinking? 0  6. How often during the last year have you needed a first drink in the morning to get yourself going after a heavy drinking session? 0  7. How often during the last year have you had a feeling of guilt of remorse after drinking? 0  8. How often during the last year have you been unable to remember what happened the night before because you had been drinking? 0  9. Have you or someone else been injured as a result of your drinking? 0  10. Has a relative or friend or a doctor or another health worker been concerned about your drinking or suggested you cut down? 0  Alcohol Use Disorder Identification Test Final Score (AUDIT) 4   A score of 3 or more in women, and 4 or more in men indicates increased risk for alcohol abuse, EXCEPT if all of the points are from question 1   No results found for any visits on 09/23/20.  Assessment & Plan    Routine Health Maintenance and Physical Exam  Exercise Activities and Dietary recommendations Goals     DIET - INCREASE WATER INTAKE     Recommend increasing water intake to 6-8 8 oz glasses a day.        Immunization History  Administered Date(s) Administered   Fluad Quad(high Dose 65+) 07/26/2019   Influenza, High Dose Seasonal PF 08/23/2017, 08/17/2018, 08/05/2020   Influenza,inj,Quad PF,6+ Mos 08/08/2014, 08/14/2015, 08/14/2016   PFIZER SARS-COV-2 Vaccination 12/15/2019, 01/05/2020   Pneumococcal Conjugate-13 08/23/2017   Pneumococcal Polysaccharide-23 08/08/2014, 09/29/2019   Td 02/14/2005   Zoster 12/18/2011   Zoster Recombinat (Shingrix) 08/23/2017, 11/05/2017    Health Maintenance  Topic Date Due   TETANUS/TDAP  09/23/2021 (Originally 02/15/2015)   MAMMOGRAM  11/06/2021   DEXA SCAN  11/06/2021   COLONOSCOPY  10/11/2028   INFLUENZA VACCINE  Completed   COVID-19 Vaccine  Completed    Hepatitis C Screening  Completed   PNA vac Low Risk Adult  Completed    Discussed health benefits of physical activity, and encouraged her to engage in regular exercise appropriate for her age and condition.  1. Annual physical exam Normal physical exam today. Will check labs as below and f/u pending lab results. If labs are stable and WNL she will not need to have these rechecked for one year at her next annual physical exam. She is to call the office in the meantime if she has any acute issue, questions or concerns. - CBC with Differential/Platelet - Comprehensive metabolic panel  2. Encounter for breast cancer screening using non-mammogram modality  There is family history of breast cancer in her mother and sister. She does perform regular self breast exams. Mammogram was ordered as below. Information for [facility] was given to patient so she may schedule her mammogram at her convenience. - MM 3D SCREEN BREAST BILATERAL; Future  3. H/O hyperthyroidism Will check labs as below and f/u pending results. - TSH  4. BMI 28.0-28.9,adult Counseled patient on healthy lifestyle modifications including dieting and exercise.  Will check labs as below and f/u pending results. - CBC with Differential/Platelet - Comprehensive metabolic panel - Hemoglobin A1c - Lipid panel - Magnesium  5. Vitamin D deficiency Will check labs as below and f/u pending results. Patient has osteopenia, h/o vit d def and is postmenopausal.  - Vitamin D (25 hydroxy)   No follow-ups on file.     Reynolds Bowl, PA-C, have reviewed all documentation for this visit. The documentation on 09/23/20 for the exam, diagnosis, procedures, and orders are all accurate and complete.   [place]  [facility] [phone] (phone) [phone] (fax)  [place]

## 2020-09-23 ENCOUNTER — Ambulatory Visit (INDEPENDENT_AMBULATORY_CARE_PROVIDER_SITE_OTHER): Payer: Medicare PPO

## 2020-09-23 ENCOUNTER — Ambulatory Visit (INDEPENDENT_AMBULATORY_CARE_PROVIDER_SITE_OTHER): Payer: Medicare PPO | Admitting: Physician Assistant

## 2020-09-23 ENCOUNTER — Other Ambulatory Visit: Payer: Self-pay

## 2020-09-23 ENCOUNTER — Encounter: Payer: Self-pay | Admitting: Physician Assistant

## 2020-09-23 VITALS — BP 137/72 | HR 70 | Temp 98.2°F | Ht 69.0 in | Wt 193.6 lb

## 2020-09-23 DIAGNOSIS — Z0000 Encounter for general adult medical examination without abnormal findings: Secondary | ICD-10-CM

## 2020-09-23 DIAGNOSIS — E559 Vitamin D deficiency, unspecified: Secondary | ICD-10-CM

## 2020-09-23 DIAGNOSIS — Z6828 Body mass index (BMI) 28.0-28.9, adult: Secondary | ICD-10-CM | POA: Diagnosis not present

## 2020-09-23 DIAGNOSIS — Z1239 Encounter for other screening for malignant neoplasm of breast: Secondary | ICD-10-CM

## 2020-09-23 DIAGNOSIS — Z8639 Personal history of other endocrine, nutritional and metabolic disease: Secondary | ICD-10-CM

## 2020-09-23 NOTE — Patient Instructions (Signed)
[facility] at [place],  [place]  [address] [address]: [phone]   Preventive Care 47 Years and Older, Female Preventive care refers to lifestyle choices and visits with your health care provider that can promote health and wellness. This includes:  A yearly physical exam. This is also called an annual well check.  Regular dental and eye exams.  Immunizations.  Screening for certain conditions.  Healthy lifestyle choices, such as diet and exercise. What can I expect for my preventive care visit? Physical exam Your health care provider will check:  Height and weight. These may be used to calculate body mass index (BMI), which is a measurement that tells if you are at a healthy weight.  Heart rate and blood pressure.  Your skin for abnormal spots. Counseling Your health care provider may ask you questions about:  Alcohol, tobacco, and drug use.  Emotional well-being.  Home and relationship well-being.  Sexual activity.  Eating habits.  History of falls.  Memory and ability to understand (cognition).  Work and work Statistician.  Pregnancy and menstrual history. What immunizations do I need?  Influenza (flu) vaccine  This is recommended every year. Tetanus, diphtheria, and pertussis (Tdap) vaccine  You may need a Td booster every 10 years. Varicella (chickenpox) vaccine  You may need this vaccine if you have not already been vaccinated. Zoster (shingles) vaccine  You may need this after age 75. Pneumococcal conjugate (PCV13) vaccine  One dose is recommended after age 69. Pneumococcal polysaccharide (PPSV23) vaccine  One dose is recommended after age 66. Measles, mumps, and rubella (MMR) vaccine  You may need at least one dose of MMR if you were born in 1957 or later. You may also need a second dose. Meningococcal conjugate (MenACWY) vaccine  You may need this if you have certain conditions. Hepatitis A  vaccine  You may need this if you have certain conditions or if you travel or work in places where you may be exposed to hepatitis A. Hepatitis B vaccine  You may need this if you have certain conditions or if you travel or work in places where you may be exposed to hepatitis B. Haemophilus influenzae type b (Hib) vaccine  You may need this if you have certain conditions. You may receive vaccines as individual doses or as more than one vaccine together in one shot (combination vaccines). Talk with your health care provider about the risks and benefits of combination vaccines. What tests do I need? Blood tests  Lipid and cholesterol levels. These may be checked every 5 years, or more frequently depending on your overall health.  Hepatitis C test.  Hepatitis B test. Screening  Lung cancer screening. You may have this screening every year starting at age 23 if you have a 30-pack-year history of smoking and currently smoke or have quit within the past 15 years.  Colorectal cancer screening. All adults should have this screening starting at age 36 and continuing until age 42. Your health care provider may recommend screening at age 73 if you are at increased risk. You will have tests every 1-10 years, depending on your results and the type of screening test.  Diabetes screening. This is done by checking your blood sugar (glucose) after you have not eaten for a while (fasting). You may have this done every 1-3 years.  Mammogram. This may be done every 1-2 years. Talk with your health care provider about how often you should have regular mammograms.  BRCA-related cancer screening. This may be done if you have a family history of breast, ovarian, tubal, or peritoneal cancers. Other tests  Sexually transmitted disease (STD) testing.  Bone density scan. This is done to screen for osteoporosis. You may have this done starting at age 69. Follow these instructions at home: Eating and  drinking  Eat a diet that includes fresh fruits and vegetables, whole grains, lean protein, and low-fat dairy products. Limit your intake of foods with high amounts of sugar, saturated fats, and salt.  Take vitamin and mineral supplements as recommended by your health care provider.  Do not drink alcohol if your health care provider tells you not to drink.  If you drink alcohol: ? Limit how much you have to 0-1 drink a day. ? Be aware of how much alcohol is in your drink. In the [place] one drink equals one 12 oz bottle of beer (355 mL), one 5 oz glass of wine (148 mL), or one 1 oz glass of hard liquor (44 mL). Lifestyle  Take daily care of your teeth and gums.  Stay active. Exercise for at least 30 minutes on 5 or more days each week.  Do not use any products that contain nicotine or tobacco, such as cigarettes, e-cigarettes, and chewing tobacco. If you need help quitting, ask your health care provider.  If you are sexually active, practice safe sex. Use a condom or other form of protection in order to prevent STIs (sexually transmitted infections).  Talk with your health care provider about taking a low-dose aspirin or statin. What's next?  Go to your health care provider once a year for a well check visit.  Ask your health care provider how often you should have your eyes and teeth checked.  Stay up to date on all vaccines. This information is not intended to replace advice given to you by your health care provider. Make sure you discuss any questions you have with your health care provider. Document Revised: 10/27/2018 Document Reviewed: 10/27/2018 Elsevier Patient Education  2020 [employer].

## 2020-09-23 NOTE — Patient Instructions (Signed)
[name] , Thank you for taking time to come for your Medicare Wellness Visit. I appreciate your ongoing commitment to your health goals. Please review the following plan we discussed and let me know if I can assist you in the future.   Screening recommendations/referrals: Colonoscopy: Up to date, due 09/2028 Mammogram: Up to date, due 10/2020 Bone Density: Up to date, due 10/2021 Recommended yearly ophthalmology/optometry visit for glaucoma screening and checkup Recommended yearly dental visit for hygiene and checkup  Vaccinations: Influenza vaccine: Done 08/05/20 Pneumococcal vaccine: Completed series Tdap vaccine: Currently due, declined at this time.  Shingles vaccine: Completed series    Advanced directives: Please bring a copy of your POA (Power of Attorney) and/or Living Will to your next appointment.   Conditions/risks identified: Continue to increasing water intake to 6-8 8 oz glasses a day.   Next appointment: 10:20 AM today with [place] 68 Years and Older, Female Preventive care refers to lifestyle choices and visits with your health care provider that can promote health and wellness. What does preventive care include?  A yearly physical exam. This is also called an annual well check.  Dental exams once or twice a year.  Routine eye exams. Ask your health care provider how often you should have your eyes checked.  Personal lifestyle choices, including:  Daily care of your teeth and gums.  Regular physical activity.  Eating a healthy diet.  Avoiding tobacco and drug use.  Limiting alcohol use.  Practicing safe sex.  Taking low-dose aspirin every day.  Taking vitamin and mineral supplements as recommended by your health care provider. What happens during an annual well check? The services and screenings done by your health care provider during your annual well check will depend on your age, overall health, lifestyle risk  factors, and family history of disease. Counseling  Your health care provider may ask you questions about your:  Alcohol use.  Tobacco use.  Drug use.  Emotional well-being.  Home and relationship well-being.  Sexual activity.  Eating habits.  History of falls.  Memory and ability to understand (cognition).  Work and work Statistician.  Reproductive health. Screening  You may have the following tests or measurements:  Height, weight, and BMI.  Blood pressure.  Lipid and cholesterol levels. These may be checked every 5 years, or more frequently if you are over 23 years old.  Skin check.  Lung cancer screening. You may have this screening every year starting at age 44 if you have a 30-pack-year history of smoking and currently smoke or have quit within the past 15 years.  Fecal occult blood test (FOBT) of the stool. You may have this test every year starting at age 49.  Flexible sigmoidoscopy or colonoscopy. You may have a sigmoidoscopy every 5 years or a colonoscopy every 10 years starting at age 50.  Hepatitis C blood test.  Hepatitis B blood test.  Sexually transmitted disease (STD) testing.  Diabetes screening. This is done by checking your blood sugar (glucose) after you have not eaten for a while (fasting). You may have this done every 1-3 years.  Bone density scan. This is done to screen for osteoporosis. You may have this done starting at age 32.  Mammogram. This may be done every 1-2 years. Talk to your health care provider about how often you should have regular mammograms. Talk with your health care provider about your test results, treatment options, and if necessary, the need for  more tests. Vaccines  Your health care provider may recommend certain vaccines, such as:  Influenza vaccine. This is recommended every year.  Tetanus, diphtheria, and acellular pertussis (Tdap, Td) vaccine. You may need a Td booster every 10 years.  Zoster vaccine. You  may need this after age 41.  Pneumococcal 13-valent conjugate (PCV13) vaccine. One dose is recommended after age 37.  Pneumococcal polysaccharide (PPSV23) vaccine. One dose is recommended after age 81. Talk to your health care provider about which screenings and vaccines you need and how often you need them. This information is not intended to replace advice given to you by your health care provider. Make sure you discuss any questions you have with your health care provider. Document Released: 11/29/2015 Document Revised: 07/22/2016 Document Reviewed: 09/03/2015 Elsevier Interactive Patient Education  2017 [place] Prevention in the Home Falls can cause injuries. They can happen to people of all ages. There are many things you can do to make your home safe and to help prevent falls. What can I do on the outside of my home?  Regularly fix the edges of walkways and driveways and fix any cracks.  Remove anything that might make you trip as you walk through a door, such as a raised step or threshold.  Trim any bushes or trees on the path to your home.  Use bright outdoor lighting.  Clear any walking paths of anything that might make someone trip, such as rocks or tools.  Regularly check to see if handrails are loose or broken. Make sure that both sides of any steps have handrails.  Any raised decks and porches should have guardrails on the edges.  Have any leaves, snow, or ice cleared regularly.  Use sand or salt on walking paths during winter.  Clean up any spills in your garage right away. This includes oil or grease spills. What can I do in the bathroom?  Use night lights.  Install grab bars by the toilet and in the tub and shower. Do not use towel bars as grab bars.  Use non-skid mats or decals in the tub or shower.  If you need to sit down in the shower, use a plastic, non-slip stool.  Keep the floor dry. Clean up any water that spills on the floor as soon as  it happens.  Remove soap buildup in the tub or shower regularly.  Attach bath mats securely with double-sided non-slip rug tape.  Do not have throw rugs and other things on the floor that can make you trip. What can I do in the bedroom?  Use night lights.  Make sure that you have a light by your bed that is easy to reach.  Do not use any sheets or blankets that are too big for your bed. They should not hang down onto the floor.  Have a firm chair that has side arms. You can use this for support while you get dressed.  Do not have throw rugs and other things on the floor that can make you trip. What can I do in the kitchen?  Clean up any spills right away.  Avoid walking on wet floors.  Keep items that you use a lot in easy-to-reach places.  If you need to reach something above you, use a strong step stool that has a grab bar.  Keep electrical cords out of the way.  Do not use floor polish or wax that makes floors slippery. If you must use wax, use non-skid  floor wax.  Do not have throw rugs and other things on the floor that can make you trip. What can I do with my stairs?  Do not leave any items on the stairs.  Make sure that there are handrails on both sides of the stairs and use them. Fix handrails that are broken or loose. Make sure that handrails are as long as the stairways.  Check any carpeting to make sure that it is firmly attached to the stairs. Fix any carpet that is loose or worn.  Avoid having throw rugs at the top or bottom of the stairs. If you do have throw rugs, attach them to the floor with carpet tape.  Make sure that you have a light switch at the top of the stairs and the bottom of the stairs. If you do not have them, ask someone to add them for you. What else can I do to help prevent falls?  Wear shoes that:  Do not have high heels.  Have rubber bottoms.  Are comfortable and fit you well.  Are closed at the toe. Do not wear sandals.  If  you use a stepladder:  Make sure that it is fully opened. Do not climb a closed stepladder.  Make sure that both sides of the stepladder are locked into place.  Ask someone to hold it for you, if possible.  Clearly mark and make sure that you can see:  Any grab bars or handrails.  First and last steps.  Where the edge of each step is.  Use tools that help you move around (mobility aids) if they are needed. These include:  Canes.  Walkers.  Scooters.  Crutches.  Turn on the lights when you go into a dark area. Replace any light bulbs as soon as they burn out.  Set up your furniture so you have a clear path. Avoid moving your furniture around.  If any of your floors are uneven, fix them.  If there are any pets around you, be aware of where they are.  Review your medicines with your doctor. Some medicines can make you feel dizzy. This can increase your chance of falling. Ask your doctor what other things that you can do to help prevent falls. This information is not intended to replace advice given to you by your health care provider. Make sure you discuss any questions you have with your health care provider. Document Released: 08/29/2009 Document Revised: 04/09/2016 Document Reviewed: 12/07/2014 Elsevier Interactive Patient Education  2017 [employer].

## 2020-09-24 LAB — LIPID PANEL
Chol/HDL Ratio: 2.3 ratio (ref 0.0–4.4)
Cholesterol, Total: 215 mg/dL — ABNORMAL HIGH (ref 100–199)
HDL: 94 mg/dL (ref >39)
LDL Chol Calc (NIH): 111 mg/dL — ABNORMAL HIGH (ref 0–99)
Triglycerides: 56 mg/dL (ref 0–149)
VLDL Cholesterol Cal: 10 mg/dL (ref 5–40)

## 2020-09-24 LAB — COMPREHENSIVE METABOLIC PANEL
ALT: 14 IU/L (ref 0–32)
AST: 21 IU/L (ref 0–40)
Albumin/Globulin Ratio: 2.3 — ABNORMAL HIGH (ref 1.2–2.2)
Albumin: 4.8 g/dL (ref 3.8–4.8)
Alkaline Phosphatase: 63 IU/L (ref 44–121)
BUN/Creatinine Ratio: 14 (ref 12–28)
BUN: 10 mg/dL (ref 8–27)
Bilirubin Total: 0.4 mg/dL (ref 0.0–1.2)
CO2: 24 mmol/L (ref 20–29)
Calcium: 9.6 mg/dL (ref 8.7–10.3)
Chloride: 102 mmol/L (ref 96–106)
Creatinine, Ser: 0.69 mg/dL (ref 0.57–1.00)
GFR calc Af Amer: 103 mL/min/{1.73_m2} (ref >59)
GFR calc non Af Amer: 90 mL/min/{1.73_m2} (ref >59)
Globulin, Total: 2.1 g/dL (ref 1.5–4.5)
Glucose: 85 mg/dL (ref 65–99)
Potassium: 4.6 mmol/L (ref 3.5–5.2)
Sodium: 138 mmol/L (ref 134–144)
Total Protein: 6.9 g/dL (ref 6.0–8.5)

## 2020-09-24 LAB — MAGNESIUM: Magnesium: 2.2 mg/dL (ref 1.6–2.3)

## 2020-09-24 LAB — CBC WITH DIFFERENTIAL/PLATELET
Basophils Absolute: 0.1 10*3/uL (ref 0.0–0.2)
Basos: 2 %
EOS (ABSOLUTE): 0.2 10*3/uL (ref 0.0–0.4)
Eos: 4 %
Hematocrit: 35.7 % (ref 34.0–46.6)
Hemoglobin: 12.1 g/dL (ref 11.1–15.9)
Immature Grans (Abs): 0 10*3/uL (ref 0.0–0.1)
Immature Granulocytes: 0 %
Lymphocytes Absolute: 1.1 10*3/uL (ref 0.7–3.1)
Lymphs: 26 %
MCH: 31 pg (ref 26.6–33.0)
MCHC: 33.9 g/dL (ref 31.5–35.7)
MCV: 92 fL (ref 79–97)
Monocytes Absolute: 0.4 10*3/uL (ref 0.1–0.9)
Monocytes: 8 %
Neutrophils Absolute: 2.7 10*3/uL (ref 1.4–7.0)
Neutrophils: 60 %
Platelets: 359 10*3/uL (ref 150–450)
RBC: 3.9 x10E6/uL (ref 3.77–5.28)
RDW: 12.4 % (ref 11.7–15.4)
WBC: 4.4 10*3/uL (ref 3.4–10.8)

## 2020-09-24 LAB — TSH: TSH: 1.32 u[IU]/mL (ref 0.450–4.500)

## 2020-09-24 LAB — HEMOGLOBIN A1C
Est. average glucose Bld gHb Est-mCnc: 117 mg/dL
Hgb A1c MFr Bld: 5.7 % — ABNORMAL HIGH (ref 4.8–5.6)

## 2020-09-24 LAB — VITAMIN D 25 HYDROXY (VIT D DEFICIENCY, FRACTURES): Vit D, 25-Hydroxy: 28.5 ng/mL — ABNORMAL LOW (ref 30.0–100.0)

## 2020-09-25 ENCOUNTER — Encounter: Payer: Self-pay | Admitting: Physician Assistant

## 2020-09-25 DIAGNOSIS — Z1159 Encounter for screening for other viral diseases: Secondary | ICD-10-CM | POA: Diagnosis not present

## 2020-09-25 DIAGNOSIS — E559 Vitamin D deficiency, unspecified: Secondary | ICD-10-CM

## 2020-09-25 MED ORDER — VITAMIN D (ERGOCALCIFEROL) 1.25 MG (50000 UNIT) PO CAPS: 50000.0000 [IU] | ORAL_CAPSULE | ORAL | 0 refills | Status: DC

## 2020-11-06 DIAGNOSIS — Z20822 Contact with and (suspected) exposure to covid-19: Secondary | ICD-10-CM | POA: Diagnosis not present

## 2020-11-07 ENCOUNTER — Other Ambulatory Visit: Payer: Self-pay

## 2020-11-07 ENCOUNTER — Ambulatory Visit
Admission: RE | Admit: 2020-11-07 | Discharge: 2020-11-07 | Disposition: A | Discharge status: Home / Self Care | Payer: Medicare PPO | Source: Ambulatory Visit | Attending: Physician Assistant | Admitting: Physician Assistant

## 2020-11-07 DIAGNOSIS — Z1231 Encounter for screening mammogram for malignant neoplasm of breast: Secondary | ICD-10-CM | POA: Insufficient documentation

## 2020-11-07 DIAGNOSIS — Z1239 Encounter for other screening for malignant neoplasm of breast: Secondary | ICD-10-CM

## 2020-11-08 DIAGNOSIS — Z20822 Contact with and (suspected) exposure to covid-19: Secondary | ICD-10-CM | POA: Diagnosis not present

## 2020-11-24 ENCOUNTER — Encounter: Payer: Self-pay | Admitting: Physician Assistant

## 2020-11-26 DIAGNOSIS — H6123 Impacted cerumen, bilateral: Secondary | ICD-10-CM | POA: Diagnosis not present

## 2020-11-26 DIAGNOSIS — R1314 Dysphagia, pharyngoesophageal phase: Secondary | ICD-10-CM | POA: Diagnosis not present

## 2020-11-27 ENCOUNTER — Telehealth (INDEPENDENT_AMBULATORY_CARE_PROVIDER_SITE_OTHER): Payer: Medicare PPO | Admitting: Physician Assistant

## 2020-11-27 ENCOUNTER — Encounter: Payer: Self-pay | Admitting: Physician Assistant

## 2020-11-27 DIAGNOSIS — M5432 Sciatica, left side: Secondary | ICD-10-CM

## 2020-11-27 MED ORDER — METHYLPREDNISOLONE 4 MG PO TBPK: ORAL_TABLET | ORAL | 0 refills | Status: DC

## 2020-11-27 MED ORDER — CYCLOBENZAPRINE HCL 5 MG PO TABS: 5.0000 mg | ORAL_TABLET | Freq: Three times a day (TID) | ORAL | 1 refills | Status: DC | PRN

## 2020-11-27 NOTE — Progress Notes (Unsigned)
MyChart Video Visit    Virtual Visit via Video Note   This visit type was conducted due to national recommendations for restrictions regarding the COVID-19 Pandemic (e.g. social distancing) in an effort to limit this patient's exposure and mitigate transmission in our community. This patient is at least at moderate risk for complications without adequate follow up. This format is felt to be most appropriate for this patient at this time. Physical exam was limited by quality of the video and audio technology used for the visit.   Patient location: Home Provider location: [facility]  I discussed the limitations of evaluation and management by telemedicine and the availability of in person appointments. The patient expressed understanding and agreed to proceed.  Patient: Allison [name]   DOB: 1951-08-19   70 y.o. Female  MRN: [id number] Visit Date: 11/27/2020  Today's healthcare provider: Mar Daring, PA-C   Chief Complaint  Patient presents with  . Pain   Subjective    HPI  Patient with c/o sciatic nerve flare up. She has tried hot and ice pack and Tylenol. She reports that last week she had to go to a conference and was sitting in low chairs and also walking on hard floors. Started feeling her back muscles tighten then started having the radiating symptoms like previously. Reports it has been a year or two since she last had a sciatic flare. Not having any weakness or loss of bowel and bladder control.   Patient Active Problem List   Diagnosis Date Noted  . Special screening for malignant neoplasms, colon   . Polyp of sigmoid colon   . Blood in the urine 08/13/2015  . H/O hyperthyroidism 08/13/2015  . H/O amblyopia 08/13/2015   Past Medical History:  Diagnosis Date  . Medical history non-contributory   . Rosacea       Medications: Outpatient Medications Prior to Visit  Medication Sig  . Calcium Carb-Cholecalciferol (CALCIUM + D3) 600-200  MG-UNIT TABS Take 2 tablets by mouth daily.   . ferrous sulfate 325 (65 FE) MG tablet Take 325 mg by mouth daily with breakfast.  . RHOFADE 1 % CREA APPLY TO FACE QAM  . Vitamin D, Ergocalciferol, (DRISDOL) 1.25 MG (50000 UNIT) CAPS capsule Take 1 capsule (50,000 Units total) by mouth every 30 (thirty) days.  . mometasone (ELOCON) 0.1 % lotion INSTILL 4 DROPS IN BOTH EAR CANALS EVERY NIGHT AT BEDTIME AS NEEDED FOR DRY SKIN AND ITCHING (Patient not taking: Reported on 11/27/2020)   No facility-administered medications prior to visit.    Review of Systems  Constitutional: Negative.   Respiratory: Negative.   Cardiovascular: Negative.   Musculoskeletal: Positive for back pain and myalgias.  Neurological: Negative for weakness and numbness.    @AMBLABREVIEWLINK @  Objective    There were no vitals taken for this visit. BP Readings from Last 3 Encounters:  09/23/20 137/72  06/27/20 116/76  10/03/19 97/74   Wt Readings from Last 3 Encounters:  09/23/20 193 lb 9.6 oz (87.8 kg)  06/27/20 192 lb 3.2 oz (87.2 kg)  10/03/19 188 lb (85.3 kg)      Physical Exam Vitals reviewed.  Constitutional:      General: She is not in acute distress.    Appearance: Normal appearance. She is well-developed and well-nourished. She is not ill-appearing.  HENT:     Head: Normocephalic and atraumatic.  Eyes:     Extraocular Movements: EOM normal.  Pulmonary:     Effort: Pulmonary effort  is normal. No respiratory distress.  Musculoskeletal:     Cervical back: Normal range of motion and neck supple.  Neurological:     Mental Status: She is alert.  Psychiatric:        Mood and Affect: Mood and affect and mood normal.        Behavior: Behavior normal.        Thought Content: Thought content normal.        Judgment: Judgment normal.        Assessment & Plan     1. Sciatica of left side Will give medrol and flexeril as below. Heating pad and stretches can help. Call if symptoms not responding  to treatments and seek in person evaluation.  - methylPREDNISolone (MEDROL) 4 MG TBPK tablet; 6 day taper; take as directed on package instructions  Dispense: 21 tablet; Refill: 0 - cyclobenzaprine (FLEXERIL) 5 MG tablet; Take 1 tablet (5 mg total) by mouth 3 (three) times daily as needed for muscle spasms.  Dispense: 30 tablet; Refill: 1   No follow-ups on file.     I discussed the assessment and treatment plan with the patient. The patient was provided an opportunity to ask questions and all were answered. The patient agreed with the plan and demonstrated an understanding of the instructions.   The patient was advised to call back or seek an in-person evaluation if the symptoms worsen or if the condition fails to improve as anticipated.  I provided 12 minutes of face-to-face time during this encounter via MyChart Video enabled encounter.  Reynolds Bowl, PA-C, have reviewed all documentation for this visit. The documentation on 11/28/20 for the exam, diagnosis, procedures, and orders are all accurate and complete.  [place] [facility] [phone] (phone) [phone] (fax)  [place]

## 2020-11-28 ENCOUNTER — Encounter: Payer: Self-pay | Admitting: Physician Assistant

## 2020-11-28 NOTE — Telephone Encounter (Unsigned)
Copied from [place] [id number]. Topic: Appointment Scheduling - Scheduling Inquiry for Clinic >> Nov 27, 2020  5:21 PM Yvette Rack wrote: Reason for CRM: Pt stated she just had a missed call from the office so she was returning the call because she has an appt today at 5:40 pm. Pt requests call back

## 2020-11-28 NOTE — Patient Instructions (Signed)

## 2020-12-31 ENCOUNTER — Other Ambulatory Visit: Payer: Self-pay | Admitting: Physician Assistant

## 2020-12-31 DIAGNOSIS — E559 Vitamin D deficiency, unspecified: Secondary | ICD-10-CM

## 2020-12-31 NOTE — Telephone Encounter (Signed)
Requested medication (s) are due for refill today:   Requested medication (s) are on the active medication list: yes  Last refill: 09/25/2020  Future visit scheduled: yes  Notes to clinic:  50,000 IU strengths are not delegated   Requested Prescriptions  Pending Prescriptions Disp Refills   Vitamin D, Ergocalciferol, (DRISDOL) 1.25 MG (50000 UNIT) CAPS capsule [Pharmacy Med Name: VITAMIN D2 50,000IU (ERGO) CAP RX] 12 capsule 0    Sig: TAKE ONE CAPSULE BY MOUTH EVERY 30 DAYS      Endocrinology:  Vitamins - Vitamin D Supplementation Failed - 12/31/2020  1:12 PM      Failed - 50,000 IU strengths are not delegated      Failed - Phosphate in normal range and within 360 days    No results found for: PHOS        Failed - Vitamin D in normal range and within 360 days    Vit D, 25-Hydroxy  Date Value Ref Range Status  09/23/2020 28.5 (L) 30.0 - 100.0 ng/mL Final    Comment:    Vitamin D deficiency has been defined by the Institute of Medicine and an Endocrine Society practice guideline as a level of serum 25-OH vitamin D less than 20 ng/mL (1,2). The Endocrine Society went on to further define vitamin D insufficiency as a level between 21 and 29 ng/mL (2). 1. IOM (Institute of Medicine). 2010. Dietary reference    intakes for calcium and D. [place]: The    [employer]. 2. Holick MF, Binkley [place], Bischoff-Ferrari HA, et al.    Evaluation, treatment, and prevention of vitamin D    deficiency: an Endocrine Society clinical practice    guideline. JCEM. 2011 Jul; 96(7):1911-30.           Passed - Ca in normal range and within 360 days    Calcium  Date Value Ref Range Status  09/23/2020 9.6 8.7 - 10.3 mg/dL Final   Calcium, Total  Date Value Ref Range Status  12/02/2012 9.1 8.5 - 10.1 mg/dL Final          Passed - Valid encounter within last 12 months    Recent Outpatient Visits           1 month ago Sciatica of left side   [place], [place]   3 months ago Annual physical exam   [facility] [place] M[place] M, [place]   6 months ago [place], Dionne Bucy, MD   8 months ago Skin lesion   [facility] [place], Clearnce Sorrel, [place]   1 year ago Annual physical exam   [place], Clearnce Sorrel, [place]       Future Appointments             In 9 months [place], Clearnce Sorrel, PA-C [employer], [place]

## 2020-12-31 NOTE — Telephone Encounter (Signed)
I called pt to schedule an appointment due to the nature of the prescription request and the necessary OV every 3 mo and pt reported that she does not need a refill as she has a year supply. However pt would like to know if she needs blood work in the near future to recheck calcium levels.

## 2021-01-01 ENCOUNTER — Telehealth: Payer: Self-pay

## 2021-01-01 NOTE — Telephone Encounter (Signed)
Copied from [place] [id number]. Topic: General - Other >> Jan 01, 2021  1:09 PM Yvette Rack wrote: Reason for CRM: Pt requested to speak with [place]. Pt requests call back. Cb# [phone]

## 2021-01-05 ENCOUNTER — Encounter: Payer: Self-pay | Admitting: Physician Assistant

## 2021-01-05 DIAGNOSIS — E559 Vitamin D deficiency, unspecified: Secondary | ICD-10-CM

## 2021-01-07 NOTE — Addendum Note (Signed)
Addended by: Mar Daring on: 01/07/2021 05:23 PM   Modules accepted: Orders

## 2021-02-24 ENCOUNTER — Encounter: Payer: Self-pay | Admitting: Podiatry

## 2021-02-24 ENCOUNTER — Ambulatory Visit: Payer: Medicare PPO | Admitting: Podiatry

## 2021-02-24 ENCOUNTER — Ambulatory Visit (INDEPENDENT_AMBULATORY_CARE_PROVIDER_SITE_OTHER): Payer: Medicare PPO

## 2021-02-24 ENCOUNTER — Other Ambulatory Visit: Payer: Self-pay

## 2021-02-24 DIAGNOSIS — M722 Plantar fascial fibromatosis: Secondary | ICD-10-CM | POA: Diagnosis not present

## 2021-02-24 MED ORDER — TRIAMCINOLONE ACETONIDE 40 MG/ML IJ SUSP
20.0000 mg | Freq: Once | INTRAMUSCULAR | Status: AC
  Administered 2021-02-24: 20 mg

## 2021-02-24 NOTE — Progress Notes (Signed)
Subjective:  Patient ID: Allison [name], female    DOB: 1951-11-04,  MRN: [id number] HPI Chief Complaint  Patient presents with  . Foot [name]    Plantar heel right - aching x 3 days, no injury, AM [name], active with walking, taking Tylenol and icing  . New Patient (Initial Visit)    70 y.o. female presents with the above complaint.   ROS: Denies fever chills nausea vomiting muscle aches pains calf [name] back [name] chest [name] shortness of breath.  Past Medical History:  Diagnosis Date  . Medical history non-contributory   . Rosacea    Past Surgical History:  Procedure Laterality Date  . BREAST BIOPSY Left 2002   benign  . CATARACT EXTRACTION W/PHACO Right 07/06/2017   Procedure: CATARACT EXTRACTION PHACO AND INTRAOCULAR LENS PLACEMENT (IOC);  Surgeon: Birder Robson, MD;  Location: [facility] ORS;  Service: Ophthalmology;  Laterality: Right;  [place] 00:34.2 AP% 12.7 CDE 4.33 Fluid Pack lot # [id number]  . CATARACT EXTRACTION W/PHACO Left 10/03/2019   Procedure: CATARACT EXTRACTION PHACO AND INTRAOCULAR LENS PLACEMENT ([place]) LEFT;  Surgeon: Birder Robson, MD;  Location: [place];  Service: Ophthalmology;  Laterality: Left;  CDE 6.44 [place] 0:47.3  . COLONOSCOPY WITH PROPOFOL N/A 10/11/2018   Procedure: COLONOSCOPY WITH PROPOFOL;  Surgeon: Lucilla Lame, MD;  Location[facility];  Service: Endoscopy;  Laterality: N/A;    Current Outpatient Medications:  .  Calcium Carb-Cholecalciferol (CALCIUM + D3) 600-200 MG-UNIT TABS, Take 2 tablets by mouth daily. , Disp: , Rfl:  .  cyclobenzaprine (FLEXERIL) 5 MG tablet, Take 1 tablet (5 mg total) by mouth 3 (three) times daily as needed for muscle spasms., Disp: 30 tablet, Rfl: 1 .  ferrous sulfate 325 (65 FE) MG tablet, Take 325 mg by mouth daily with breakfast., Disp: , Rfl:  .  RHOFADE 1 % CREA, APPLY TO FACE QAM, Disp: , Rfl: 11 .  Vitamin D, Ergocalciferol, (DRISDOL) 1.25 MG (50000 UNIT) CAPS capsule, Take 1 capsule (50,000 Units  total) by mouth every 30 (thirty) days., Disp: 12 capsule, Rfl: 0  Allergies  Allergen Reactions  . Strawberry Extract     difficulty breathing  . Tetracycline Rash   Review of Systems Objective:  There were no vitals filed for this visit.  General: Well developed, nourished, in no acute distress, alert and oriented x3   Dermatological: Skin is warm, dry and supple bilateral. Nails x 10 are well maintained; remaining integument appears unremarkable at this time. There are no open sores, no preulcerative lesions, no rash or signs of infection present.  Vascular: Dorsalis Pedis artery and Posterior Tibial artery pedal pulses are 2/4 bilateral with immedate capillary fill time. Pedal hair growth present. No varicosities and no lower extremity edema present bilateral.   Neruologic: Grossly intact via light touch bilateral. Vibratory intact via tuning fork bilateral. Protective threshold with Semmes Wienstein monofilament intact to all pedal sites bilateral. Patellar and Achilles deep tendon reflexes 2+ bilateral. No Babinski or clonus noted bilateral.   Musculoskeletal: No gross boney pedal deformities bilateral. No [name], crepitus, or limitation noted with foot and ankle range of motion bilateral. Muscular strength 5/5 in all groups tested bilateral.  [name] on palpation medial calcaneal tubercle of the right heel.  No [name] on medial lateral compression of the calcaneus.  Gait: Unassisted, Nonantalgic.    Radiographs:  Radiographs taken today demonstrate soft tissue increase in density plantar fascial cannula insertion site of the right heel.  No acute findings are noted in  the heel.  No fractures are identified.  Assessment & Plan:   Assessment: Planter fasciitis right.  Plan: Discussed etiology pathology conservative surgical therapies injected the right heel with 20 mg Kenalog 5 mg Marcaine point maximal tenderness.  Placed on plantar fascial brace.  Start her Medrol Dosepak to be  followed by meloxicam.  Discussed appropriate shoe gear stretching exercises ice therapy and shoe gear modifications.  We will follow-up with her in 1 month.     [name] T. [place], [place]

## 2021-02-24 NOTE — Patient Instructions (Signed)

## 2021-02-25 ENCOUNTER — Telehealth: Payer: Self-pay | Admitting: Podiatry

## 2021-02-25 MED ORDER — METHYLPREDNISOLONE 4 MG PO TBPK: ORAL_TABLET | ORAL | 0 refills | Status: DC

## 2021-02-25 NOTE — Telephone Encounter (Signed)
Patient states that when she called the pharmacy to check the status of her medication, [facility] told her they had not received an order for it. Please advise.

## 2021-02-25 NOTE — Addendum Note (Signed)
Addended by: Clovis Riley E on: 02/25/2021 11:43 AM   Modules accepted: Orders

## 2021-02-25 NOTE — Telephone Encounter (Signed)
Resent into pharmacy

## 2021-02-26 DIAGNOSIS — Z1283 Encounter for screening for malignant neoplasm of skin: Secondary | ICD-10-CM | POA: Diagnosis not present

## 2021-02-26 DIAGNOSIS — D485 Neoplasm of uncertain behavior of skin: Secondary | ICD-10-CM | POA: Diagnosis not present

## 2021-02-26 DIAGNOSIS — D225 Melanocytic nevi of trunk: Secondary | ICD-10-CM | POA: Diagnosis not present

## 2021-03-05 ENCOUNTER — Ambulatory Visit: Payer: Medicare PPO | Admitting: Adult Health

## 2021-04-02 ENCOUNTER — Other Ambulatory Visit: Payer: Self-pay

## 2021-04-02 ENCOUNTER — Encounter: Payer: Self-pay | Admitting: Podiatry

## 2021-04-02 ENCOUNTER — Ambulatory Visit: Payer: Medicare PPO | Admitting: Podiatry

## 2021-04-02 DIAGNOSIS — M722 Plantar fascial fibromatosis: Secondary | ICD-10-CM

## 2021-04-02 MED ORDER — TRIAMCINOLONE ACETONIDE 40 MG/ML IJ SUSP
20.0000 mg | Freq: Once | INTRAMUSCULAR | Status: AC
  Administered 2021-04-02: 20 mg

## 2021-04-02 NOTE — Progress Notes (Signed)
She presents today for follow-up of her Planter fasciitis of her right heel.  States that the sharp pain has gone away 100% continues to wear her plantar fascial brace and she has purchased several pairs of new shoes.  Objective: Vital signs are stable alert oriented x3 pulses are palpable.  No erythema edema cellulitis drainage or odor she does have pain on palpation medial calcaneal tubercle.  She has pain on medial lateral compression of the calcaneus.  Assessment: Bony edema of the calcaneus Planter fasciitis resolving.  Plan: I injected the area once again today with Kenalog and local anesthetic she will continue current therapies follow-up with her in 4 to 6 weeks if necessary.

## 2021-04-10 ENCOUNTER — Other Ambulatory Visit: Payer: Self-pay | Admitting: Podiatry

## 2021-04-10 ENCOUNTER — Encounter: Payer: Self-pay | Admitting: Podiatry

## 2021-04-10 MED ORDER — CYCLOBENZAPRINE HCL 5 MG PO TABS: 5.0000 mg | ORAL_TABLET | Freq: Three times a day (TID) | ORAL | 1 refills | Status: DC | PRN

## 2021-05-01 DIAGNOSIS — H3129 Other hereditary choroidal dystrophy: Secondary | ICD-10-CM | POA: Diagnosis not present

## 2021-05-01 DIAGNOSIS — H18592 Other hereditary corneal dystrophies, left eye: Secondary | ICD-10-CM | POA: Diagnosis not present

## 2021-05-01 DIAGNOSIS — H52222 Regular astigmatism, left eye: Secondary | ICD-10-CM | POA: Diagnosis not present

## 2021-05-01 DIAGNOSIS — D3131 Benign neoplasm of right choroid: Secondary | ICD-10-CM | POA: Diagnosis not present

## 2021-05-01 DIAGNOSIS — H43813 Vitreous degeneration, bilateral: Secondary | ICD-10-CM | POA: Diagnosis not present

## 2021-05-07 ENCOUNTER — Encounter: Payer: Self-pay | Admitting: Podiatry

## 2021-05-07 ENCOUNTER — Ambulatory Visit: Payer: Medicare PPO | Admitting: Podiatry

## 2021-05-07 ENCOUNTER — Other Ambulatory Visit: Payer: Self-pay

## 2021-05-07 DIAGNOSIS — M7671 Peroneal tendinitis, right leg: Secondary | ICD-10-CM | POA: Diagnosis not present

## 2021-05-07 DIAGNOSIS — M722 Plantar fascial fibromatosis: Secondary | ICD-10-CM | POA: Diagnosis not present

## 2021-05-07 MED ORDER — TRIAMCINOLONE ACETONIDE 40 MG/ML IJ SUSP
20.0000 mg | Freq: Once | INTRAMUSCULAR | Status: AC
  Administered 2021-05-07: 20 mg

## 2021-05-07 NOTE — Progress Notes (Addendum)
She presents today states that her Planter fasciitis is flared up again she says she was doing a lot of walking and would like to consider another injection.  Objective: Vitals are stable alert oriented x3.  Pulses are palpable.  The majority of her pain is located over the lateral aspect of the right foot posterior and superior to the lateral malleolus extending below the lateral malleolus to the fifth metatarsal base.   Assessment: Peroneal tendinitis  Plan: Reinjected the right peroneal tendon sheath today with 20 mg Kenalog 5 mg Marcaine point maximal tenderness.  Follow-up with her in 1 month if necessary.

## 2021-05-26 DIAGNOSIS — H6063 Unspecified chronic otitis externa, bilateral: Secondary | ICD-10-CM | POA: Diagnosis not present

## 2021-05-26 DIAGNOSIS — H6123 Impacted cerumen, bilateral: Secondary | ICD-10-CM | POA: Diagnosis not present

## 2021-06-02 DIAGNOSIS — L718 Other rosacea: Secondary | ICD-10-CM | POA: Diagnosis not present

## 2021-06-02 DIAGNOSIS — Z1283 Encounter for screening for malignant neoplasm of skin: Secondary | ICD-10-CM | POA: Diagnosis not present

## 2021-06-02 DIAGNOSIS — D225 Melanocytic nevi of trunk: Secondary | ICD-10-CM | POA: Diagnosis not present

## 2021-06-09 ENCOUNTER — Ambulatory Visit: Payer: Medicare PPO | Admitting: Podiatry

## 2021-06-16 ENCOUNTER — Ambulatory Visit: Payer: Medicare PPO | Admitting: Podiatry

## 2021-06-25 ENCOUNTER — Ambulatory Visit: Payer: Medicare PPO | Admitting: Podiatry

## 2021-06-25 ENCOUNTER — Other Ambulatory Visit: Payer: Self-pay

## 2021-06-25 DIAGNOSIS — M722 Plantar fascial fibromatosis: Secondary | ICD-10-CM

## 2021-06-25 NOTE — Progress Notes (Signed)
She presents today for follow-up of her peroneal tendinitis Planter fasciitis and posterior tibial tendinitis.  States that she is doing just great but wants to know if there is anything that she can do to help prevent this from coming back.  Objective: Vital signs are stable she alert oriented x3 no reproducible pain on palpation today.  Assessment: Resolving posterior tibial tendinitis peroneal tendinitis and Planter fasciitis.  Plan: Instructed her to get started back on her exercise regimen increasing her mileage by quarter mile a week she will do so and I will follow-up with her on an as-needed basis.  We also discussed appropriate shoe gear which she has purchased new shoes and really likes the particular shoe that she has.

## 2021-06-30 ENCOUNTER — Ambulatory Visit: Payer: Medicare PPO | Admitting: Adult Health

## 2021-07-15 ENCOUNTER — Telehealth: Payer: Medicare PPO | Admitting: Physician Assistant

## 2021-07-15 DIAGNOSIS — U071 COVID-19: Secondary | ICD-10-CM

## 2021-07-15 MED ORDER — FLUTICASONE PROPIONATE 50 MCG/ACT NA SUSP: 2.0000 | Freq: Every day | NASAL | 0 refills | Status: DC

## 2021-07-15 MED ORDER — BENZONATATE 100 MG PO CAPS: 100.0000 mg | ORAL_CAPSULE | Freq: Three times a day (TID) | ORAL | 0 refills | Status: DC | PRN

## 2021-07-15 NOTE — Progress Notes (Signed)
Virtual Visit Consent   Allison [name], you are scheduled for a virtual visit with a [facility] provider today.     Just as with appointments in the office, your consent must be obtained to participate.  Your consent will be active for this visit and any virtual visit you may have with one of our providers in the next 365 days.     If you have a MyChart account, a copy of this consent can be sent to you electronically.  All virtual visits are billed to your insurance company just like a traditional visit in the office.    As this is a virtual visit, video technology does not allow for your provider to perform a traditional examination.  This may limit your provider's ability to fully assess your condition.  If your provider identifies any concerns that need to be evaluated in person or the need to arrange testing (such as labs, EKG, etc.), we will make arrangements to do so.     Although advances in technology are sophisticated, we cannot ensure that it will always work on either your end or our end.  If the connection with a video visit is poor, the visit may have to be switched to a telephone visit.  With either a video or telephone visit, we are not always able to ensure that we have a secure connection.     I need to obtain your verbal consent now.   Are you willing to proceed with your visit today?    [name] has provided verbal consent on 07/15/2021 for a virtual visit (video or telephone).   [name], [place]   Date: 07/15/2021 7:06 PM   Virtual Visit via Video Note   I, [name], connected with  Allison [name]  ([id number], [birth year]) on 07/15/21 at  7:00 PM EDT by a video-enabled telemedicine application and verified that I am speaking with the correct person using two identifiers.  Location: Patient: Virtual Visit Location Patient: Home Provider: Virtual Visit Location Provider: Home Office   I discussed the limitations of evaluation and  management by telemedicine and the availability of in person appointments. The patient expressed understanding and agreed to proceed.    History of Present Illness: Allison [name] is a 70 y.o. who identifies as a female who was assigned female at birth, and is being seen today for COVID-19. Notes she tested positive today after having symptoms starting Friday night into Saturday. She notes that she thought was allergy related as they seemed to start after mowing. Notes nasal congestion, PND and scratchy throat. Denies fever, chills, aches, chest congestion or significant cough. Denies chest pain or SOB. Denies GI symptoms. Has had her initial COVID series and 2 boosters.   HPI: HPI  Problems:  Patient Active Problem List   Diagnosis Date Noted   Special screening for malignant neoplasms, colon    Polyp of sigmoid colon    Blood in the urine 08/13/2015   H/O hyperthyroidism 08/13/2015   H/O amblyopia 08/13/2015    Allergies:  Allergies  Allergen Reactions   Strawberry Extract     difficulty breathing   Tetracycline Rash   Medications:  Current Outpatient Medications:    benzonatate (TESSALON) 100 MG capsule, Take 1 capsule (100 mg total) by mouth 3 (three) times daily as needed for cough., Disp: 30 capsule, Rfl: 0   fluticasone (FLONASE) 50 MCG/ACT nasal spray, Place 2 sprays into both nostrils daily., Disp: 16 g,  Rfl: 0   Calcium Carb-Cholecalciferol (CALCIUM + D3) 600-200 MG-UNIT TABS, Take 2 tablets by mouth daily. , Disp: , Rfl:    ferrous sulfate 325 (65 FE) MG tablet, Take 325 mg by mouth daily with breakfast., Disp: , Rfl:    RHOFADE 1 % CREA, APPLY TO FACE QAM, Disp: , Rfl: 11   Vitamin D, Ergocalciferol, (DRISDOL) 1.25 MG (50000 UNIT) CAPS capsule, Take 1 capsule (50,000 Units total) by mouth every 30 (thirty) days., Disp: 12 capsule, Rfl: 0  Observations/Objective: Patient is well-developed, well-nourished in no acute distress.  Resting comfortably at home.  Head is  normocephalic, atraumatic.  No labored breathing. Speech is clear and coherent with logical content.  Patient is alert and oriented at baseline.   Assessment and Plan: 1. COVID-19 - benzonatate (TESSALON) 100 MG capsule; Take 1 capsule (100 mg total) by mouth 3 (three) times daily as needed for cough.  Dispense: 30 capsule; Refill: 0 - MyChart COVID-19 home monitoring program; Future - fluticasone (FLONASE) 50 MCG/ACT nasal spray; Place 2 sprays into both nostrils daily.  Dispense: 16 g; Refill: 0 They are candidate for antiviral but giving only one risk factor and very slight symptoms not a necessity. Discussed antiviral treatments but she declines at present. She is aware must be started within 5 days of symptoms if she changes her mind. Will add on Flonase and Tessalon. Rx sent to pharmacy. Supportive measures, OTC medications and vitamin regimen reviewed. Patient has been enrolled in a MyChart COVID symptom monitoring program. Samule Dry reviewed in detail. Strict ER precautions discussed with patient.    Follow Up Instructions: I discussed the assessment and treatment plan with the patient. The patient was provided an opportunity to ask questions and all were answered. The patient agreed with the plan and demonstrated an understanding of the instructions.  A copy of instructions were sent to the patient via MyChart.  The patient was advised to call back or seek an in-person evaluation if the symptoms worsen or if the condition fails to improve as anticipated.  Time:  I spent 15 minutes with the patient via telehealth technology discussing the above problems/concerns.    [name], PA-C

## 2021-07-15 NOTE — Patient Instructions (Signed)
  [name], thank you for joining [name], PA-C for today's virtual visit.  While this provider is not your primary care provider (PCP), if your PCP is located in our provider database this encounter information will be shared with them immediately following your visit.  Consent: (Patient) Allison [name] provided verbal consent for this virtual visit at the beginning of the encounter.  Current Medications:  Current Outpatient Medications:    Calcium Carb-Cholecalciferol (CALCIUM + D3) 600-200 MG-UNIT TABS, Take 2 tablets by mouth daily. , Disp: , Rfl:    cyclobenzaprine (FLEXERIL) 5 MG tablet, Take 1 tablet (5 mg total) by mouth 3 (three) times daily as needed for muscle spasms., Disp: 30 tablet, Rfl: 1   cyclobenzaprine (FLEXERIL) 5 MG tablet, Take 1 tablet (5 mg total) by mouth 3 (three) times daily as needed for muscle spasms., Disp: 30 tablet, Rfl: 1   ferrous sulfate 325 (65 FE) MG tablet, Take 325 mg by mouth daily with breakfast., Disp: , Rfl:    RHOFADE 1 % CREA, APPLY TO FACE QAM, Disp: , Rfl: 11   Vitamin D, Ergocalciferol, (DRISDOL) 1.25 MG (50000 UNIT) CAPS capsule, Take 1 capsule (50,000 Units total) by mouth every 30 (thirty) days., Disp: 12 capsule, Rfl: 0   Medications ordered in this encounter:  No orders of the defined types were placed in this encounter.    *If you need refills on other medications prior to your next appointment, please contact your pharmacy*  Follow-Up: Call back or seek an in-person evaluation if the symptoms worsen or if the condition fails to improve as anticipated.  Other Instructions Please keep well-hydrated and get plenty of rest. Start a saline nasal rinse to flush out your nasal passages. You can use plain Mucinex to help thin congestion. If you have a humidifier, running in the bedroom at night. I want you to start OTC vitamin D3 1000 units daily, vitamin C 1000 mg daily, and a zinc supplement. Please take prescribed  medications as directed.  You have been enrolled in a MyChart symptom monitoring program. Please answer these questions daily so we can keep track of how you are doing.  You were to quarantine for 5 days from onset of your symptoms.  After day 5, if you have had no fever and you are feeling better, you can end quarantine but need to mask for an additional 5 days. After day 5 if you have a fever or are having significant symptoms, please quarantine for full 10 days.  If you note any worsening of symptoms, any significant shortness of breath or any chest pain, please seek ER evaluation ASAP.  Please do not delay care!   If you have been instructed to have an in-person evaluation today at a local Urgent Care facility, please use the link below. It will take you to a list of all of our available [facility], including address, phone number and hours of operation. Please do not delay care.  [facility]  If you or a family member do not have a primary care provider, use the link below to schedule a visit and establish care. When you choose a [facility] primary care physician or advanced practice provider, you gain a long-term partner in health. Find a Primary Care Provider  Learn more about [facility]'s in-office and virtual care options: [facility]

## 2021-08-02 DIAGNOSIS — Z119 Encounter for screening for infectious and parasitic diseases, unspecified: Secondary | ICD-10-CM | POA: Diagnosis not present

## 2021-08-25 ENCOUNTER — Ambulatory Visit: Payer: Medicare PPO | Admitting: Adult Health

## 2021-09-09 ENCOUNTER — Telehealth: Payer: Medicare PPO | Admitting: Emergency Medicine

## 2021-09-09 DIAGNOSIS — N3000 Acute cystitis without hematuria: Secondary | ICD-10-CM | POA: Diagnosis not present

## 2021-09-09 MED ORDER — CEPHALEXIN 500 MG PO CAPS: 500.0000 mg | ORAL_CAPSULE | Freq: Two times a day (BID) | ORAL | 0 refills | Status: DC

## 2021-09-09 MED ORDER — FLUCONAZOLE 150 MG PO TABS: 150.0000 mg | ORAL_TABLET | Freq: Once | ORAL | 0 refills | Status: AC

## 2021-09-09 NOTE — Patient Instructions (Signed)
  [name], thank you for joining Carvel Getting, NP for today's virtual visit.  While this provider is not your primary care provider (PCP), if your PCP is located in our provider database this encounter information will be shared with them immediately following your visit.  Consent: (Patient) Allison [name] provided verbal consent for this virtual visit at the beginning of the encounter.  Current Medications:  Current Outpatient Medications:    cephALEXin (KEFLEX) 500 MG capsule, Take 1 capsule (500 mg total) by mouth 2 (two) times daily., Disp: 10 capsule, Rfl: 0   fluconazole (DIFLUCAN) 150 MG tablet, Take 1 tablet (150 mg total) by mouth once for 1 dose., Disp: 1 tablet, Rfl: 0   benzonatate (TESSALON) 100 MG capsule, Take 1 capsule (100 mg total) by mouth 3 (three) times daily as needed for cough., Disp: 30 capsule, Rfl: 0   Calcium Carb-Cholecalciferol (CALCIUM + D3) 600-200 MG-UNIT TABS, Take 2 tablets by mouth daily. , Disp: , Rfl:    ferrous sulfate 325 (65 FE) MG tablet, Take 325 mg by mouth daily with breakfast., Disp: , Rfl:    fluticasone (FLONASE) 50 MCG/ACT nasal spray, Place 2 sprays into both nostrils daily., Disp: 16 g, Rfl: 0   RHOFADE 1 % CREA, APPLY TO FACE QAM, Disp: , Rfl: 11   Vitamin D, Ergocalciferol, (DRISDOL) 1.25 MG (50000 UNIT) CAPS capsule, Take 1 capsule (50,000 Units total) by mouth every 30 (thirty) days., Disp: 12 capsule, Rfl: 0   Medications ordered in this encounter:  Meds ordered this encounter  Medications   cephALEXin (KEFLEX) 500 MG capsule    Sig: Take 1 capsule (500 mg total) by mouth 2 (two) times daily.    Dispense:  10 capsule    Refill:  0   fluconazole (DIFLUCAN) 150 MG tablet    Sig: Take 1 tablet (150 mg total) by mouth once for 1 dose.    Dispense:  1 tablet    Refill:  0     *If you need refills on other medications prior to your next appointment, please contact your pharmacy*  Follow-Up: Call back or seek an in-person  evaluation if the symptoms worsen or if the condition fails to improve as anticipated.  Other Instructions Finish all of the antibiotics as prescribed.  Drink plenty of liquids.    If you have been instructed to have an in-person evaluation today at a local Urgent Care facility, please use the link below. It will take you to a list of all of our available [facility], including address, phone number and hours of operation. Please do not delay care.  [facility]  If you or a family member do not have a primary care provider, use the link below to schedule a visit and establish care. When you choose a [facility] primary care physician or advanced practice provider, you gain a long-term partner in health. Find a Primary Care Provider  Learn more about 's in-office and virtual care options: [facility]

## 2021-09-09 NOTE — Progress Notes (Signed)
Virtual Visit Consent   Allison [name], you are scheduled for a virtual visit with a [facility] provider today.     Just as with appointments in the office, your consent must be obtained to participate.  Your consent will be active for this visit and any virtual visit you may have with one of our providers in the next 365 days.     If you have a MyChart account, a copy of this consent can be sent to you electronically.  All virtual visits are billed to your insurance company just like a traditional visit in the office.    As this is a virtual visit, video technology does not allow for your provider to perform a traditional examination.  This may limit your provider's ability to fully assess your condition.  If your provider identifies any concerns that need to be evaluated in person or the need to arrange testing (such as labs, EKG, etc.), we will make arrangements to do so.     Although advances in technology are sophisticated, we cannot ensure that it will always work on either your end or our end.  If the connection with a video visit is poor, the visit may have to be switched to a telephone visit.  With either a video or telephone visit, we are not always able to ensure that we have a secure connection.     I need to obtain your verbal consent now.   Are you willing to proceed with your visit today?    Allison [name] has provided verbal consent on 09/09/2021 for a virtual visit (video or telephone).   Carvel Getting, NP   Date: 09/09/2021 3:24 PM   Virtual Visit via Video Note   I, Carvel Getting, connected with  Allison [name]  ([id number], September 12, 1951) on 09/09/21 at  3:15 PM EDT by a video-enabled telemedicine application and verified that I am speaking with the correct person using two identifiers.  Location: Patient: Virtual Visit Location Patient: Home Provider: Virtual Visit Location Provider: Home Office   I discussed the limitations of evaluation and management by  telemedicine and the availability of in person appointments. The patient expressed understanding and agreed to proceed.    History of Present Illness: Allison [name] is a 70 y.o. who identifies as a female who was assigned female at birth, and is being seen today for urinary tract infection.  Patient reports urinary urgency, burning, foul smell to her urine and cloudy urine with no blood for a week that is progressively worsening.  She denies abdominal pain, nausea, vomiting, fever, flank pain, vaginal discharge.  Was unable to get an appointment with her PCPs office today.  Has not had a UTI in years.  HPI: HPI  Problems:  Patient Active Problem List   Diagnosis Date Noted   Special screening for malignant neoplasms, colon    Polyp of sigmoid colon    Blood in the urine 08/13/2015   H/O hyperthyroidism 08/13/2015   H/O amblyopia 08/13/2015    Allergies:  Allergies  Allergen Reactions   Strawberry Extract     difficulty breathing   Tetracycline Rash   Medications:  Current Outpatient Medications:    cephALEXin (KEFLEX) 500 MG capsule, Take 1 capsule (500 mg total) by mouth 2 (two) times daily., Disp: 10 capsule, Rfl: 0   fluconazole (DIFLUCAN) 150 MG tablet, Take 1 tablet (150 mg total) by mouth once for 1 dose., Disp: 1 tablet, Rfl: 0  benzonatate (TESSALON) 100 MG capsule, Take 1 capsule (100 mg total) by mouth 3 (three) times daily as needed for cough., Disp: 30 capsule, Rfl: 0   Calcium Carb-Cholecalciferol (CALCIUM + D3) 600-200 MG-UNIT TABS, Take 2 tablets by mouth daily. , Disp: , Rfl:    ferrous sulfate 325 (65 FE) MG tablet, Take 325 mg by mouth daily with breakfast., Disp: , Rfl:    fluticasone (FLONASE) 50 MCG/ACT nasal spray, Place 2 sprays into both nostrils daily., Disp: 16 g, Rfl: 0   RHOFADE 1 % CREA, APPLY TO FACE QAM, Disp: , Rfl: 11   Vitamin D, Ergocalciferol, (DRISDOL) 1.25 MG (50000 UNIT) CAPS capsule, Take 1 capsule (50,000 Units total) by mouth every 30  (thirty) days., Disp: 12 capsule, Rfl: 0  Observations/Objective: Patient is well-developed, well-nourished in no acute distress.  Resting comfortably  at home.  Head is normocephalic, atraumatic.  No labored breathing.  Speech is clear and coherent with logical content.  Patient is alert and oriented at baseline.    Assessment and Plan: 1. Acute cystitis without hematuria  Symptoms consistent with urinary tract infection.  Patient is also worried about a yeast infection from antibiotics, so I prescribed her Keflex to treat the UTI and fluconazole to address any yeast infection.  Follow Up Instructions: I discussed the assessment and treatment plan with the patient. The patient was provided an opportunity to ask questions and all were answered. The patient agreed with the plan and demonstrated an understanding of the instructions.  A copy of instructions were sent to the patient via MyChart unless otherwise noted below.   The patient was advised to call back or seek an in-person evaluation if the symptoms worsen or if the condition fails to improve as anticipated.  Time:  I spent 10 minutes with the patient via telehealth technology discussing the above problems/concerns.    Carvel Getting, NP

## 2021-09-24 ENCOUNTER — Encounter: Payer: Medicare PPO | Admitting: Family Medicine

## 2021-09-25 ENCOUNTER — Other Ambulatory Visit: Payer: Self-pay

## 2021-09-25 ENCOUNTER — Ambulatory Visit (INDEPENDENT_AMBULATORY_CARE_PROVIDER_SITE_OTHER): Payer: Medicare PPO | Admitting: Family Medicine

## 2021-09-25 ENCOUNTER — Encounter: Payer: Self-pay | Admitting: Family Medicine

## 2021-09-25 VITALS — BP 128/69 | HR 73 | Resp 16 | Ht 69.0 in | Wt 200.9 lb

## 2021-09-25 DIAGNOSIS — M8580 Other specified disorders of bone density and structure, unspecified site: Secondary | ICD-10-CM

## 2021-09-25 DIAGNOSIS — E782 Mixed hyperlipidemia: Secondary | ICD-10-CM | POA: Insufficient documentation

## 2021-09-25 DIAGNOSIS — E7801 Familial hypercholesterolemia: Secondary | ICD-10-CM

## 2021-09-25 DIAGNOSIS — K1379 Other lesions of oral mucosa: Secondary | ICD-10-CM

## 2021-09-25 DIAGNOSIS — R770 Abnormality of albumin: Secondary | ICD-10-CM

## 2021-09-25 DIAGNOSIS — R7303 Prediabetes: Secondary | ICD-10-CM

## 2021-09-25 DIAGNOSIS — T85848S Pain due to other internal prosthetic devices, implants and grafts, sequela: Secondary | ICD-10-CM | POA: Diagnosis not present

## 2021-09-25 DIAGNOSIS — N898 Other specified noninflammatory disorders of vagina: Secondary | ICD-10-CM | POA: Diagnosis not present

## 2021-09-25 DIAGNOSIS — Z23 Encounter for immunization: Secondary | ICD-10-CM

## 2021-09-25 DIAGNOSIS — Z8052 Family history of malignant neoplasm of bladder: Secondary | ICD-10-CM | POA: Diagnosis not present

## 2021-09-25 DIAGNOSIS — Z0000 Encounter for general adult medical examination without abnormal findings: Secondary | ICD-10-CM

## 2021-09-25 DIAGNOSIS — R300 Dysuria: Secondary | ICD-10-CM | POA: Diagnosis not present

## 2021-09-25 DIAGNOSIS — E559 Vitamin D deficiency, unspecified: Secondary | ICD-10-CM | POA: Diagnosis not present

## 2021-09-25 DIAGNOSIS — T85848A Pain due to other internal prosthetic devices, implants and grafts, initial encounter: Secondary | ICD-10-CM | POA: Insufficient documentation

## 2021-09-25 DIAGNOSIS — Z803 Family history of malignant neoplasm of breast: Secondary | ICD-10-CM | POA: Diagnosis not present

## 2021-09-25 DIAGNOSIS — R3915 Urgency of urination: Secondary | ICD-10-CM

## 2021-09-25 LAB — POCT URINALYSIS DIPSTICK
Bilirubin, UA: NEGATIVE
Blood, UA: NEGATIVE
Glucose, UA: NEGATIVE
Ketones, UA: NEGATIVE
Leukocytes, UA: NEGATIVE
Nitrite, UA: NEGATIVE
Protein, UA: NEGATIVE
Spec Grav, UA: <=1.005 — AB (ref 1.010–1.025)
Urobilinogen, UA: 0.2 E.U./dL
pH, UA: 8 (ref 5.0–8.0)

## 2021-09-25 MED ORDER — TRIAMCINOLONE ACETONIDE 0.5 % EX OINT: 1.0000 "application " | TOPICAL_OINTMENT | Freq: Two times a day (BID) | CUTANEOUS | 0 refills | Status: DC

## 2021-09-25 NOTE — Assessment & Plan Note (Signed)
Sister; died in 59's

## 2021-09-25 NOTE — Assessment & Plan Note (Signed)
provided ?

## 2021-09-25 NOTE — Assessment & Plan Note (Signed)
Small canker sore noted in back buccal space of R cheek Pt to contact DDS given recent dental implant No gross abscess noted

## 2021-09-25 NOTE — Assessment & Plan Note (Signed)
Repeat a1c

## 2021-09-25 NOTE — Assessment & Plan Note (Signed)
UTD on dental/eye Things to do to keep yourself healthy  - Exercise at least 30-45 minutes a day, 3-4 days a week.  - Eat a low-fat diet with lots of fruits and vegetables, up to 7-9 servings per day.  - Seatbelts can save your life. Wear them always.  - Smoke detectors on every level of your home, check batteries every year.  - Eye Doctor - have an eye exam every 1-2 years  - Safe sex - if you may be exposed to STDs, use a condom.  - Alcohol -  If you drink, do it moderately, less than 2 drinks per day.  - [place]. Choose someone to speak for you if you are not able.  - Depression is common in our stressful world.If you're feeling down or losing interest in things you normally enjoy, please come in for a visit.  - Violence - If anyone is threatening or hurting you, please call immediately.

## 2021-09-25 NOTE — Assessment & Plan Note (Signed)
Repeat chemistry

## 2021-09-25 NOTE — Progress Notes (Signed)
Complete physical exam   Patient: Allison [name]   DOB: 1951-04-01   70 y.o. Female  MRN: [id number] Visit Date: 09/25/2021  Today's healthcare provider: Gwyneth Sprout, FNP   Chief Complaint  Patient presents with   Annual Exam   Subjective     HPI  Allison [name] is a 70 y.o. female who presents today for a complete physical exam.  She reports consuming a general diet. Home exercise routine includes walking 2 miles in morning. She generally feels fairly well. She reports sleeping fairly well. She does have additional problems to discuss today, patient reports dysuria since 10/25. Patient states that she was prescribed antibiotic to help treat UTI but patient reports that symptoms have returned.   Past Medical History:  Diagnosis Date   Medical history non-contributory    Rosacea    Past Surgical History:  Procedure Laterality Date   BREAST BIOPSY Left 2002   benign   CATARACT EXTRACTION W/PHACO Right 07/06/2017   Procedure: CATARACT EXTRACTION PHACO AND INTRAOCULAR LENS PLACEMENT ([place]);  Surgeon: Birder Robson, MD;  Location: [facility] ORS;  Service: Ophthalmology;  Laterality: Right;  [place] 00:34.2 AP% 12.7 CDE 4.33 Fluid Pack lot # [id number] H   CATARACT EXTRACTION W/PHACO Left 10/03/2019   Procedure: CATARACT EXTRACTION PHACO AND INTRAOCULAR LENS PLACEMENT ([place]) LEFT;  Surgeon: Birder Robson, MD;  Location: [place];  Service: Ophthalmology;  Laterality: Left;  CDE 6.44 [place] 0:47.3   COLONOSCOPY WITH PROPOFOL N/A 10/11/2018   Procedure: COLONOSCOPY WITH PROPOFOL;  Surgeon: Lucilla Lame, MD;  Location: [facility] ENDOSCOPY;  Service: Endoscopy;  Laterality: N/A;   Social History   Socioeconomic History   Marital status: Married    Spouse name: Not on file   Number of children: 0   Years of education: Not on file   Highest education level: Master's degree (e.g., MA, MS, MEng, MEd, MSW, MBA)  Occupational History   Occupation: Armed forces operational officer  Tobacco Use    Smoking status: Never   Smokeless tobacco: Never  Vaping Use   Vaping Use: Never used  Substance and Sexual Activity   Alcohol use: Yes    Alcohol/week: 14.0 standard drinks    Types: 14 Glasses of wine per week    Comment: 2 a day   Drug use: No   Sexual activity: Not on file  Other Topics Concern   Not on file  Social History Narrative   Not on file   Social Determinants of Health   Financial Resource Strain: Not on file  Food Insecurity: Not on file  Transportation Needs: Not on file  Physical Activity: Not on file  Stress: Not on file  Social Connections: Not on file  Intimate Partner Violence: Not on file   Family Status  Relation Name Status   Mother  Deceased at age 16       bladder cancer   Father  Deceased at age 26       CVA   Sister  Deceased at age 53       breast cancer   Brother  Alive   Brother  Alive   Family History  Problem Relation Age of Onset   Bladder Cancer Mother    Breast cancer Mother    Stroke Father    Hyperlipidemia Father    Breast cancer Sister 60   Kidney Stones Brother    Hyperlipidemia Brother    Kidney Stones Brother    Diverticulosis Brother    Allergies  Allergen Reactions   Strawberry Extract     difficulty breathing   Tetracycline Rash    Patient Care Team: Gwyneth Sprout, FNP as PCP - General (Family Medicine) Carloyn Manner, MD as Referring Physician (Otolaryngology) Cleaster Corin, OD (Optometry)   Medications: Outpatient Medications Prior to Visit  Medication Sig   Calcium Carb-Cholecalciferol (CALCIUM + D3) 600-200 MG-UNIT TABS Take 2 tablets by mouth daily.    ferrous sulfate 325 (65 FE) MG tablet Take 325 mg by mouth daily with breakfast.   RHOFADE 1 % CREA APPLY TO FACE QAM   Vitamin D, Ergocalciferol, (DRISDOL) 1.25 MG (50000 UNIT) CAPS capsule Take 1 capsule (50,000 Units total) by mouth every 30 (thirty) days.   [DISCONTINUED] benzonatate (TESSALON) 100 MG capsule Take 1 capsule (100 mg total) by  mouth 3 (three) times daily as needed for cough.   [DISCONTINUED] cephALEXin (KEFLEX) 500 MG capsule Take 1 capsule (500 mg total) by mouth 2 (two) times daily.   [DISCONTINUED] fluticasone (FLONASE) 50 MCG/ACT nasal spray Place 2 sprays into both nostrils daily. (Patient not taking: Reported on 09/25/2021)   No facility-administered medications prior to visit.    Review of Systems  HENT:  Positive for mouth sores.   Genitourinary:  Positive for dysuria, enuresis and urgency.  All other systems reviewed and are negative.    Objective    BP 128/69   Pulse 73   Resp 16   Ht 5\' 9"  (1.753 m)   Wt 200 lb 14.4 oz (91.1 kg)   SpO2 100%   BMI 29.67 kg/m    Physical Exam Vitals and nursing note reviewed.  Constitutional:      General: She is awake. She is not in acute distress.    Appearance: Normal appearance. She is well-developed, well-groomed and overweight. She is not ill-appearing, toxic-appearing or diaphoretic.  HENT:     Head: Normocephalic and atraumatic.     Jaw: There is normal jaw occlusion. No trismus, tenderness, swelling or pain on movement.     Right Ear: Hearing, ear canal and external ear normal. There is impacted cerumen.     Left Ear: Hearing, ear canal and external ear normal. There is impacted cerumen.     Ears:     Comments: Unable to visualize Tms; pt aware, does not wish to have cleaned in office Denies complaints    Nose: Nose normal. No congestion or rhinorrhea.     Right Turbinates: Not enlarged, swollen or pale.     Left Turbinates: Not enlarged, swollen or pale.     Right Sinus: No maxillary sinus tenderness or frontal sinus tenderness.     Left Sinus: No maxillary sinus tenderness or frontal sinus tenderness.     Mouth/Throat:     Lips: Pink.     Mouth: Mucous membranes are moist. No injury.     Tongue: No lesions.     Pharynx: Oropharynx is clear. Uvula midline. No pharyngeal swelling, oropharyngeal exudate, posterior oropharyngeal erythema or  uvula swelling.     Tonsils: No tonsillar exudate or tonsillar abscesses.     Comments: Complaint R cheek R back lower mouth "sore" Eyes:     General: Lids are normal. Lids are everted, no foreign bodies appreciated. Vision grossly intact. Gaze aligned appropriately. No allergic shiner or visual field deficit.       Right eye: No discharge.        Left eye: No discharge.     Extraocular Movements: Extraocular movements intact.  Conjunctiva/sclera: Conjunctivae normal.     Right eye: Right conjunctiva is not injected. No exudate.    Left eye: Left conjunctiva is not injected. No exudate.    Pupils: Pupils are equal, round, and reactive to light.  Neck:     Thyroid: No thyroid mass, thyromegaly or thyroid tenderness.     Vascular: No carotid bruit.     Trachea: Trachea normal.  Cardiovascular:     Rate and Rhythm: Normal rate and regular rhythm.     Pulses: Normal pulses.          Carotid pulses are 2+ on the right side and 2+ on the left side.      Radial pulses are 2+ on the right side and 2+ on the left side.       Dorsalis pedis pulses are 2+ on the right side and 2+ on the left side.       Posterior tibial pulses are 2+ on the right side and 2+ on the left side.     Heart sounds: Normal heart sounds, S1 normal and S2 normal. No murmur heard.   No friction rub. No gallop.  Pulmonary:     Effort: Pulmonary effort is normal. No respiratory distress.     Breath sounds: Normal breath sounds and air entry. No stridor. No wheezing, rhonchi or rales.  Chest:     Chest wall: No tenderness.     Comments: Breast exam deferred; discussed 'know your lemons' campaign and self exam Abdominal:     General: Abdomen is flat. Bowel sounds are normal. There is no distension.     Palpations: Abdomen is soft. There is no mass.     Tenderness: There is no abdominal tenderness. There is no right CVA tenderness, left CVA tenderness, guarding or rebound.     Hernia: No hernia is present.   Genitourinary:    Comments: Exam deferred; complaints of frequency, leaking, incomplete emptying Musculoskeletal:        General: No swelling, tenderness, deformity or signs of injury. Normal range of motion.     Cervical back: Full passive range of motion without pain, normal range of motion and neck supple. No edema, rigidity or tenderness. No muscular tenderness.     Right lower leg: No edema.     Left lower leg: No edema.  Lymphadenopathy:     Cervical: No cervical adenopathy.     Right cervical: No superficial, deep or posterior cervical adenopathy.    Left cervical: No superficial, deep or posterior cervical adenopathy.  Skin:    General: Skin is warm and dry.     Capillary Refill: Capillary refill takes less than 2 seconds.     Coloration: Skin is not jaundiced or pale.     Findings: No bruising, erythema, lesion or rash.  Neurological:     General: No focal deficit present.     Mental Status: She is alert and oriented to person, place, and time. Mental status is at baseline.     GCS: GCS eye subscore is 4. GCS verbal subscore is 5. GCS motor subscore is 6.     Sensory: Sensation is intact. No sensory deficit.     Motor: Motor function is intact. No weakness.     Coordination: Coordination is intact. Coordination normal.     Gait: Gait is intact. Gait normal.  Psychiatric:        Attention and Perception: Attention and perception normal.        Mood and Affect:  Mood and affect normal.        Speech: Speech normal.        Behavior: Behavior normal. Behavior is cooperative.        Thought Content: Thought content normal.        Cognition and Memory: Cognition and memory normal.        Judgment: Judgment normal.      Last depression screening scores PHQ 2/9 Scores 09/23/2020 09/21/2019 08/17/2018  PHQ - 2 Score 0 0 0   Last fall risk screening Fall Risk  09/23/2020  Falls in the past year? 0  Number falls in past yr: 0  Injury with Fall? 0   Last Audit-C alcohol use  screening Alcohol Use Disorder Test (AUDIT) 09/23/2020  1. How often do you have a drink containing alcohol? 4  2. How many drinks containing alcohol do you have on a typical day when you are drinking? 0  3. How often do you have six or more drinks on one occasion? 0  AUDIT-C Score 4  4. How often during the last year have you found that you were not able to stop drinking once you had started? 0  5. How often during the last year have you failed to do what was normally expected from you because of drinking? 0  6. How often during the last year have you needed a first drink in the morning to get yourself going after a heavy drinking session? 0  7. How often during the last year have you had a feeling of guilt of remorse after drinking? 0  8. How often during the last year have you been unable to remember what happened the night before because you had been drinking? 0  9. Have you or someone else been injured as a result of your drinking? 0  10. Has a relative or friend or a doctor or another health worker been concerned about your drinking or suggested you cut down? 0  Alcohol Use Disorder Identification Test Final Score (AUDIT) 4   A score of 3 or more in women, and 4 or more in men indicates increased risk for alcohol abuse, EXCEPT if all of the points are from question 1   Results for orders placed or performed in visit on 09/25/21  POCT urinalysis dipstick  Result Value Ref Range   Color, UA yellow    Clarity, UA clear    Glucose, UA Negative Negative   Bilirubin, UA negative    Ketones, UA negative    Spec Grav, UA <=1.005 (A) 1.010 - 1.025   Blood, UA negative    pH, UA 8.0 5.0 - 8.0   Protein, UA Negative Negative   Urobilinogen, UA 0.2 0.2 or 1.0 E.U./dL   Nitrite, UA negative    Leukocytes, UA Negative Negative   Appearance     Odor      Assessment & Plan    Routine Health Maintenance and Physical Exam  Exercise Activities and Dietary recommendations  Goals      DIET  - INCREASE WATER INTAKE     Recommend increasing water intake to 6-8 8 oz glasses a day.         Immunization History  Administered Date(s) Administered   Fluad Quad(high Dose 65+) 07/26/2019, 09/25/2021   Influenza, High Dose Seasonal PF 08/23/2017, 08/17/2018, 08/05/2020   Influenza,inj,Quad PF,6+ Mos 08/08/2014, 08/14/2015, 08/14/2016   PFIZER(Purple Top)SARS-COV-2 Vaccination 12/15/2019, 01/05/2020, 06/29/2020   Pneumococcal Conjugate-13 08/23/2017   Pneumococcal Polysaccharide-23 08/08/2014,  09/29/2019   Td 02/14/2005   Zoster Recombinat (Shingrix) 08/23/2017, 11/05/2017   Zoster, Live 12/18/2011    Health Maintenance  Topic Date Due   TETANUS/TDAP  02/15/2015   COVID-19 Vaccine (4 - Booster for Pfizer series) 08/24/2020   DEXA SCAN  11/06/2021   MAMMOGRAM  11/07/2022   COLONOSCOPY (Pts 45-70yrs Insurance coverage will need to be confirmed)  10/11/2028   Pneumonia Vaccine 92+ Years old  Completed   INFLUENZA VACCINE  Completed   Hepatitis C Screening  Completed   Zoster Vaccines- Shingrix  Completed   HPV VACCINES  Aged Out    Discussed health benefits of physical activity, and encouraged her to engage in regular exercise appropriate for her age and condition.  Problem List Items Addressed This Visit       Musculoskeletal and Integument   Osteopenia    Repeat DEXA      Relevant Orders   DG Bone Density     Genitourinary   Vaginal dryness    Trial of topical steroid      Relevant Medications   triamcinolone ointment (KENALOG) 0.5 %   Other Relevant Orders   Ambulatory referral to Urology     Other   Need for influenza vaccination    provided      Relevant Orders   Flu Vaccine QUAD High Dose(Fluad) (Completed)   Dysuria    UA clean; slight dilution      Relevant Orders   POCT urinalysis dipstick (Completed)   Ambulatory referral to Urology   Urine Culture   Dental implant pain    2 months since process; recommend reach out to DDS for ongoing  concerns      LDL (low density lipoprotein receptor disorder)    Repeat lipid panel       Relevant Orders   Lipid panel   Mouth sores    Small canker sore noted in back buccal space of R cheek Pt to contact DDS given recent dental implant No gross abscess noted      Urinary urgency    No UTI; dilution present Will sent for Cx Plan to refer to urology      Relevant Orders   Ambulatory referral to Urology   Vitamin D deficiency    Repeat blood work; aware cost may be associated with lab      Relevant Orders   Vitamin D (25 hydroxy)   Prediabetes    Repeat a1c       Relevant Orders   Comprehensive metabolic panel   Hemoglobin A1c   Family history of breast cancer    Sister; died in 71's      Relevant Orders   MM 3D SCREEN BREAST BILATERAL   Family history of bladder cancer    Mom died in 78s      Relevant Orders   Ambulatory referral to Urology   High serum albumin    Repeat chemistry      Relevant Orders   CBC with Differential/Platelet   Annual physical exam - Primary    UTD on dental/eye Things to do to keep yourself healthy  - Exercise at least 30-45 minutes a day, 3-4 days a week.  - Eat a low-fat diet with lots of fruits and vegetables, up to 7-9 servings per day.  - Seatbelts can save your life. Wear them always.  - Smoke detectors on every level of your home, check batteries every year.  - Eye Doctor - have an eye exam every  1-2 years  - Safe sex - if you may be exposed to STDs, use a condom.  - Alcohol -  If you drink, do it moderately, less than 2 drinks per day.  - [place]. Choose someone to speak for you if you are not able.  - Depression is common in our stressful world.If you're feeling down or losing interest in things you normally enjoy, please come in for a visit.  - Violence - If anyone is threatening or hurting you, please call immediately.          Return in about 1 year (around 09/25/2022) for annual  examination.     Vonna Kotyk, FNP, have reviewed all documentation for this visit. The documentation on 09/25/21 for the exam, diagnosis, procedures, and orders are all accurate and complete.    Gwyneth Sprout, [place] [phone] (phone) [phone] (fax)  [place]

## 2021-09-25 NOTE — Assessment & Plan Note (Signed)
No UTI; dilution present Will sent for Cx Plan to refer to urology

## 2021-09-25 NOTE — Assessment & Plan Note (Signed)
Mom died in 70s

## 2021-09-25 NOTE — Assessment & Plan Note (Signed)
Repeat blood work; aware cost may be associated with lab

## 2021-09-25 NOTE — Assessment & Plan Note (Signed)
Repeat DEXA. 

## 2021-09-25 NOTE — Assessment & Plan Note (Signed)
UA clean; slight dilution

## 2021-09-25 NOTE — Assessment & Plan Note (Signed)
2 months since process; recommend reach out to DDS for ongoing concerns

## 2021-09-25 NOTE — Assessment & Plan Note (Signed)
Trial of topical steroid

## 2021-09-25 NOTE — Assessment & Plan Note (Signed)
Repeat lipid panel ?

## 2021-09-26 ENCOUNTER — Encounter: Payer: Self-pay | Admitting: Family Medicine

## 2021-09-26 ENCOUNTER — Other Ambulatory Visit: Payer: Self-pay | Admitting: Family Medicine

## 2021-09-26 DIAGNOSIS — Z8639 Personal history of other endocrine, nutritional and metabolic disease: Secondary | ICD-10-CM

## 2021-09-26 DIAGNOSIS — R7303 Prediabetes: Secondary | ICD-10-CM

## 2021-09-26 DIAGNOSIS — E7801 Familial hypercholesterolemia: Secondary | ICD-10-CM

## 2021-09-26 LAB — COMPREHENSIVE METABOLIC PANEL
ALT: 16 IU/L (ref 0–32)
AST: 20 IU/L (ref 0–40)
Albumin/Globulin Ratio: 2.3 — ABNORMAL HIGH (ref 1.2–2.2)
Albumin: 4.6 g/dL (ref 3.8–4.8)
Alkaline Phosphatase: 65 IU/L (ref 44–121)
BUN/Creatinine Ratio: 15 (ref 12–28)
BUN: 10 mg/dL (ref 8–27)
Bilirubin Total: 0.5 mg/dL (ref 0.0–1.2)
CO2: 25 mmol/L (ref 20–29)
Calcium: 9.3 mg/dL (ref 8.7–10.3)
Chloride: 104 mmol/L (ref 96–106)
Creatinine, Ser: 0.67 mg/dL (ref 0.57–1.00)
Globulin, Total: 2 g/dL (ref 1.5–4.5)
Glucose: 89 mg/dL (ref 70–99)
Potassium: 4.8 mmol/L (ref 3.5–5.2)
Sodium: 141 mmol/L (ref 134–144)
Total Protein: 6.6 g/dL (ref 6.0–8.5)
eGFR: 95 mL/min/{1.73_m2} (ref >59)

## 2021-09-26 LAB — LIPID PANEL
Chol/HDL Ratio: 2.7 ratio (ref 0.0–4.4)
Cholesterol, Total: 201 mg/dL — ABNORMAL HIGH (ref 100–199)
HDL: 74 mg/dL (ref >39)
LDL Chol Calc (NIH): 119 mg/dL — ABNORMAL HIGH (ref 0–99)
Triglycerides: 41 mg/dL (ref 0–149)
VLDL Cholesterol Cal: 8 mg/dL (ref 5–40)

## 2021-09-26 LAB — CBC WITH DIFFERENTIAL/PLATELET
Basophils Absolute: 0 10*3/uL (ref 0.0–0.2)
Basos: 1 %
EOS (ABSOLUTE): 0.2 10*3/uL (ref 0.0–0.4)
Eos: 4 %
Hematocrit: 36.8 % (ref 34.0–46.6)
Hemoglobin: 12.2 g/dL (ref 11.1–15.9)
Immature Grans (Abs): 0 10*3/uL (ref 0.0–0.1)
Immature Granulocytes: 0 %
Lymphocytes Absolute: 1.2 10*3/uL (ref 0.7–3.1)
Lymphs: 27 %
MCH: 30.3 pg (ref 26.6–33.0)
MCHC: 33.2 g/dL (ref 31.5–35.7)
MCV: 91 fL (ref 79–97)
Monocytes Absolute: 0.4 10*3/uL (ref 0.1–0.9)
Monocytes: 9 %
Neutrophils Absolute: 2.5 10*3/uL (ref 1.4–7.0)
Neutrophils: 59 %
Platelets: 338 10*3/uL (ref 150–450)
RBC: 4.03 x10E6/uL (ref 3.77–5.28)
RDW: 13.1 % (ref 11.7–15.4)
WBC: 4.3 10*3/uL (ref 3.4–10.8)

## 2021-09-26 LAB — HEMOGLOBIN A1C
Est. average glucose Bld gHb Est-mCnc: 126 mg/dL
Hgb A1c MFr Bld: 6 % — ABNORMAL HIGH (ref 4.8–5.6)

## 2021-09-26 LAB — VITAMIN D 25 HYDROXY (VIT D DEFICIENCY, FRACTURES): Vit D, 25-Hydroxy: 35.3 ng/mL (ref 30.0–100.0)

## 2021-09-29 ENCOUNTER — Encounter: Payer: Medicare PPO | Admitting: Physician Assistant

## 2021-09-30 ENCOUNTER — Other Ambulatory Visit: Payer: Self-pay | Admitting: Family Medicine

## 2021-09-30 MED ORDER — VITAMIN D (ERGOCALCIFEROL) 1.25 MG (50000 UNIT) PO CAPS: 50000.0000 [IU] | ORAL_CAPSULE | ORAL | 3 refills | Status: DC

## 2021-10-01 LAB — URINE CULTURE

## 2021-10-01 NOTE — Progress Notes (Signed)
Call patient; poor sampling.  Unable to detect urinary infection/source of infection given lack of clean sample.

## 2021-10-27 ENCOUNTER — Ambulatory Visit: Payer: Medicare PPO | Admitting: Urology

## 2021-10-27 ENCOUNTER — Other Ambulatory Visit: Payer: Self-pay

## 2021-10-27 ENCOUNTER — Encounter: Payer: Self-pay | Admitting: Urology

## 2021-10-27 VITALS — BP 182/78 | HR 81 | Ht 69.0 in | Wt 200.0 lb

## 2021-10-27 DIAGNOSIS — R3915 Urgency of urination: Secondary | ICD-10-CM

## 2021-10-27 LAB — URINALYSIS, COMPLETE
Bilirubin, UA: NEGATIVE
Glucose, UA: NEGATIVE
Ketones, UA: NEGATIVE
Leukocytes,UA: NEGATIVE
Nitrite, UA: NEGATIVE
Protein,UA: NEGATIVE
Specific Gravity, UA: 1.02 (ref 1.005–1.030)
Urobilinogen, Ur: 0.2 mg/dL (ref 0.2–1.0)
pH, UA: 7 (ref 5.0–7.5)

## 2021-10-27 LAB — MICROSCOPIC EXAMINATION
Bacteria, UA: NONE SEEN
Epithelial Cells (non renal): NONE SEEN /hpf (ref 0–10)

## 2021-10-27 MED ORDER — MIRABEGRON ER 50 MG PO TB24: 50.0000 mg | ORAL_TABLET | Freq: Every day | ORAL | 11 refills | Status: DC

## 2021-10-27 NOTE — Progress Notes (Signed)
10/27/2021 9:09 AM   Allison [name] 20-Nov-1950 [id number]  Referring provider: Gwyneth Sprout, [place],  [place] [address]  Chief Complaint  Patient presents with   Urinary Urgency    HPI: I was consulted to assess the patient's urge and bladder.  She describes her recent urinary tract infection and the symptoms subsided when treated.  She has urge incontinence but is not daily.  She does not wear a pad.  No bedwetting or stress incontinence.  I think her urgency is little bit worse since the infection but she had a prior but has decreased some of her excessive fluid intake  She can void every 1 or 2 hours and sometimes gets up once a night.  Flow was good  Recent urine culture in medical record was negative.  No hysterectomy  No history of kidney stones bladder surgery or recurrent bladder infections.  No neurologic issues.  Bowel function normal   PMH: Past Medical History:  Diagnosis Date   Medical history non-contributory    Rosacea     Surgical History: Past Surgical History:  Procedure Laterality Date   BREAST BIOPSY Left 2002   benign   CATARACT EXTRACTION W/PHACO Right 07/06/2017   Procedure: CATARACT EXTRACTION PHACO AND INTRAOCULAR LENS PLACEMENT ([place]);  Surgeon: Birder Robson, MD;  Location: [facility] ORS;  Service: Ophthalmology;  Laterality: Right;  [place] 00:34.2 AP% 12.7 CDE 4.33 Fluid Pack lot # [id number] H   CATARACT EXTRACTION W/PHACO Left 10/03/2019   Procedure: CATARACT EXTRACTION PHACO AND INTRAOCULAR LENS PLACEMENT ([place]) LEFT;  Surgeon: Birder Robson, MD;  Location: [place];  Service: Ophthalmology;  Laterality: Left;  CDE 6.44 [place] 0:47.3   COLONOSCOPY WITH PROPOFOL N/A 10/11/2018   Procedure: COLONOSCOPY WITH PROPOFOL;  Surgeon: Lucilla Lame, MD;  Location: [facility] ENDOSCOPY;  Service: Endoscopy;  Laterality: N/A;    Home Medications:  Allergies as of 10/27/2021       Reactions   Strawberry Extract    difficulty  breathing   Tetracycline Rash        Medication List        Accurate as of October 27, 2021  9:09 AM. If you have any questions, ask your nurse or doctor.          Calcium + D3 600-200 MG-UNIT Tabs Take 2 tablets by mouth daily.   ferrous sulfate 325 (65 FE) MG tablet Take 325 mg by mouth daily with breakfast.   Rhofade 1 % Crea Generic drug: Oxymetazoline HCl APPLY TO FACE QAM   triamcinolone ointment 0.5 % Commonly known as: KENALOG Apply 1 application topically 2 (two) times daily.   Vitamin D (Ergocalciferol) 1.25 MG (50000 UNIT) Caps capsule Commonly known as: DRISDOL Take 1 capsule (50,000 Units total) by mouth every 7 (seven) days.        Allergies:  Allergies  Allergen Reactions   Strawberry Extract     difficulty breathing   Tetracycline Rash    Family History: Family History  Problem Relation Age of Onset   Bladder Cancer Mother    Breast cancer Mother    Stroke Father    Hyperlipidemia Father    Breast cancer Sister 65   Kidney Stones Brother    Hyperlipidemia Brother    Kidney Stones Brother    Diverticulosis Brother     Social History:  reports that she has never smoked. She has never used smokeless tobacco. She reports current alcohol use of about 14.0 standard drinks per week.  She reports that she does not use drugs.  ROS:                                        Physical Exam: There were no vitals taken for this visit.  Constitutional:  Alert and oriented, No acute distress. HEENT: [place] AT, moist mucus membranes.  Trachea midline, no masses. Cardiovascular: No clubbing, cyanosis, or edema. Respiratory: Normal respiratory effort, no increased work of breathing. GI: Abdomen is soft, nontender, nondistended, no abdominal masses GU: Mild narrowing of introitus.  No cystocele or stress incontinence. Skin: No rashes, bruises or suspicious lesions. Lymph: No cervical or inguinal adenopathy. Neurologic: Grossly  intact, no focal deficits, moving all 4 extremities. Psychiatric: Normal mood and affect.  Laboratory Data: Lab Results  Component Value Date   WBC 4.3 09/25/2021   HGB 12.2 09/25/2021   HCT 36.8 09/25/2021   MCV 91 09/25/2021   PLT 338 09/25/2021    Lab Results  Component Value Date   CREATININE 0.67 09/25/2021    No results found for: PSA  No results found for: TESTOSTERONE  Lab Results  Component Value Date   HGBA1C 6.0 (H) 09/25/2021    Urinalysis    Component Value Date/Time   BILIRUBINUR negative 09/25/2021 1012   PROTEINUR Negative 09/25/2021 1012   UROBILINOGEN 0.2 09/25/2021 1012   NITRITE negative 09/25/2021 1012   LEUKOCYTESUR Negative 09/25/2021 1012    Pertinent Imaging: Urine reviewed.  Urine sent for culture.  Chart reviewed.  Post void residual 7 mL  Assessment & Plan: Treatment of mild overactive bladder discussed.  Even though her symptoms are mild she want to see if medicine would help her.  Reassess in 6 weeks on Myrbetriq 50 mg samples and prescription.  We did not consult physical therapy but she may be open to it  There are no diagnoses linked to this encounter.  No follow-ups on file.  Reece Packer, MD  [facility] [address], [place], [place] [address] [phone]

## 2021-10-31 LAB — CULTURE, URINE COMPREHENSIVE

## 2021-11-11 ENCOUNTER — Other Ambulatory Visit: Payer: Medicare PPO

## 2021-11-11 ENCOUNTER — Ambulatory Visit: Payer: Medicare PPO

## 2021-11-14 DIAGNOSIS — Z823 Family history of stroke: Secondary | ICD-10-CM | POA: Diagnosis not present

## 2021-11-14 DIAGNOSIS — L719 Rosacea, unspecified: Secondary | ICD-10-CM | POA: Diagnosis not present

## 2021-11-14 DIAGNOSIS — Z8249 Family history of ischemic heart disease and other diseases of the circulatory system: Secondary | ICD-10-CM | POA: Diagnosis not present

## 2021-11-14 DIAGNOSIS — Z803 Family history of malignant neoplasm of breast: Secondary | ICD-10-CM | POA: Diagnosis not present

## 2021-11-14 DIAGNOSIS — M5430 Sciatica, unspecified side: Secondary | ICD-10-CM | POA: Diagnosis not present

## 2021-11-14 DIAGNOSIS — Z20822 Contact with and (suspected) exposure to covid-19: Secondary | ICD-10-CM | POA: Diagnosis not present

## 2021-11-14 DIAGNOSIS — G8929 Other chronic pain: Secondary | ICD-10-CM | POA: Diagnosis not present

## 2021-11-14 DIAGNOSIS — J309 Allergic rhinitis, unspecified: Secondary | ICD-10-CM | POA: Diagnosis not present

## 2021-11-14 DIAGNOSIS — R32 Unspecified urinary incontinence: Secondary | ICD-10-CM | POA: Diagnosis not present

## 2021-11-20 ENCOUNTER — Telehealth: Payer: Medicare PPO | Admitting: Physician Assistant

## 2021-11-20 DIAGNOSIS — M5442 Lumbago with sciatica, left side: Secondary | ICD-10-CM

## 2021-11-20 MED ORDER — CYCLOBENZAPRINE HCL 10 MG PO TABS: 10.0000 mg | ORAL_TABLET | Freq: Three times a day (TID) | ORAL | 0 refills | Status: DC | PRN

## 2021-11-20 MED ORDER — NAPROXEN 500 MG PO TABS: 500.0000 mg | ORAL_TABLET | Freq: Two times a day (BID) | ORAL | 0 refills | Status: DC

## 2021-11-20 NOTE — Progress Notes (Signed)

## 2021-11-20 NOTE — Progress Notes (Signed)
I have spent 5 minutes in review of e-visit questionnaire, review and updating patient chart, medical decision making and response to patient.   Allison [name] Allison Kayson Bullis, PA-C    

## 2021-11-24 ENCOUNTER — Encounter: Payer: Self-pay | Admitting: Family Medicine

## 2021-11-24 ENCOUNTER — Other Ambulatory Visit: Payer: Self-pay

## 2021-11-24 ENCOUNTER — Ambulatory Visit: Payer: Medicare PPO | Admitting: Podiatry

## 2021-11-24 ENCOUNTER — Ambulatory Visit
Admission: RE | Admit: 2021-11-24 | Discharge: 2021-11-24 | Disposition: A | Discharge status: Home / Self Care | Payer: Medicare PPO | Source: Ambulatory Visit | Attending: Family Medicine | Admitting: Family Medicine

## 2021-11-24 DIAGNOSIS — Z1231 Encounter for screening mammogram for malignant neoplasm of breast: Secondary | ICD-10-CM | POA: Insufficient documentation

## 2021-11-24 DIAGNOSIS — Z803 Family history of malignant neoplasm of breast: Secondary | ICD-10-CM | POA: Insufficient documentation

## 2021-11-24 DIAGNOSIS — M85851 Other specified disorders of bone density and structure, right thigh: Secondary | ICD-10-CM | POA: Insufficient documentation

## 2021-11-24 DIAGNOSIS — Z13820 Encounter for screening for osteoporosis: Secondary | ICD-10-CM | POA: Insufficient documentation

## 2021-11-24 DIAGNOSIS — Z780 Asymptomatic menopausal state: Secondary | ICD-10-CM | POA: Diagnosis not present

## 2021-11-24 DIAGNOSIS — M8580 Other specified disorders of bone density and structure, unspecified site: Secondary | ICD-10-CM

## 2021-12-15 ENCOUNTER — Ambulatory Visit: Payer: Medicare PPO | Admitting: Urology

## 2021-12-18 ENCOUNTER — Ambulatory Visit: Payer: Medicare PPO | Admitting: Podiatry

## 2021-12-18 ENCOUNTER — Other Ambulatory Visit: Payer: Self-pay

## 2021-12-18 DIAGNOSIS — M76821 Posterior tibial tendinitis, right leg: Secondary | ICD-10-CM

## 2021-12-18 DIAGNOSIS — Q666 Other congenital valgus deformities of feet: Secondary | ICD-10-CM

## 2021-12-18 DIAGNOSIS — H6063 Unspecified chronic otitis externa, bilateral: Secondary | ICD-10-CM | POA: Diagnosis not present

## 2021-12-18 DIAGNOSIS — H6123 Impacted cerumen, bilateral: Secondary | ICD-10-CM | POA: Diagnosis not present

## 2021-12-18 DIAGNOSIS — R1314 Dysphagia, pharyngoesophageal phase: Secondary | ICD-10-CM | POA: Diagnosis not present

## 2021-12-18 NOTE — Progress Notes (Signed)
Subjective:  Patient ID: Allison Allison [name], female    DOB: [birth year],  MRN: [id number]  Chief Complaint  Patient presents with   Plantar Fasciitis    Pt stated that she is having some Allison [name] in her right foot     71 y.o. female presents with the above complaint.  Patient presents with continuous Allison [name] to the right medial foot.  Patient states this started back up again.  She was treated by Dr. Milinda Pointer back in August however has progressed to gotten worse again.  She states that it started to cause her sore when she is ambulating.  She has been wearing her shoes and insoles as well.  She has not seen anyone else prior to seeing me or Dr. Milinda Pointer.  She would like to discuss treatment options for this.  She does not wear any current bracing.   Review of Systems: Negative except as noted in the HPI. Denies N/V/F/Ch.  Past Medical History:  Diagnosis Date   Medical history non-contributory    Rosacea     Current Outpatient Medications:    Calcium Carb-Cholecalciferol (CALCIUM + D3) 600-200 MG-UNIT TABS, Take 2 tablets by mouth daily. , Disp: , Rfl:    cyclobenzaprine (FLEXERIL) 10 MG tablet, Take 1 tablet (10 mg total) by mouth 3 (three) times daily as needed for muscle spasms., Disp: 15 tablet, Rfl: 0   ferrous sulfate 325 (65 FE) MG tablet, Take 325 mg by mouth daily with breakfast., Disp: , Rfl:    mirabegron ER (MYRBETRIQ) 50 MG TB24 tablet, Take 1 tablet (50 mg total) by mouth daily., Disp: 30 tablet, Rfl: 11   naproxen (NAPROSYN) 500 MG tablet, Take 1 tablet (500 mg total) by mouth 2 (two) times daily with a meal., Disp: 20 tablet, Rfl: 0   RHOFADE 1 % CREA, APPLY TO FACE QAM, Disp: , Rfl: 11   triamcinolone ointment (KENALOG) 0.5 %, Apply 1 application topically 2 (two) times daily., Disp: 30 g, Rfl: 0   Vitamin D, Ergocalciferol, (DRISDOL) 1.25 MG (50000 UNIT) CAPS capsule, Take 1 capsule (50,000 Units total) by mouth every 7 (seven) days., Disp: 13 capsule, Rfl: 3  Social History    Tobacco Use  Smoking Status Never  Smokeless Tobacco Never    Allergies  Allergen Reactions   Strawberry Extract     difficulty breathing   Tetracycline Rash   Objective:  There were no vitals filed for this visit. There is no height or weight on file to calculate BMI. Constitutional Well developed. Well nourished.  Vascular Dorsalis pedis pulses palpable bilaterally. Posterior tibial pulses palpable bilaterally. Capillary refill normal to all digits.  No cyanosis or clubbing noted. Pedal hair growth normal.  Neurologic Normal speech. Oriented to person, place, and time. Epicritic sensation to light touch grossly present bilaterally.  Dermatologic Nails well groomed and normal in appearance. No open wounds. No skin lesions.  Orthopedic: Allison [name] on palpation along the course of the posterior tibial tendon including the insertion.  Pes planovalgus foot structure noted with calcaneovalgus to many toe signs unable to recreate the arch with dorsiflexion of the hallux.  Unable to perform single and double heel raise.  No Allison [name] at the ATFL ligament, Achilles tendon.  Mild Allison [name] at the peroneal tendon along the course of it likely due to compensation.   Radiographs: None Assessment:   1. Posterior tibial tendinitis, right   2. Pes planovalgus    Plan:  Patient was evaluated and treated and all questions answered.  Right  posterior tibial tendinitis with underlying pes planovalgus -I explained to the patient the etiology of tendinitis and various treatment options were extensively discussed.  Given the amount of Allison [name] that she is having I believe she will benefit from a steroid injection help decrease acute inflammatory component associate with Allison [name].  I discussed with her given that this is near the tendon the risk of rupture associated with it.  She states understanding to proceed with despite the risks -A steroid injection was performed at right medial foot at point of maximal  tenderness using 1% plain Lidocaine and 10 mg of Kenalog. This was well tolerated. -Tri-Lock ankle brace was dispensed for stability   No follow-ups on file.

## 2022-01-15 ENCOUNTER — Ambulatory Visit: Payer: Medicare PPO | Admitting: Podiatry

## 2022-01-15 ENCOUNTER — Other Ambulatory Visit: Payer: Self-pay

## 2022-01-15 DIAGNOSIS — M76821 Posterior tibial tendinitis, right leg: Secondary | ICD-10-CM

## 2022-01-15 NOTE — Progress Notes (Signed)
?Subjective:  ?Patient ID: Allison [name], female    DOB: 1951/04/18,  MRN: [id number] ? ?Chief Complaint  ?Patient presents with  ? Foot Pain  ?  Right foot pain ?Pt stated that her foot is much better  ? ? ?71 y.o. female presents with the above complaint.  Patient presents for follow-up of right posterior tibial tendinitis.  She states she is doing a lot better the steroid injection helped considerably.  She was able to walk regularly without any pain.  She is been wearing good shoes.  She would like to know what kind of insoles to get. ? ? ?Review of Systems: Negative except as noted in the HPI. Denies N/V/F/Ch. ? ?Past Medical History:  ?Diagnosis Date  ? Medical history non-contributory   ? Rosacea   ? ? ?Current Outpatient Medications:  ?  Calcium Carb-Cholecalciferol (CALCIUM + D3) 600-200 MG-UNIT TABS, Take 2 tablets by mouth daily. , Disp: , Rfl:  ?  cyclobenzaprine (FLEXERIL) 10 MG tablet, Take 1 tablet (10 mg total) by mouth 3 (three) times daily as needed for muscle spasms., Disp: 15 tablet, Rfl: 0 ?  ferrous sulfate 325 (65 FE) MG tablet, Take 325 mg by mouth daily with breakfast., Disp: , Rfl:  ?  mirabegron ER (MYRBETRIQ) 50 MG TB24 tablet, Take 1 tablet (50 mg total) by mouth daily., Disp: 30 tablet, Rfl: 11 ?  naproxen (NAPROSYN) 500 MG tablet, Take 1 tablet (500 mg total) by mouth 2 (two) times daily with a meal., Disp: 20 tablet, Rfl: 0 ?  RHOFADE 1 % CREA, APPLY TO FACE QAM, Disp: , Rfl: 11 ?  triamcinolone ointment (KENALOG) 0.5 %, Apply 1 application topically 2 (two) times daily., Disp: 30 g, Rfl: 0 ?  Vitamin D, Ergocalciferol, (DRISDOL) 1.25 MG (50000 UNIT) CAPS capsule, Take 1 capsule (50,000 Units total) by mouth every 7 (seven) days., Disp: 13 capsule, Rfl: 3 ? ?Social History  ? ?Tobacco Use  ?Smoking Status Never  ?Smokeless Tobacco Never  ? ? ?Allergies  ?Allergen Reactions  ? Strawberry Extract   ?  difficulty breathing  ? Tetracycline Rash  ? ?Objective:  ?There were no vitals  filed for this visit. ?There is no height or weight on file to calculate BMI. ?Constitutional Well developed. ?Well nourished.  ?Vascular Dorsalis pedis pulses palpable bilaterally. ?Posterior tibial pulses palpable bilaterally. ?Capillary refill normal to all digits.  ?No cyanosis or clubbing noted. ?Pedal hair growth normal.  ?Neurologic Normal speech. ?Oriented to person, place, and time. ?Epicritic sensation to light touch grossly present bilaterally.  ?Dermatologic Nails well groomed and normal in appearance. ?No open wounds. ?No skin lesions.  ?Orthopedic: No further pain on palpation along the course of the posterior tibial tendon including the insertion.  Pes planovalgus foot structure noted with calcaneovalgus to many toe signs unable to recreate the arch with dorsiflexion of the hallux.  Unable to perform single and double heel raise.  No pain at the ATFL ligament, Achilles tendon.  Mild pain at the peroneal tendon along the course of it likely due to compensation.  ? ?Radiographs: None ?Assessment:  ? ?No diagnosis found. ? ?Plan:  ?Patient was evaluated and treated and all questions answered. ? ?Right posterior tibial tendinitis with underlying pes planovalgus ?-Clinically healed with a steroid injection and cam boot immobilization.  At this time I discussed that she can wear Tri-Lock brace as needed and she can transition out of it.  I also discussed the importance of power steps and shoe  gear modification.  She states understanding will obtain that.  If any foot and ankle issues arise in future of asked her to come back and see me. ? ?No follow-ups on file.  ?

## 2022-02-20 ENCOUNTER — Telehealth: Payer: Self-pay

## 2022-02-20 NOTE — Telephone Encounter (Signed)
Copied from [place] [id number]. Topic: General - Other ?>> Feb 20, 2022 10:04 AM McGill, Nelva Bush wrote: ?Reason for CRM: Pt stated she has been a pt with [facility] for many years and she would like her PCP [name], PA . ?Pt stated she does not want her PCP to be a NP. ?Per pt request, please assign [name], PA as her PCP. ?

## 2022-02-24 DIAGNOSIS — Z1283 Encounter for screening for malignant neoplasm of skin: Secondary | ICD-10-CM | POA: Diagnosis not present

## 2022-02-24 DIAGNOSIS — D225 Melanocytic nevi of trunk: Secondary | ICD-10-CM | POA: Diagnosis not present

## 2022-04-16 ENCOUNTER — Ambulatory Visit (INDEPENDENT_AMBULATORY_CARE_PROVIDER_SITE_OTHER): Payer: Medicare PPO

## 2022-04-16 VITALS — Wt 200.0 lb

## 2022-04-16 DIAGNOSIS — Z0000 Encounter for general adult medical examination without abnormal findings: Secondary | ICD-10-CM | POA: Diagnosis not present

## 2022-04-16 NOTE — Progress Notes (Signed)
Virtual Visit via Telephone Note  I connected with  Allison [name] on 04/16/22 at  8:15 AM EDT by telephone and verified that I am speaking with the correct person using two identifiers.  Location: Patient: home Provider: [facility] Persons participating in the virtual visit: [place]   I discussed the limitations, risks, security and privacy concerns of performing an evaluation and management service by telephone and the availability of in person appointments. The patient expressed understanding and agreed to proceed.  Interactive audio and video telecommunications were attempted between this nurse and patient, however failed, due to patient having technical difficulties OR patient did not have access to video capability.  We continued and completed visit with audio only.  Some vital signs may be absent or patient reported.   Dionisio David, LPN  Subjective:   Allison [name] is a 71 y.o. female who presents for Medicare Annual (Subsequent) preventive examination.  Review of Systems     Cardiac Risk Factors include: advanced age (>9mn, >>35women)     Objective:    There were no vitals filed for this visit. There is no height or weight on file to calculate BMI.     04/16/2022    8:24 AM 09/23/2020   10:06 AM 10/03/2019    6:57 AM 09/21/2019    1:22 PM 10/11/2018    7:20 AM 08/17/2018    9:05 AM 11/25/2017   10:01 AM  Advanced Directives  Does Patient Have a Medical Advance Directive? No Yes Yes Yes Yes Yes Yes  Type of ASocial research officer, governmentLiving will HSan [place]Living will HA[place]Living will HLenoir CityLiving will HPlymouthLiving will HHannibalLiving will;Out of facility DNR (pink MOST or yellow form)  Does patient want to make changes to medical advance directive?   No - Guardian declined    No - Patient declined  Copy of [place] Chart?  No - copy requested No - copy requested No - copy requested  No - copy requested   Would patient like information on creating a medical advance directive? No - Patient declined          Current Medications (verified) Outpatient Encounter Medications as of 04/16/2022  Medication Sig   Calcium Carb-Cholecalciferol (CALCIUM + D3) 600-200 MG-UNIT TABS Take 2 tablets by mouth daily.    ferrous sulfate 325 (65 FE) MG tablet Take 325 mg by mouth daily with breakfast.   Vitamin D, Ergocalciferol, (DRISDOL) 1.25 MG (50000 UNIT) CAPS capsule Take 1 capsule (50,000 Units total) by mouth every 7 (seven) days.   cyclobenzaprine (FLEXERIL) 10 MG tablet Take 1 tablet (10 mg total) by mouth 3 (three) times daily as needed for muscle spasms. (Patient not taking: Reported on 04/16/2022)   mirabegron ER (MYRBETRIQ) 50 MG TB24 tablet Take 1 tablet (50 mg total) by mouth daily. (Patient not taking: Reported on 04/16/2022)   naproxen (NAPROSYN) 500 MG tablet Take 1 tablet (500 mg total) by mouth 2 (two) times daily with a meal. (Patient not taking: Reported on 04/16/2022)   RHOFADE 1 % CREA APPLY TO FACE QAM (Patient not taking: Reported on 04/16/2022)   triamcinolone ointment (KENALOG) 0.5 % Apply 1 application topically 2 (two) times daily. (Patient not taking: Reported on 04/16/2022)   No facility-administered encounter medications on file as of 04/16/2022.    Allergies (verified) Strawberry extract and Tetracycline   History: Past  Medical History:  Diagnosis Date   Medical history non-contributory    Rosacea    Past Surgical History:  Procedure Laterality Date   BREAST BIOPSY Left 2002   benign   CATARACT EXTRACTION W/PHACO Right 07/06/2017   Procedure: CATARACT EXTRACTION PHACO AND INTRAOCULAR LENS PLACEMENT ([place]);  Surgeon: Birder Robson, MD;  Location: [facility] ORS;  Service: Ophthalmology;  Laterality: Right;  [place] 00:34.2 AP% 12.7 CDE 4.33 Fluid Pack lot # [id number]   CATARACT  EXTRACTION W/PHACO Left 10/03/2019   Procedure: CATARACT EXTRACTION PHACO AND INTRAOCULAR LENS PLACEMENT ([place]) LEFT;  Surgeon: Birder Robson, MD;  Location: [facility];  Service: Ophthalmology;  Laterality: Left;  CDE 6.44 [place] 0:47.3   COLONOSCOPY WITH PROPOFOL N/A 10/11/2018   Procedure: COLONOSCOPY WITH PROPOFOL;  Surgeon: Lucilla Lame, MD;  Location: [facility] END[facility]COPY;  Service: Endoscopy;  Laterality: N/A;   Family History  Problem Relation Age of Onset   Bladder Cancer Mother    Breast cancer Mother    Stroke Father    Hyperlipidemia Father    Breast cancer Sister 32   Kidney Stones Brother    Hyperlipidemia Brother    Kidney Stones Brother    Diverticulosis Brother    Social History   Socioeconomic History   Marital status: Married    Spouse name: Not on file   Number of children: 0   Years of education: Not on file   Highest education level: Master's degree (e.g., MA, MS, MEng, MEd, MSW, MBA)  Occupational History   Occupation: Armed forces operational officer  Tobacco Use   Smoking status: Never   Smokeless tobacco: Never  Vaping Use   Vaping Use: Never used  Substance and Sexual Activity   Alcohol use: Yes    Alcohol/week: 14.0 standard drinks    Types: 14 Glasses of wine per week    Comment: 2 a day   Drug use: No   Sexual activity: Not on file  Other Topics Concern   Not on file  Social History Narrative   Not on file   Social Determinants of Health   Financial Resource Strain: Low Risk    Difficulty of Paying Living Expenses: Not hard at all  Food Insecurity: No Food Insecurity   Worried About Charity fundraiser in the Last Year: Never true   [place] in the Last Year: Never true  Transportation Needs: No Transportation Needs   Lack of Transportation (Medical): No   Lack of Transportation (Non-Medical): No  Physical Activity: Sufficiently Active   Days of Exercise per Week: 5 days   Minutes of Exercise per Session: 60 min  Stress: No Stress Concern  Present   Feeling of Stress : Only a little  Social Connections: Moderately Integrated   Frequency of Communication with Friends and Family: More than three times a week   Frequency of Social Gatherings with Friends and Family: Once a week   Attends Religious Services: Never   Marine scientist or Organizations: Yes   Attends Music therapist: More than 4 times per year   Marital Status: Married    Tobacco Counseling Counseling given: Not Answered   Clinical Intake:  Pre-visit preparation completed: Yes  Pain : No/denies pain     Diabetes: No  How often do you need to have someone help you when you read instructions, pamphlets, or other written materials from your doctor or pharmacy?: 1 - Never  Diabetic?no  Interpreter Needed?: No  Information entered by ::  Kirke Shaggy, LPN   Activities of Daily Living    04/16/2022    8:25 AM  In your present state of health, do you have any difficulty performing the following activities:  Hearing? 0  Vision? 0  Difficulty concentrating or making decisions? 0  Walking or climbing stairs? 0  Dressing or bathing? 0  Doing errands, shopping? 0  Preparing Food and eating ? N  Using the Toilet? N  In the past six months, have you accidently leaked urine? N  Do you have problems with loss of bowel control? N  Managing your Medications? N  Managing your Finances? N  Housekeeping or managing your Housekeeping? N    Patient Care Team: [name], PA-C as PCP - General (Physician Assistant) Carloyn Manner, MD as Referring Physician (Otolaryngology) Cleaster Corin, OD (Optometry)  Indicate any recent Medical Services you may have received from other than [facility] providers in the past year (date may be approximate).     Assessment:   This is a routine wellness examination for Allison [name].  Hearing/Vision screen No results found.  Dietary issues and exercise activities discussed: Current Exercise Habits: Home  exercise routine, Type of exercise: walking, Time (Minutes): 60, Frequency (Times/Week): 5, Weekly Exercise (Minutes/Week): 300, Intensity: Mild   Goals Addressed             This Visit's Progress    DIET - EAT MORE FRUITS AND VEGETABLES         Depression Screen    04/16/2022    8:21 AM 09/23/2020   10:04 AM 09/21/2019    1:23 PM 08/17/2018    9:04 AM 08/23/2017    2:07 PM 08/14/2015    9:08 AM  PHQ 2/9 Scores  PHQ - 2 Score 0 0 0 0 0 0  PHQ- 9 Score 0         Fall Risk    04/16/2022    8:25 AM 09/23/2020   10:06 AM 09/21/2019    1:27 PM 09/21/2019    1:23 PM 08/17/2018    9:04 AM  [place] in the past year? 0 0 0 0 No  Number falls in past yr: 0 0 0 0   Injury with Fall? 0 0 0 0   Risk for fall due to : No Fall Risks      Follow up Falls evaluation completed        FALL RISK PREVENTION PERTAINING TO THE HOME:  Any stairs in or around the home? Yes  If so, are there any without handrails? No  Home free of loose throw rugs in walkways, pet beds, electrical cords, etc? Yes  Adequate lighting in your home to reduce risk of falls? Yes   ASSISTIVE DEVICES UTILIZED TO PREVENT FALLS:  Life alert? No  Use of a cane, walker or w/c? No  Grab bars in the bathroom? No  Shower chair or bench in shower? No  Elevated toilet seat or a handicapped toilet? Yes     Cognitive Function:        04/16/2022    8:27 AM 09/23/2020   10:09 AM 09/21/2019    1:27 PM 08/17/2018    9:09 AM  6CIT Screen  What Year? 0 points 0 points 0 points 0 points  What month? 0 points 0 points 0 points 0 points  What time? 0 points 0 points 0 points 0 points  Count back from 20 0 points 0 points 0 points 0 points  Months  in reverse 0 points 0 points 0 points 0 points  Repeat phrase 0 points 2 points 2 points 2 points  Total Score 0 points 2 points 2 points 2 points    Immunizations Immunization History  Administered Date(s) Administered   Fluad Quad(high Dose 65+) 07/26/2019, 09/25/2021    Influenza, High Dose Seasonal PF 08/23/2017, 08/17/2018, 08/05/2020   Influenza,inj,Quad PF,6+ Mos 08/08/2014, 08/14/2015, 08/14/2016   PFIZER(Purple Top)SARS-COV-2 Vaccination 12/15/2019, 01/05/2020, 06/29/2020   Pneumococcal Conjugate-13 08/23/2017   Pneumococcal Polysaccharide-23 08/08/2014, 09/29/2019   Td 02/14/2005   Zoster Recombinat (Shingrix) 08/23/2017, 11/05/2017   Zoster, Live 12/18/2011    TDAP status: Due, Education has been provided regarding the importance of this vaccine. Advised may receive this vaccine at local pharmacy or Health Dept. Aware to provide a copy of the vaccination record if obtained from local pharmacy or Health Dept. Verbalized acceptance and understanding.  Flu Vaccine status: Up to date  Pneumococcal vaccine status: Up to date  Covid-19 vaccine status: Completed vaccines  Qualifies for Shingles Vaccine? Yes   Zostavax completed Yes   Shingrix Completed?: Yes  Screening Tests Health Maintenance  Topic Date Due   TETANUS/TDAP  02/15/2015   COVID-19 Vaccine (4 - Booster for Pfizer series) 08/24/2020   INFLUENZA VACCINE  06/16/2022   MAMMOGRAM  11/25/2023   DEXA SCAN  11/25/2023   COLONOSCOPY (Pts 45-62yr Insurance coverage will need to be confirmed)  10/11/2028   Pneumonia Vaccine 70 Years old  Completed   Hepatitis C Screening  Completed   Zoster Vaccines- Shingrix  Completed   HPV VACCINES  Aged Out    Health Maintenance  Health Maintenance Due  Topic Date Due   TETANUS/TDAP  02/15/2015   COVID-19 Vaccine (4 - Booster for Pfizer series) 08/24/2020    Colorectal cancer screening: Type of screening: Colonoscopy. Completed 10/11/18. Repeat every 10 years  Mammogram status: Completed 11/24/21. Repeat every year  Bone Density status: Completed 11/24/21. Results reflect: Bone density results: OSTEOPOROSIS. Repeat every 2 years.  Lung Cancer Screening: (Low Dose CT Chest recommended if Age 70-80years, 30 pack-year currently smoking OR  have quit w/in 15years.) does not qualify.   Additional Screening:  Hepatitis C Screening: does qualify; Completed 08/14/16  Vision Screening: Recommended annual ophthalmology exams for early detection of glaucoma and other disorders of the eye. Is the patient up to date with their annual eye exam?  Yes  Who is the provider or what is the name of the [facility] in which the patient attends annual [facility] exams[facility] is not established with a provider, would they like to be referred to a provider to establish care? No .   Dental Screening: Recommended annual dental exams for proper oral hygiene  Community Resource Referral / Chronic Care Management: CRR required this visit?  No   CCM required this visit?  No      Plan:     I have personally reviewed and noted the following in the patient's chart:   Medical and social history Use of alcohol, tobacco or illicit drugs  Current medications and supplements including opioid prescriptions.  Functional ability and status Nutritional status Physical activity Advanced directives List of other physicians Hospitalizations, surgeries, and ER visits in previous 12 months Vitals Screenings to include cognitive, depression, and falls Referrals and appointments  In addition, I have reviewed and discussed with patient certain preventive protocols, quality metrics, and best practice recommendations. A written personalized care plan for preventive services as well  as general preventive health recommendations were provided to patient.     Dionisio David, LPN   03/24/7330   Nurse Notes: none

## 2022-04-16 NOTE — Patient Instructions (Signed)
[name] , Thank you for taking time to come for your Medicare Wellness Visit. I appreciate your ongoing commitment to your health goals. Please review the following plan we discussed and let me know if I can assist you in the future.   Screening recommendations/referrals: Colonoscopy: 10/11/18 Mammogram: 11/24/21 Bone Density: 11/24/21 Recommended yearly ophthalmology/optometry visit for glaucoma screening and checkup Recommended yearly dental visit for hygiene and checkup  Vaccinations: Influenza vaccine: 09/25/21 Pneumococcal vaccine: 09/29/19 Tdap vaccine: 02/14/05, due Shingles vaccine: Shingrix 08/23/17, 11/05/17  Zostavax 12/18/11   Covid-19:12/15/19, 01/05/20, 06/29/20  Advanced directives: no  Conditions/risks identified: none  Next appointment: Follow up in one year for your annual wellness visit 04/19/23 @ 8:15 am by phone   Preventive Care 65 Years and Older, Female Preventive care refers to lifestyle choices and visits with your health care provider that can promote health and wellness. What does preventive care include? A yearly physical exam. This is also called an annual well check. Dental exams once or twice a year. Routine eye exams. Ask your health care provider how often you should have your eyes checked. Personal lifestyle choices, including: Daily care of your teeth and gums. Regular physical activity. Eating a healthy diet. Avoiding tobacco and drug use. Limiting alcohol use. Practicing safe sex. Taking low-dose aspirin every day. Taking vitamin and mineral supplements as recommended by your health care provider. What happens during an annual well check? The services and screenings done by your health care provider during your annual well check will depend on your age, overall health, lifestyle risk factors, and family history of disease. Counseling  Your health care provider may ask you questions about your: Alcohol use. Tobacco use. Drug use. Emotional  well-being. Home and relationship well-being. Sexual activity. Eating habits. History of falls. Memory and ability to understand (cognition). Work and work Statistician. Reproductive health. Screening  You may have the following tests or measurements: Height, weight, and BMI. Blood pressure. Lipid and cholesterol levels. These may be checked every 5 years, or more frequently if you are over 17 years old. Skin check. Lung cancer screening. You may have this screening every year starting at age 47 if you have a 30-pack-year history of smoking and currently smoke or have quit within the past 15 years. Fecal occult blood test (FOBT) of the stool. You may have this test every year starting at age 28. Flexible sigmoidoscopy or colonoscopy. You may have a sigmoidoscopy every 5 years or a colonoscopy every 10 years starting at age 19. Hepatitis C blood test. Hepatitis B blood test. Sexually transmitted disease (STD) testing. Diabetes screening. This is done by checking your blood sugar (glucose) after you have not eaten for a while (fasting). You may have this done every 1-3 years. Bone density scan. This is done to screen for osteoporosis. You may have this done starting at age 67. Mammogram. This may be done every 1-2 years. Talk to your health care provider about how often you should have regular mammograms. Talk with your health care provider about your test results, treatment options, and if necessary, the need for more tests. Vaccines  Your health care provider may recommend certain vaccines, such as: Influenza vaccine. This is recommended every year. Tetanus, diphtheria, and acellular pertussis (Tdap, Td) vaccine. You may need a Td booster every 10 years. Zoster vaccine. You may need this after age 22. Pneumococcal 13-valent conjugate (PCV13) vaccine. One dose is recommended after age 79. Pneumococcal polysaccharide (PPSV23) vaccine. One dose is recommended after  age 2. Talk to your  health care provider about which screenings and vaccines you need and how often you need them. This information is not intended to replace advice given to you by your health care provider. Make sure you discuss any questions you have with your health care provider. Document Released: 11/29/2015 Document Revised: 07/22/2016 Document Reviewed: 09/03/2015 Elsevier Interactive Patient Education  2017 [place] Prevention in the Home Falls can cause injuries. They can happen to people of all ages. There are many things you can do to make your home safe and to help prevent falls. What can I do on the outside of my home? Regularly fix the edges of walkways and driveways and fix any cracks. Remove anything that might make you trip as you walk through a door, such as a raised step or threshold. Trim any bushes or trees on the path to your home. Use bright outdoor lighting. Clear any walking paths of anything that might make someone trip, such as rocks or tools. Regularly check to see if handrails are loose or broken. Make sure that both sides of any steps have handrails. Any raised decks and porches should have guardrails on the edges. Have any leaves, snow, or ice cleared regularly. Use sand or salt on walking paths during winter. Clean up any spills in your garage right away. This includes oil or grease spills. What can I do in the bathroom? Use night lights. Install grab bars by the toilet and in the tub and shower. Do not use towel bars as grab bars. Use non-skid mats or decals in the tub or shower. If you need to sit down in the shower, use a plastic, non-slip stool. Keep the floor dry. Clean up any water that spills on the floor as soon as it happens. Remove soap buildup in the tub or shower regularly. Attach bath mats securely with double-sided non-slip rug tape. Do not have throw rugs and other things on the floor that can make you trip. What can I do in the bedroom? Use night  lights. Make sure that you have a light by your bed that is easy to reach. Do not use any sheets or blankets that are too big for your bed. They should not hang down onto the floor. Have a firm chair that has side arms. You can use this for support while you get dressed. Do not have throw rugs and other things on the floor that can make you trip. What can I do in the kitchen? Clean up any spills right away. Avoid walking on wet floors. Keep items that you use a lot in easy-to-reach places. If you need to reach something above you, use a strong step stool that has a grab bar. Keep electrical cords out of the way. Do not use floor polish or wax that makes floors slippery. If you must use wax, use non-skid floor wax. Do not have throw rugs and other things on the floor that can make you trip. What can I do with my stairs? Do not leave any items on the stairs. Make sure that there are handrails on both sides of the stairs and use them. Fix handrails that are broken or loose. Make sure that handrails are as long as the stairways. Check any carpeting to make sure that it is firmly attached to the stairs. Fix any carpet that is loose or worn. Avoid having throw rugs at the top or bottom of the stairs. If you do  have throw rugs, attach them to the floor with carpet tape. Make sure that you have a light switch at the top of the stairs and the bottom of the stairs. If you do not have them, ask someone to add them for you. What else can I do to help prevent falls? Wear shoes that: Do not have high heels. Have rubber bottoms. Are comfortable and fit you well. Are closed at the toe. Do not wear sandals. If you use a stepladder: Make sure that it is fully opened. Do not climb a closed stepladder. Make sure that both sides of the stepladder are locked into place. Ask someone to hold it for you, if possible. Clearly mark and make sure that you can see: Any grab bars or handrails. First and last  steps. Where the edge of each step is. Use tools that help you move around (mobility aids) if they are needed. These include: Canes. Walkers. Scooters. Crutches. Turn on the lights when you go into a dark area. Replace any light bulbs as soon as they burn out. Set up your furniture so you have a clear path. Avoid moving your furniture around. If any of your floors are uneven, fix them. If there are any pets around you, be aware of where they are. Review your medicines with your doctor. Some medicines can make you feel dizzy. This can increase your chance of falling. Ask your doctor what other things that you can do to help prevent falls. This information is not intended to replace advice given to you by your health care provider. Make sure you discuss any questions you have with your health care provider. Document Released: 08/29/2009 Document Revised: 04/09/2016 Document Reviewed: 12/07/2014 Elsevier Interactive Patient Education  2017 [employer].

## 2022-04-18 DIAGNOSIS — S46812A Strain of other muscles, fascia and tendons at shoulder and upper arm level, left arm, initial encounter: Secondary | ICD-10-CM | POA: Diagnosis not present

## 2022-04-18 DIAGNOSIS — M545 Low back pain, unspecified: Secondary | ICD-10-CM | POA: Diagnosis not present

## 2022-06-25 ENCOUNTER — Ambulatory Visit: Payer: Medicare PPO | Admitting: Podiatry

## 2022-06-25 ENCOUNTER — Encounter: Payer: Self-pay | Admitting: Podiatry

## 2022-06-25 DIAGNOSIS — M76821 Posterior tibial tendinitis, right leg: Secondary | ICD-10-CM

## 2022-06-25 DIAGNOSIS — M7671 Peroneal tendinitis, right leg: Secondary | ICD-10-CM | POA: Diagnosis not present
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
## 2022-07-15 ENCOUNTER — Encounter: Payer: Self-pay | Admitting: Physician Assistant

## 2022-07-15 ENCOUNTER — Ambulatory Visit: Payer: Medicare PPO | Admitting: Physician Assistant

## 2022-07-15 VITALS — BP 124/74 | HR 97 | Temp 98.7°F | Resp 16 | Wt 203.0 lb

## 2022-07-15 DIAGNOSIS — S0006XS Insect bite (nonvenomous) of scalp, sequela: Secondary | ICD-10-CM

## 2022-07-15 DIAGNOSIS — M2550 Pain in unspecified joint: Secondary | ICD-10-CM | POA: Diagnosis not present

## 2022-07-15 DIAGNOSIS — M158 Other polyosteoarthritis: Secondary | ICD-10-CM | POA: Diagnosis not present

## 2022-07-15 DIAGNOSIS — W57XXXS Bitten or stung by nonvenomous insect and other nonvenomous arthropods, sequela: Secondary | ICD-10-CM

## 2022-07-15 NOTE — Progress Notes (Signed)
I,Sulibeya S Dimas,acting as a Education administrator for [employer], PA-C.,have documented all relevant documentation on the behalf of Mardene Speak, PA-C,as directed by  [employer], PA-C while in the presence of [employer], PA-C.   Established patient visit   Patient: Allison [name]   DOB: 31-Jul-1951   71 y.o. Female  MRN: [id number] Visit Date: 07/15/2022  Today's healthcare provider: Mardene Speak, PA-C   Chief Complaint  Patient presents with   Leg Pain   Subjective    HPI  Patient here today C/O leg pain and hand pain "joint pain" x 3-4 weeks. Patient reports she did have a tick bite a few weeks ago. Patient reports leg pain is worse at night time while sleeping, and while sitting. She reports pain wakes her up from her sleep. Patient reports right hand pain is achy versus pain. Patient reports hand pain is present all the time. Patient denies any swelling or rash. Patient reports drinking a lot of water, which has helped with hand pain. Patient reports taking Tylenol $RemoveBefore'1000mg'VhsFdQrxiMbMA$  with ibuprofen $RemoveBefore'400mg'tegvMihauSioi$  everyday for pain. Patient reports moderate pain control with medications.   Medications: Outpatient Medications Prior to Visit  Medication Sig   Vitamin D, Ergocalciferol, (DRISDOL) 1.25 MG (50000 UNIT) CAPS capsule Take 1 capsule (50,000 Units total) by mouth every 7 (seven) days.   [DISCONTINUED] Calcium Carb-Cholecalciferol (CALCIUM + D3) 600-200 MG-UNIT TABS Take 2 tablets by mouth daily.    [DISCONTINUED] cyclobenzaprine (FLEXERIL) 10 MG tablet Take 1 tablet (10 mg total) by mouth 3 (three) times daily as needed for muscle spasms. (Patient not taking: Reported on 04/16/2022)   [DISCONTINUED] ferrous sulfate 325 (65 FE) MG tablet Take 325 mg by mouth daily with breakfast.   [DISCONTINUED] mirabegron ER (MYRBETRIQ) 50 MG TB24 tablet Take 1 tablet (50 mg total) by mouth daily. (Patient not taking: Reported on 04/16/2022)   [DISCONTINUED] naproxen (NAPROSYN) 500 MG tablet Take 1 tablet (500 mg  total) by mouth 2 (two) times daily with a meal. (Patient not taking: Reported on 04/16/2022)   [DISCONTINUED] RHOFADE 1 % CREA APPLY TO FACE QAM (Patient not taking: Reported on 04/16/2022)   [DISCONTINUED] triamcinolone ointment (KENALOG) 0.5 % Apply 1 application topically 2 (two) times daily. (Patient not taking: Reported on 04/16/2022)   No facility-administered medications prior to visit.    Review of Systems  Constitutional:  Negative for appetite change, chills and fever.  Respiratory:  Negative for chest tightness and shortness of breath.   Cardiovascular:  Negative for chest pain.  Gastrointestinal:  Negative for abdominal pain, nausea and vomiting.  Musculoskeletal:  Positive for arthralgias and myalgias.  Skin:  Negative for rash.  Psychiatric/Behavioral:  Positive for sleep disturbance.        Objective    BP 124/74 (BP Location: Right Arm, Patient Position: Sitting, Cuff Size: Large)   Pulse 97   Temp 98.7 F (37.1 C) (Oral)   Resp 16   Wt 203 lb (92.1 kg)   BMI 29.98 kg/m  BP Readings from Last 3 Encounters:  07/15/22 124/74  10/27/21 (!) 182/78  09/25/21 128/69   Wt Readings from Last 3 Encounters:  07/15/22 203 lb (92.1 kg)  04/16/22 200 lb (90.7 kg)  10/27/21 200 lb (90.7 kg)      Physical Exam Vitals reviewed.  Constitutional:      General: She is not in acute distress.    Appearance: Normal appearance. She is well-developed. She is not diaphoretic.  HENT:  Head: Normocephalic and atraumatic.  Eyes:     General: No scleral icterus.    Conjunctiva/sclera: Conjunctivae normal.  Neck:     Thyroid: No thyromegaly.  Cardiovascular:     Rate and Rhythm: Normal rate and regular rhythm.     Pulses: Normal pulses.     Heart sounds: Normal heart sounds. No murmur heard. Pulmonary:     Effort: Pulmonary effort is normal. No respiratory distress.     Breath sounds: Normal breath sounds. No wheezing, rhonchi or rales.  Musculoskeletal:     Cervical back:  Neck supple.     Right lower leg: No edema.     Left lower leg: No edema.  Lymphadenopathy:     Cervical: No cervical adenopathy.  Skin:    General: Skin is warm and dry.     Findings: No rash.  Neurological:     Mental Status: She is alert and oriented to person, place, and time. Mental status is at baseline.  Psychiatric:        Behavior: Behavior normal.        Thought Content: Thought content normal.        Judgment: Judgment normal.       No results found for any visits on 07/15/22.  Assessment & Plan     1. Arthralgia, unspecified joint  - Lyme Disease Serology w/Reflex - ANA w/Reflex if Positive - CBC with Differential/Platelet - Comprehensive metabolic panel - C-reactive protein - Sed Rate (ESR) - Uric acid - Rheumatoid Factor  2. Tick bite of scalp, sequela Hx of multiple tick bites - Lyme Disease Serology w/Reflex - CBC with Differential/Platelet - C-reactive protein - Sed Rate (ESR)  3. Other osteoarthritis involving multiple joints Advised to use Topical NSAIDs, CBD, and capsaicin/ can lower gastric and renal risks associated with oral NSAIDs. - Lyme Disease Serology w/Reflex - ANA w/Reflex if Positive - CBC with Differential/Platelet - Comprehensive metabolic panel - C-reactive protein - Sed Rate (ESR) - Uric acid - Rheumatoid Factor  FU as scheduled     The patient was advised to call back or seek an in-person evaluation if the symptoms worsen or if the condition fails to improve as anticipated.  I discussed the assessment and treatment plan with the patient. The patient was provided an opportunity to ask questions and all were answered. The patient agreed with the plan and demonstrated an understanding of the instructions.  The entirety of the information documented in the History of Present Illness, Review of Systems and Physical Exam were personally obtained by me. Portions of this information were initially documented by the CMA and reviewed  by me for thoroughness and accuracy. Portions of this note were created using dictation software and may contain typographical errors.        Total encounter time more than 30 minutes  Greater than 50% was spent in counseling and coordination of care with the patient   Elberta Leatherwood  [facility] [phone] (phone) [phone] (fax)  [place]

## 2022-07-16 LAB — ANA W/REFLEX IF POSITIVE: Anti Nuclear Antibody (ANA): NEGATIVE

## 2022-07-16 LAB — CBC WITH DIFFERENTIAL/PLATELET
Basophils Absolute: 0.1 10*3/uL (ref 0.0–0.2)
Basos: 1 %
EOS (ABSOLUTE): 0.1 10*3/uL (ref 0.0–0.4)
Eos: 2 %
Hematocrit: 37.8 % (ref 34.0–46.6)
Hemoglobin: 12.6 g/dL (ref 11.1–15.9)
Immature Grans (Abs): 0 10*3/uL (ref 0.0–0.1)
Immature Granulocytes: 1 %
Lymphocytes Absolute: 1.3 10*3/uL (ref 0.7–3.1)
Lymphs: 24 %
MCH: 30 pg (ref 26.6–33.0)
MCHC: 33.3 g/dL (ref 31.5–35.7)
MCV: 90 fL (ref 79–97)
Monocytes Absolute: 0.5 10*3/uL (ref 0.1–0.9)
Monocytes: 9 %
Neutrophils Absolute: 3.4 10*3/uL (ref 1.4–7.0)
Neutrophils: 63 %
Platelets: 363 10*3/uL (ref 150–450)
RBC: 4.2 x10E6/uL (ref 3.77–5.28)
RDW: 13.1 % (ref 11.7–15.4)
WBC: 5.3 10*3/uL (ref 3.4–10.8)

## 2022-07-16 LAB — COMPREHENSIVE METABOLIC PANEL
ALT: 15 IU/L (ref 0–32)
AST: 20 IU/L (ref 0–40)
Albumin/Globulin Ratio: 2.2 (ref 1.2–2.2)
Albumin: 4.8 g/dL (ref 3.9–4.9)
Alkaline Phosphatase: 63 IU/L (ref 44–121)
BUN/Creatinine Ratio: 15 (ref 12–28)
BUN: 12 mg/dL (ref 8–27)
Bilirubin Total: 0.4 mg/dL (ref 0.0–1.2)
CO2: 23 mmol/L (ref 20–29)
Calcium: 9.8 mg/dL (ref 8.7–10.3)
Chloride: 101 mmol/L (ref 96–106)
Creatinine, Ser: 0.79 mg/dL (ref 0.57–1.00)
Globulin, Total: 2.2 g/dL (ref 1.5–4.5)
Glucose: 114 mg/dL — ABNORMAL HIGH (ref 70–99)
Potassium: 4.5 mmol/L (ref 3.5–5.2)
Sodium: 141 mmol/L (ref 134–144)
Total Protein: 7 g/dL (ref 6.0–8.5)
eGFR: 80 mL/min/{1.73_m2} (ref >59)

## 2022-07-16 LAB — URIC ACID: Uric Acid: 4 mg/dL (ref 3.0–7.2)

## 2022-07-16 LAB — LYME DISEASE SEROLOGY W/REFLEX: Lyme Total Antibody EIA: NEGATIVE

## 2022-07-16 LAB — C-REACTIVE PROTEIN: CRP: <1 mg/L (ref 0–10)

## 2022-07-16 LAB — SEDIMENTATION RATE: Sed Rate: 5 mm/hr (ref 0–40)

## 2022-07-16 LAB — RHEUMATOID FACTOR: Rheumatoid fact SerPl-aCnc: <10 IU/mL (ref <14.0)

## 2022-07-16 NOTE — Progress Notes (Signed)
[place] ,   Your labwork results all are within normal limits Except a slight increase in glucose. You might schedule a FU appt to discuss your current options. Ria Comment will be back next week. If her schedule will be busy, you are welcome to see me  Any questions please reach out to the office or message me on MyChart!  Best, Mardene Speak, PA-C

## 2022-07-17 ENCOUNTER — Telehealth: Payer: Self-pay

## 2022-07-17 NOTE — Telephone Encounter (Signed)
Pt given lab results per notes of Mardene Speak PA on 07/17/22. Pt verbalized understanding. Pt mentioned the lab work was drawn 1 hour after eating.

## 2022-07-21 ENCOUNTER — Ambulatory Visit: Payer: Medicare PPO | Admitting: Podiatry

## 2022-07-31 ENCOUNTER — Ambulatory Visit: Payer: Medicare PPO | Admitting: Physician Assistant

## 2022-07-31 DIAGNOSIS — H6123 Impacted cerumen, bilateral: Secondary | ICD-10-CM | POA: Diagnosis not present

## 2022-07-31 DIAGNOSIS — J301 Allergic rhinitis due to pollen: Secondary | ICD-10-CM | POA: Diagnosis not present

## 2022-07-31 DIAGNOSIS — G501 Atypical facial pain: Secondary | ICD-10-CM | POA: Diagnosis not present

## 2022-08-04 ENCOUNTER — Ambulatory Visit: Payer: Medicare PPO | Admitting: Podiatry

## 2022-08-04 DIAGNOSIS — M76821 Posterior tibial tendinitis, right leg: Secondary | ICD-10-CM | POA: Diagnosis not present

## 2022-08-04 MED ORDER — CYCLOBENZAPRINE HCL 10 MG PO TABS: 10.0000 mg | ORAL_TABLET | Freq: Three times a day (TID) | ORAL | 0 refills | Status: DC | PRN

## 2022-08-07 DIAGNOSIS — H52222 Regular astigmatism, left eye: Secondary | ICD-10-CM | POA: Diagnosis not present

## 2022-08-07 DIAGNOSIS — H04123 Dry eye syndrome of bilateral lacrimal glands: Secondary | ICD-10-CM | POA: Diagnosis not present

## 2022-08-07 DIAGNOSIS — D3131 Benign neoplasm of right choroid: Secondary | ICD-10-CM | POA: Diagnosis not present

## 2022-08-07 DIAGNOSIS — H43813 Vitreous degeneration, bilateral: Secondary | ICD-10-CM | POA: Diagnosis not present

## 2022-08-07 DIAGNOSIS — H53031 Strabismic amblyopia, right eye: Secondary | ICD-10-CM | POA: Diagnosis not present

## 2022-08-11 NOTE — Progress Notes (Signed)
  Subjective:  Patient ID: Allison Allison [name], female    DOB: [birth year],  MRN: [id number]  Chief Complaint  Patient presents with   Foot Allison [name]    71 y.o. female presents with the above complaint.  Presents for follow-up to right posterior tibial tendinitis.  She states she is doing a lot better the brace helps considerably.  She does have some muscle cramps that she start experiencing for which she would like to know if she can get a muscle relaxer.  She denies any other acute complaints.   Review of Systems: Negative except as noted in the HPI. Denies N/V/F/Ch.  Past Medical History:  Diagnosis Date   Medical history non-contributory    Rosacea     Current Outpatient Medications:    cyclobenzaprine (FLEXERIL) 10 MG tablet, Take 1 tablet (10 mg total) by mouth 3 (three) times daily as needed for muscle spasms., Disp: 30 tablet, Rfl: 0   Vitamin D, Ergocalciferol, (DRISDOL) 1.25 MG (50000 UNIT) CAPS capsule, Take 1 capsule (50,000 Units total) by mouth every 7 (seven) days., Disp: 13 capsule, Rfl: 3  Social History   Tobacco Use  Smoking Status Never  Smokeless Tobacco Never    Allergies  Allergen Reactions   Strawberry Extract     difficulty breathing   Tetracycline Rash   Objective:  There were no vitals filed for this visit. There is no height or weight on file to calculate BMI. Constitutional Well developed. Well nourished.  Vascular Dorsalis pedis pulses palpable bilaterally. Posterior tibial pulses palpable bilaterally. Capillary refill normal to all digits.  No cyanosis or clubbing noted. Pedal hair growth normal.  Neurologic Normal speech. Oriented to person, place, and time. Epicritic sensation to light touch grossly present bilaterally.  Dermatologic Nails well groomed and normal in appearance. No open wounds. No skin lesions.  Orthopedic: No further Allison [name] on palpation right along the course of the peroneal tendinitis and right posterior tibial tendinitis.   Allison [name] with resisted eversion and inversion of the foot.  No Allison [name] with range of motion of the ankle joint no Allison [name] at the Achilles tendon ATFL ligament.   Radiographs: None Assessment:   1. Posterior tibial tendinitis, right     Plan:  Patient was evaluated and treated and all questions answered.  Right peroneal tendinitis/posterior tibial tendinitis -Clinically healed and doing well.  Patient was also experiencing muscle cramp for which Flexeril was sent to the pharmacy.  Continue using brace as needed if any foot and ankle issues on future advised her to come back and see me.  She states understanding  No follow-ups on file.

## 2022-09-02 DIAGNOSIS — M25571 Pain in right ankle and joints of right foot: Secondary | ICD-10-CM | POA: Diagnosis not present

## 2022-09-03 DIAGNOSIS — L820 Inflamed seborrheic keratosis: Secondary | ICD-10-CM | POA: Diagnosis not present

## 2022-09-04 DIAGNOSIS — M25571 Pain in right ankle and joints of right foot: Secondary | ICD-10-CM | POA: Diagnosis not present

## 2022-09-14 DIAGNOSIS — M25571 Pain in right ankle and joints of right foot: Secondary | ICD-10-CM | POA: Diagnosis not present

## 2022-09-18 DIAGNOSIS — M25571 Pain in right ankle and joints of right foot: Secondary | ICD-10-CM | POA: Diagnosis not present

## 2022-09-20 ENCOUNTER — Encounter: Payer: Self-pay | Admitting: Physician Assistant

## 2022-09-21 DIAGNOSIS — M25571 Pain in right ankle and joints of right foot: Secondary | ICD-10-CM | POA: Diagnosis not present

## 2022-09-28 DIAGNOSIS — M25571 Pain in right ankle and joints of right foot: Secondary | ICD-10-CM | POA: Diagnosis not present

## 2022-09-29 ENCOUNTER — Encounter: Payer: Medicare PPO | Admitting: Physician Assistant

## 2022-09-30 DIAGNOSIS — M25571 Pain in right ankle and joints of right foot: Secondary | ICD-10-CM | POA: Diagnosis not present

## 2022-10-02 ENCOUNTER — Other Ambulatory Visit: Payer: Self-pay | Admitting: Family Medicine

## 2022-10-02 NOTE — Telephone Encounter (Signed)
Requested medication (s) are due for refill today: yes  Requested medication (s) are on the active medication list: yes  Last refill:  09/30/21  Future visit scheduled: yes  Notes to clinic:   Manual Review: Route requests for 50,000 IU strength to the provider      Requested Prescriptions  Pending Prescriptions Disp Refills   Vitamin D, Ergocalciferol, (DRISDOL) 1.25 MG (50000 UNIT) CAPS capsule [Pharmacy Med Name: VITAMIN D2 50,000IU (ERGO) CAP RX] 13 capsule 3    Sig: TAKE 1 CAPSULE BY MOUTH EVERY 7 DAYS     Endocrinology:  Vitamins - Vitamin D Supplementation 2 Failed - 10/02/2022 11:20 AM      Failed - Manual Review: Route requests for 50,000 IU strength to the provider      Failed - Vitamin D in normal range and within 360 days    Vit D, 25-Hydroxy  Date Value Ref Range Status  09/25/2021 35.3 30.0 - 100.0 ng/mL Final    Comment:    Vitamin D deficiency has been defined by the [place] and an Endocrine Society practice guideline as a level of serum 25-OH vitamin D less than 20 ng/mL (1,2). The Endocrine Society went on to further define vitamin D insufficiency as a level between 21 and 29 ng/mL (2). 1. IOM (Institute of Medicine). 2010. Dietary reference    intakes for calcium and D. [place]: The    [employer]. 2. Holick MF, Binkley Tifton, Bischoff-Ferrari HA, et al.    Evaluation, treatment, and prevention of vitamin D    deficiency: an Endocrine Society clinical practice    guideline. JCEM. 2011 Jul; 96(7):1911-30.          Passed - Ca in normal range and within 360 days    Calcium  Date Value Ref Range Status  07/15/2022 9.8 8.7 - 10.3 mg/dL Final   Calcium, Total  Date Value Ref Range Status  12/02/2012 9.1 8.5 - 10.1 mg/dL Final         Passed - Valid encounter within last 12 months    Recent Outpatient Visits           2 months ago Arthralgia, unspecified joint   [facility] Mc[place], [place], PA-C   1 year  ago Annual physical exam   [facility] Gwyneth Sprout, FNP   1 year ago Sciatica of left side   [facility] [place], Clearnce Sorrel, [place]   2 years ago Annual physical exam   [facility] Mar Daring, [place]   2 years ago [place], Dionne Bucy, MD       Future Appointments             In 2 weeks Thedore Mins, Ria Comment, PA-C [facility], [place]

## 2022-10-05 DIAGNOSIS — M25571 Pain in right ankle and joints of right foot: Secondary | ICD-10-CM | POA: Diagnosis not present

## 2022-10-09 DIAGNOSIS — M25571 Pain in right ankle and joints of right foot: Secondary | ICD-10-CM | POA: Diagnosis not present

## 2022-10-12 ENCOUNTER — Encounter: Payer: Medicare PPO | Admitting: Family Medicine

## 2022-10-12 DIAGNOSIS — M25571 Pain in right ankle and joints of right foot: Secondary | ICD-10-CM | POA: Diagnosis not present

## 2022-10-16 NOTE — Progress Notes (Unsigned)
I,Allison [name],acting as a Education administrator for [employer], PA-C.,have documented all relevant documentation on the behalf of Allison Kirschner, PA-C,as directed by  Allison Kirschner, PA-C while in the presence of Allison Kirschner, PA-C.   Complete physical exam   Patient: Allison [name]   DOB: September 01, 1951   71 y.o. Female  MRN: [id number] Visit Date: 10/19/2022  Today's healthcare provider: [name], PA-C  Cc. cpe  Subjective    Ashelynn [name] is a 71 y.o. female who presents today for a complete physical exam.  She reports consuming a general diet. Home exercise routine includes walking 1 hrs per day. She generally feels well. She reports sleeping fairly well. She does not have additional problems to discuss today.  HPI  She reports walking about 5,000 steps a day, eating three meals a day, fish, chicken, steamed veggies heavy. She is concerned over her gradual weight gain.  Past Medical History:  Diagnosis Date   Medical history non-contributory    Rosacea    Past Surgical History:  Procedure Laterality Date   BREAST BIOPSY Left 2002   benign   CATARACT EXTRACTION W/PHACO Right 07/06/2017   Procedure: CATARACT EXTRACTION PHACO AND INTRAOCULAR LENS PLACEMENT ([place]);  Surgeon: Birder Robson, MD;  Location: [facility] ORS;  Service: Ophthalmology;  Laterality: Right;  [place] 00:34.2 AP% 12.7 CDE 4.33 Fluid Pack lot # [id number]   CATARACT EXTRACTION W/PHACO Left 10/03/2019   Procedure: CATARACT EXTRACTION PHACO AND INTRAOCULAR LENS PLACEMENT ([place]) LEFT;  Surgeon: Birder Robson, MD;  Location: [place];  Service: Ophthalmology;  Laterality: Left;  CDE 6.44 [place] 0:47.3   COLONOSCOPY WITH PROPOFOL N/A 10/11/2018   Procedure: COLONOSCOPY WITH PROPOFOL;  Surgeon: Lucilla Lame, MD;  Location: [facility] ENDOSCOPY;  Service: Endoscopy;  Laterality: N/A;   Social History   Socioeconomic History   Marital status: Married    Spouse name: Not on file   Number of children: 0    Years of education: Not on file   Highest education level: Master's degree (e.g., MA, MS, MEng, MEd, MSW, MBA)  Occupational History   Occupation: Armed forces operational officer  Tobacco Use   Smoking status: Never   Smokeless tobacco: Never  Vaping Use   Vaping Use: Never used  Substance and Sexual Activity   Alcohol use: Yes    Alcohol/week: 14.0 standard drinks of alcohol    Types: 14 Glasses of wine per week    Comment: 2 a day   Drug use: No   Sexual activity: Not on file  Other Topics Concern   Not on file  Social History Narrative   Not on file   Social Determinants of Health   Financial Resource Strain: Low Risk  (04/16/2022)   Overall Financial Resource Strain (CARDIA)    Difficulty of Paying Living Expenses: Not hard at all  Food Insecurity: No Food Insecurity (04/16/2022)   Hunger Vital Sign    Worried About Running Out of Food in the Last Year: Never true    Ran Out of Food in the Last Year: Never true  Transportation Needs: No Transportation Needs (04/16/2022)   PRAPARE - Hydrologist (Medical): No    Lack of Transportation (Non-Medical): No  Physical Activity: Sufficiently Active (04/16/2022)   Exercise Vital Sign    Days of Exercise per Week: 5 days    Minutes of Exercise per Session: 60 min  Stress: No Stress Concern Present (04/16/2022)   [place]  Stress Questionnaire    Feeling of Stress : Only a little  Social Connections: Moderately Integrated (04/16/2022)   Social Connection and Isolation Panel [NHANES]    Frequency of Communication with Friends and Family: More than three times a week    Frequency of Social Gatherings with Friends and Family: Once a week    Attends Religious Services: Never    Marine scientist or Organizations: Yes    Attends Music therapist: More than 4 times per year    Marital Status: Married  Human resources officer Violence: Not At Risk (04/16/2022)   Humiliation,  Afraid, Rape, and Kick questionnaire    Fear of Current or Ex-Partner: No    Emotionally Abused: No    Physically Abused: No    Sexually Abused: No   Family Status  Relation Name Status   Mother  Deceased at age 72       bladder cancer   Father  Deceased at age 32       CVA   Sister  Deceased at age 41       breast cancer   Brother  Alive   Brother  Alive   Family History  Problem Relation Age of Onset   Bladder Cancer Mother    Breast cancer Mother    Stroke Father    Hyperlipidemia Father    Breast cancer Sister 56   Kidney Stones Brother    Hyperlipidemia Brother    Kidney Stones Brother    Diverticulosis Brother    Allergies  Allergen Reactions   Strawberry Extract     difficulty breathing   Tetracycline Rash    Patient Care Team: Allison Kirschner, PA-C as PCP - General (Physician Assistant) Carloyn Manner, MD as Referring Physician (Otolaryngology) Cleaster Corin, OD (Optometry)   Medications: Outpatient Medications Prior to Visit  Medication Sig   cyclobenzaprine (FLEXERIL) 10 MG tablet Take 1 tablet (10 mg total) by mouth 3 (three) times daily as needed for muscle spasms.   Vitamin D, Ergocalciferol, (DRISDOL) 1.25 MG (50000 UNIT) CAPS capsule TAKE 1 CAPSULE BY MOUTH EVERY 7 DAYS   No facility-administered medications prior to visit.    Review of Systems  Constitutional:  Positive for unexpected weight change. Negative for fatigue and fever.  Respiratory:  Negative for cough and shortness of breath.   Cardiovascular:  Negative for chest pain and leg swelling.  Gastrointestinal:  Negative for abdominal pain.  Neurological:  Negative for dizziness and headaches.      Objective    Blood pressure 139/80, pulse 69, temperature 97.7 F (36.5 C), temperature source Oral, height '5\' 9"'$  (1.753 m), weight 204 lb (92.5 kg), SpO2 99 %.    Physical Exam Constitutional:      General: She is awake.     Appearance: She is well-developed. She is not  ill-appearing.  HENT:     Head: Normocephalic.     Right Ear: Tympanic membrane normal.     Left Ear: Tympanic membrane normal.     Nose: Nose normal. No congestion or rhinorrhea.     Mouth/Throat:     Pharynx: No oropharyngeal exudate or posterior oropharyngeal erythema.  Eyes:     Conjunctiva/sclera: Conjunctivae normal.     Pupils: Pupils are equal, round, and reactive to light.  Neck:     Thyroid: No thyroid mass or thyromegaly.  Cardiovascular:     Rate and Rhythm: Normal rate and regular rhythm.     Heart sounds: Normal heart sounds.  Pulmonary:     Effort: Pulmonary effort is normal.     Breath sounds: Normal breath sounds.  Abdominal:     Palpations: Abdomen is soft.     Tenderness: There is no abdominal tenderness.  Musculoskeletal:     Right lower leg: No swelling. No edema.     Left lower leg: No swelling. No edema.  Lymphadenopathy:     Cervical: No cervical adenopathy.  Skin:    General: Skin is warm.  Neurological:     Mental Status: She is alert and oriented to person, place, and time.  Psychiatric:        Attention and Perception: Attention normal.        Mood and Affect: Mood normal.        Speech: Speech normal.        Behavior: Behavior normal. Behavior is cooperative.     Last depression screening scores    10/19/2022    8:45 AM 07/15/2022    3:03 PM 04/16/2022    8:21 AM  PHQ 2/9 Scores  PHQ - 2 Score 0 0 0  PHQ- 9 Score 1 3 0   Last fall risk screening    10/19/2022    8:45 AM  [place] in the past year? 0  Number falls in past yr: 0  Injury with Fall? 0   Last Audit-C alcohol use screening    10/19/2022    8:45 AM  Alcohol Use Disorder Test (AUDIT)  1. How often do you have a drink containing alcohol? 4  2. How many drinks containing alcohol do you have on a typical day when you are drinking? 0  3. How often do you have six or more drinks on one occasion? 0  AUDIT-C Score 4   A score of 3 or more in women, and 4 or more in  men indicates increased risk for alcohol abuse, EXCEPT if all of the points are from question 1   No results found for any visits on 10/19/22.  Assessment & Plan    Routine Health Maintenance and Physical Exam  Exercise Activities and Dietary recommendations  Goals      DIET - EAT MORE FRUITS AND VEGETABLES     DIET - INCREASE WATER INTAKE     Recommend increasing water intake to 6-8 8 oz glasses a day.         Immunization History  Administered Date(s) Administered   Fluad Quad(high Dose 65+) 07/26/2019, 09/25/2021   Influenza Inj Mdck Quad Pf 09/26/2022   Influenza, High Dose Seasonal PF 08/23/2017, 08/17/2018, 08/05/2020   Influenza,inj,Quad PF,6+ Mos 08/08/2014, 08/14/2015, 08/14/2016   PFIZER(Purple Top)SARS-COV-2 Vaccination 12/15/2019, 01/05/2020, 06/29/2020   Pneumococcal Conjugate-13 08/23/2017   Pneumococcal Polysaccharide-23 08/08/2014, 09/29/2019   Td 02/14/2005   Zoster Recombinat (Shingrix) 08/23/2017, 11/05/2017   Zoster, Live 12/18/2011    Health Maintenance  Topic Date Due   DTaP/Tdap/Td (2 - Tdap) 02/15/2015   COVID-19 Vaccine (4 - 2023-24 season) 07/17/2022   MAMMOGRAM  11/24/2022   Medicare Annual Wellness (AWV)  04/17/2023   DEXA SCAN  11/25/2023   COLONOSCOPY (Pts 45-46yr Insurance coverage will need to be confirmed)  10/11/2028   Pneumonia Vaccine 70 Years old  Completed   INFLUENZA VACCINE  Completed   Hepatitis C Screening  Completed   Zoster Vaccines- Shingrix  Completed   HPV VACCINES  Aged Out    Discussed health benefits of physical activity, and encouraged her to engage in regular  exercise appropriate for her age and condition.  Problem List Items Addressed This Visit       Other   Moderate mixed hyperlipidemia not requiring statin therapy    The 10-year ASCVD risk score (Arnett DK, et al., 2019) is: 10.5%  Repeat fasting lipids Consider statin for risk reduction      Relevant Orders   Lipid panel   Avitaminosis D    Pt is  managed on a weekly vit d supplement Will recheck levels      Relevant Orders   Vitamin D (25 hydroxy)   Prediabetes    Repeat A1c Discussed diet and exercise      Relevant Orders   HgB A1c   Annual physical exam - Primary   Weight gain    09/2019 188 lbs, 09/2021 200 lbs, today 204 lbs Gradual weight gain Discussed, diet , exercise. Will check tsh/t4 pt has history of hyperthyroidism that was treated years ago       Relevant Orders   TSH + free T4   Other Visit Diagnoses     Breast cancer screening by mammogram       Relevant Orders   MM 3D SCREEN BREAST BILATERAL        Return in about 1 year (around 10/20/2023) for CPE.     I, Allison Kirschner, PA-C have reviewed all documentation for this visit. The documentation on  10/19/2022 for the exam, diagnosis, procedures, and orders are all accurate and complete.  Allison Kirschner, PA-C [facility] [address] [place], [place], [address] Office: [phone] Fax: [place]

## 2022-10-19 ENCOUNTER — Ambulatory Visit (INDEPENDENT_AMBULATORY_CARE_PROVIDER_SITE_OTHER): Payer: Medicare PPO | Admitting: Physician Assistant

## 2022-10-19 ENCOUNTER — Encounter: Payer: Self-pay | Admitting: Physician Assistant

## 2022-10-19 VITALS — BP 139/80 | HR 69 | Temp 97.7°F | Ht 69.0 in | Wt 204.0 lb

## 2022-10-19 DIAGNOSIS — E559 Vitamin D deficiency, unspecified: Secondary | ICD-10-CM | POA: Diagnosis not present

## 2022-10-19 DIAGNOSIS — R7303 Prediabetes: Secondary | ICD-10-CM

## 2022-10-19 DIAGNOSIS — Z0000 Encounter for general adult medical examination without abnormal findings: Secondary | ICD-10-CM

## 2022-10-19 DIAGNOSIS — R635 Abnormal weight gain: Secondary | ICD-10-CM

## 2022-10-19 DIAGNOSIS — E782 Mixed hyperlipidemia: Secondary | ICD-10-CM

## 2022-10-19 DIAGNOSIS — Z1231 Encounter for screening mammogram for malignant neoplasm of breast: Secondary | ICD-10-CM

## 2022-10-19 NOTE — Assessment & Plan Note (Signed)
09/2019 188 lbs, 09/2021 200 lbs, today 204 lbs Gradual weight gain Discussed, diet , exercise. Will check tsh/t4 pt has history of hyperthyroidism that was treated years ago

## 2022-10-19 NOTE — Assessment & Plan Note (Signed)
Repeat A1c Discussed diet and exercise

## 2022-10-19 NOTE — Assessment & Plan Note (Addendum)
The 10-year ASCVD risk score (Arnett DK, et al., 2019) is: 10.5%  Repeat fasting lipids Consider statin for risk reduction

## 2022-10-19 NOTE — Assessment & Plan Note (Signed)
Pt is managed on a weekly vit d supplement Will recheck levels

## 2022-10-20 DIAGNOSIS — M25571 Pain in right ankle and joints of right foot: Secondary | ICD-10-CM | POA: Diagnosis not present

## 2022-10-20 LAB — HEMOGLOBIN A1C
Est. average glucose Bld gHb Est-mCnc: 117 mg/dL
Hgb A1c MFr Bld: 5.7 % — ABNORMAL HIGH (ref 4.8–5.6)

## 2022-10-20 LAB — VITAMIN D 25 HYDROXY (VIT D DEFICIENCY, FRACTURES): Vit D, 25-Hydroxy: 43 ng/mL (ref 30.0–100.0)

## 2022-10-20 LAB — LIPID PANEL
Chol/HDL Ratio: 2.9 ratio (ref 0.0–4.4)
Cholesterol, Total: 183 mg/dL (ref 100–199)
HDL: 63 mg/dL (ref >39)
LDL Chol Calc (NIH): 111 mg/dL — ABNORMAL HIGH (ref 0–99)
Triglycerides: 48 mg/dL (ref 0–149)
VLDL Cholesterol Cal: 9 mg/dL (ref 5–40)

## 2022-10-20 LAB — TSH+FREE T4
Free T4: 0.97 ng/dL (ref 0.82–1.77)
TSH: 2.66 u[IU]/mL (ref 0.450–4.500)

## 2022-10-23 DIAGNOSIS — M25571 Pain in right ankle and joints of right foot: Secondary | ICD-10-CM | POA: Diagnosis not present

## 2022-10-26 DIAGNOSIS — M25571 Pain in right ankle and joints of right foot: Secondary | ICD-10-CM | POA: Diagnosis not present

## 2022-10-30 ENCOUNTER — Ambulatory Visit (INDEPENDENT_AMBULATORY_CARE_PROVIDER_SITE_OTHER): Payer: Medicare PPO | Admitting: Physician Assistant

## 2022-10-30 ENCOUNTER — Encounter: Payer: Self-pay | Admitting: Physician Assistant

## 2022-10-30 ENCOUNTER — Ambulatory Visit: Payer: Self-pay | Admitting: *Deleted

## 2022-10-30 VITALS — BP 138/85 | HR 82 | Resp 16 | Ht 69.0 in | Wt 207.0 lb

## 2022-10-30 DIAGNOSIS — W19XXXA Unspecified fall, initial encounter: Secondary | ICD-10-CM

## 2022-10-30 DIAGNOSIS — M5441 Lumbago with sciatica, right side: Secondary | ICD-10-CM | POA: Diagnosis not present

## 2022-10-30 DIAGNOSIS — M79602 Pain in left arm: Secondary | ICD-10-CM | POA: Diagnosis not present

## 2022-10-30 DIAGNOSIS — M25571 Pain in right ankle and joints of right foot: Secondary | ICD-10-CM | POA: Diagnosis not present

## 2022-10-30 NOTE — Progress Notes (Signed)
      Established patient visit   Patient: Allison [name]   DOB: 1951/03/21   71 y.o. Female  MRN: [id number] Visit Date: 10/30/2022  Today's healthcare provider: [name], PA-C   I,Tiffany J Bragg,acting as a scribe for [name], PA-C.,have documented all relevant documentation on the behalf of [name], PA-C,as directed by  [name], PA-C while in the presence of [name], PA-C.   Chief Complaint  Patient presents with   Fall    Patient states she fell on 12/4, landing on her knees, R hand and L arm and R hip.     Subjective    HPI  Pt reports mechanical trip and fall over a friends dog 10/19/22. Fell on left knee, left hip, left arm. Denies any issues with ROM but reports continued L arm and shoulder pain, and right sided lower back pain. Denies head injury  She would like to continue with physical therapy.     Medications: Outpatient Medications Prior to Visit  Medication Sig   cyclobenzaprine (FLEXERIL) 10 MG tablet Take 1 tablet (10 mg total) by mouth 3 (three) times daily as needed for muscle spasms.   Vitamin D, Ergocalciferol, (DRISDOL) 1.25 MG (50000 UNIT) CAPS capsule TAKE 1 CAPSULE BY MOUTH EVERY 7 DAYS   No facility-administered medications prior to visit.    Review of Systems  Constitutional:  Negative for fatigue and fever.  Respiratory:  Negative for cough and shortness of breath.   Cardiovascular:  Negative for chest pain and leg swelling.  Gastrointestinal:  Negative for abdominal pain.  Neurological:  Negative for dizziness and headaches.       Objective    BP 138/85 (BP Location: Right Arm, Patient Position: Sitting, Cuff Size: Large)   Pulse 82   Resp 16   Ht '5\' 9"'$  (1.753 m)   Wt 207 lb (93.9 kg)   SpO2 97%   BMI 30.57 kg/m   Physical Exam Vitals reviewed.  Constitutional:      Appearance: She is not ill-appearing.  HENT:     Head: Normocephalic.  Eyes:     Conjunctiva/sclera: Conjunctivae normal.   Cardiovascular:     Rate and Rhythm: Normal rate.  Pulmonary:     Effort: Pulmonary effort is normal. No respiratory distress.  Musculoskeletal:     Comments: Ecchymosis without edema to lateral L knee. Tenderness to L bicep, possible hematoma as there is a firm ~1 cm area Tenderness and tension to L trap  Neurological:     General: No focal deficit present.     Mental Status: She is alert and oriented to person, place, and time.  Psychiatric:        Mood and Affect: Mood normal.        Behavior: Behavior normal.      No results found for any visits on 10/30/22.  Assessment & Plan     Fall 2. L shoulder pain 3. R low back pain  Recommending ice/heat. Ref back to PT [facility] to take flexeril prn qhs  Return if symptoms worsen or fail to improve.      I, [name], PA-C have reviewed all documentation for this visit. The documentation on  10/30/2022  for the exam, diagnosis, procedures, and orders are all accurate and complete.  [name], PA-C [facility] [address] [place], [place], [address] Office: [phone] Fax: [place]

## 2022-10-30 NOTE — Telephone Encounter (Signed)
Patient was seen in office today.  

## 2022-10-30 NOTE — Telephone Encounter (Signed)
  Chief Complaint: fell 10/19/22. Requesting PT orders for therapy  Symptoms: fell over dog while walking hitting left side . Left shoulder and arm pain. Left leg bruising. Twisted right hip. Difficulty sleeping at night . Can walk and do normal activities. Reports pain left arm still noted from flu shot on 09/19/22. Frequency: 10/19/22 Pertinent Negatives: Patient denies hitting head. No worsening sx  Disposition: '[]'$ ED /'[]'$ Urgent Care (no appt availability in office) / '[x]'$ Appointment(In office/virtual)/ '[]'$  [place] [facility]/ '[]'$ Home Care/ '[]'$ Refused Recommended Disposition /'[]'$ [place] [facility]/ '[]'$  Follow-up with PCP Additional Notes:   Appt scheduled for today .    Reason for Disposition  [1] MODERATE weakness (i.e., interferes with work, school, normal activities) AND [2] new-onset or worsening  Answer Assessment - Initial Assessment Questions 1. MECHANISM: "How did the fall happen?"     Fell over dog when walking hit left arm and right hip 2. DOMESTIC VIOLENCE AND ELDER ABUSE SCREENING: "Did you fall because someone pushed you or tried to hurt you?" If Yes, ask: "Are you safe now?"     Na 3. ONSET: "When did the fall happen?" (e.g., minutes, hours, or days ago)     10/19/22 4. LOCATION: "What part of the body hit the ground?" (e.g., back, buttocks, head, hips, knees, hands, head, stomach)     Left side bruise to left leg  and hit left arm and twisted right hip  5. INJURY: "Did you hurt (injure) yourself when you fell?" If Yes, ask: "What did you injure? Tell me more about this?" (e.g., body area; type of injury; pain severity)"     Left leg bruised left arm/ shoulder pain can not lay on left side . Right hip pain  6. PAIN: "Is there any pain?" If Yes, ask: "How bad is the pain?" (e.g., Scale 1-10; or mild,  moderate, severe)   - NONE (0): No pain   - MILD (1-3): Doesn't interfere with normal activities    - MODERATE (4-7): Interferes with normal activities or awakens from sleep     - SEVERE (8-10): Excruciating pain, unable to do any normal activities      Moderate difficulty sleeping due to discomfort of body  7. SIZE: For cuts, bruises, or swelling, ask: "How large is it?" (e.g., inches or centimeters)      Bruising to left leg 8. PREGNANCY: "Is there any chance you are pregnant?" "When was your last menstrual period?"     na 9. OTHER SYMPTOMS: "Do you have any other symptoms?" (e.g., dizziness, fever, weakness; new onset or worsening).      Denies  10. CAUSE: "What do you think caused the fall (or falling)?" (e.g., tripped, dizzy spell)       [address] over a dog while walking  Protocols used: Falls and [facility]

## 2022-11-10 DIAGNOSIS — M79602 Pain in left arm: Secondary | ICD-10-CM | POA: Diagnosis not present

## 2022-11-10 DIAGNOSIS — M5441 Lumbago with sciatica, right side: Secondary | ICD-10-CM | POA: Diagnosis not present

## 2022-11-13 DIAGNOSIS — M5441 Lumbago with sciatica, right side: Secondary | ICD-10-CM | POA: Diagnosis not present

## 2022-11-13 DIAGNOSIS — M79602 Pain in left arm: Secondary | ICD-10-CM | POA: Diagnosis not present

## 2022-11-18 DIAGNOSIS — M79602 Pain in left arm: Secondary | ICD-10-CM | POA: Diagnosis not present

## 2022-11-18 DIAGNOSIS — M5441 Lumbago with sciatica, right side: Secondary | ICD-10-CM | POA: Diagnosis not present

## 2022-11-20 DIAGNOSIS — M5441 Lumbago with sciatica, right side: Secondary | ICD-10-CM | POA: Diagnosis not present

## 2022-11-20 DIAGNOSIS — M79602 Pain in left arm: Secondary | ICD-10-CM | POA: Diagnosis not present

## 2022-11-23 DIAGNOSIS — M5441 Lumbago with sciatica, right side: Secondary | ICD-10-CM | POA: Diagnosis not present

## 2022-11-23 DIAGNOSIS — M79602 Pain in left arm: Secondary | ICD-10-CM | POA: Diagnosis not present

## 2022-11-30 DIAGNOSIS — M79602 Pain in left arm: Secondary | ICD-10-CM | POA: Diagnosis not present

## 2022-11-30 DIAGNOSIS — M5441 Lumbago with sciatica, right side: Secondary | ICD-10-CM | POA: Diagnosis not present

## 2022-12-03 DIAGNOSIS — M5441 Lumbago with sciatica, right side: Secondary | ICD-10-CM | POA: Diagnosis not present

## 2022-12-03 DIAGNOSIS — M79602 Pain in left arm: Secondary | ICD-10-CM | POA: Diagnosis not present

## 2022-12-10 DIAGNOSIS — M79602 Pain in left arm: Secondary | ICD-10-CM | POA: Diagnosis not present

## 2022-12-10 DIAGNOSIS — M5441 Lumbago with sciatica, right side: Secondary | ICD-10-CM | POA: Diagnosis not present

## 2022-12-14 ENCOUNTER — Ambulatory Visit
Admission: RE | Admit: 2022-12-14 | Discharge: 2022-12-14 | Disposition: A | Discharge status: Home / Self Care | Payer: Medicare PPO | Source: Ambulatory Visit | Attending: Physician Assistant | Admitting: Physician Assistant

## 2022-12-14 DIAGNOSIS — Z1231 Encounter for screening mammogram for malignant neoplasm of breast: Secondary | ICD-10-CM | POA: Insufficient documentation

## 2022-12-17 DIAGNOSIS — M5441 Lumbago with sciatica, right side: Secondary | ICD-10-CM | POA: Diagnosis not present

## 2022-12-17 DIAGNOSIS — M79602 Pain in left arm: Secondary | ICD-10-CM | POA: Diagnosis not present

## 2022-12-25 DIAGNOSIS — M5441 Lumbago with sciatica, right side: Secondary | ICD-10-CM | POA: Diagnosis not present

## 2022-12-25 DIAGNOSIS — M79602 Pain in left arm: Secondary | ICD-10-CM | POA: Diagnosis not present

## 2023-01-01 DIAGNOSIS — M79602 Pain in left arm: Secondary | ICD-10-CM | POA: Diagnosis not present

## 2023-01-01 DIAGNOSIS — M5441 Lumbago with sciatica, right side: Secondary | ICD-10-CM | POA: Diagnosis not present

## 2023-01-06 ENCOUNTER — Ambulatory Visit
Admission: RE | Admit: 2023-01-06 | Discharge: 2023-01-06 | Disposition: A | Discharge status: Home / Self Care | Payer: Medicare PPO | Attending: Physician Assistant | Admitting: Physician Assistant

## 2023-01-06 ENCOUNTER — Ambulatory Visit
Admission: RE | Admit: 2023-01-06 | Discharge: 2023-01-06 | Disposition: A | Discharge status: Home / Self Care | Payer: Medicare PPO | Source: Ambulatory Visit | Attending: Physician Assistant | Admitting: Physician Assistant

## 2023-01-06 ENCOUNTER — Ambulatory Visit: Payer: Medicare PPO | Admitting: Physician Assistant

## 2023-01-06 ENCOUNTER — Encounter: Payer: Self-pay | Admitting: Physician Assistant

## 2023-01-06 VITALS — Ht 69.0 in | Wt 206.0 lb

## 2023-01-06 DIAGNOSIS — G8929 Other chronic pain: Secondary | ICD-10-CM | POA: Insufficient documentation

## 2023-01-06 DIAGNOSIS — M25512 Pain in left shoulder: Secondary | ICD-10-CM

## 2023-01-06 DIAGNOSIS — M79602 Pain in left arm: Secondary | ICD-10-CM | POA: Insufficient documentation

## 2023-01-06 DIAGNOSIS — Z043 Encounter for examination and observation following other accident: Secondary | ICD-10-CM | POA: Diagnosis not present

## 2023-01-06 NOTE — Progress Notes (Signed)
      Established patient visit   Patient: Allison [name]   DOB: 09/20/1951   72 y.o. Female  MRN: [id number] Visit Date: 01/06/2023  Today's healthcare provider: [name], PA-C   Cc. Left shoulder pain x 2 months  Subjective    HPI  Pt reports pain in left shoulder despite physical therapy. Recommendations were to return to PCP office.  Pt had a mechanical fall over a dog 10/19/22 where she injured her left knee, hip, and shoulder.  Medications: Outpatient Medications Prior to Visit  Medication Sig   cyclobenzaprine (FLEXERIL) 10 MG tablet Take 1 tablet (10 mg total) by mouth 3 (three) times daily as needed for muscle spasms.   Vitamin D, Ergocalciferol, (DRISDOL) 1.25 MG (50000 UNIT) CAPS capsule TAKE 1 CAPSULE BY MOUTH EVERY 7 DAYS   No facility-administered medications prior to visit.     Objective    Ht 5' 9"$  (1.753 m)   Wt 206 lb (93.4 kg)   BMI 30.42 kg/m    Physical Exam Vitals reviewed.  Constitutional:      Appearance: She is not ill-appearing.  HENT:     Head: Normocephalic.  Eyes:     Conjunctiva/sclera: Conjunctivae normal.  Cardiovascular:     Rate and Rhythm: Normal rate.  Pulmonary:     Effort: Pulmonary effort is normal. No respiratory distress.  Musculoskeletal:     Comments: Almost pinpoint tenderness to L medial upperextremity.  Pain with abduction, external rotation  Neurological:     General: No focal deficit present.     Mental Status: She is alert and oriented to person, place, and time.  Psychiatric:        Mood and Affect: Mood normal.        Behavior: Behavior normal.      No results found for any visits on 01/06/23.  Assessment & Plan     Left arm pain 2. Left shoulder pain  We never completed plain images as there were no signs of trauma to left arm after fall Recommending today starting with xrays L shoulder and L upper extremity   From there will refer to ortho  Return if symptoms worsen or fail to improve.       I, [name], PA-C have reviewed all documentation for this visit. The documentation on  01/06/23  for the exam, diagnosis, procedures, and orders are all accurate and complete.  [name], PA-C [facility] [address] [place], [place], [address] Office: [phone] Fax: [place]

## 2023-01-08 ENCOUNTER — Encounter: Payer: Self-pay | Admitting: Physician Assistant

## 2023-01-08 ENCOUNTER — Other Ambulatory Visit: Payer: Self-pay | Admitting: Physician Assistant

## 2023-01-08 ENCOUNTER — Telehealth: Payer: Self-pay | Admitting: Physician Assistant

## 2023-01-08 DIAGNOSIS — G8929 Other chronic pain: Secondary | ICD-10-CM

## 2023-01-08 NOTE — Telephone Encounter (Signed)
Copied from [place] [id number]. Topic: General - Other >> Jan 08, 2023  8:12 AM Sabas Sous wrote: Reason for CRM: Pt called requesting to speak to her PCP about her imaging results. Please advise

## 2023-01-08 NOTE — Telephone Encounter (Signed)
Please advise 

## 2023-01-13 DIAGNOSIS — M25512 Pain in left shoulder: Secondary | ICD-10-CM | POA: Diagnosis not present

## 2023-01-26 DIAGNOSIS — M25512 Pain in left shoulder: Secondary | ICD-10-CM | POA: Diagnosis not present

## 2023-01-28 DIAGNOSIS — J301 Allergic rhinitis due to pollen: Secondary | ICD-10-CM | POA: Diagnosis not present

## 2023-01-28 DIAGNOSIS — H6063 Unspecified chronic otitis externa, bilateral: Secondary | ICD-10-CM | POA: Diagnosis not present

## 2023-01-28 DIAGNOSIS — H6123 Impacted cerumen, bilateral: Secondary | ICD-10-CM | POA: Diagnosis not present

## 2023-02-17 DIAGNOSIS — M75101 Unspecified rotator cuff tear or rupture of right shoulder, not specified as traumatic: Secondary | ICD-10-CM | POA: Diagnosis not present

## 2023-02-17 DIAGNOSIS — M19011 Primary osteoarthritis, right shoulder: Secondary | ICD-10-CM | POA: Diagnosis not present

## 2023-03-31 DIAGNOSIS — M75102 Unspecified rotator cuff tear or rupture of left shoulder, not specified as traumatic: Secondary | ICD-10-CM | POA: Diagnosis not present

## 2023-03-31 DIAGNOSIS — M7542 Impingement syndrome of left shoulder: Secondary | ICD-10-CM | POA: Diagnosis not present

## 2023-04-19 ENCOUNTER — Ambulatory Visit (INDEPENDENT_AMBULATORY_CARE_PROVIDER_SITE_OTHER): Payer: Medicare PPO

## 2023-04-19 VITALS — Ht 69.0 in | Wt 193.0 lb

## 2023-04-19 DIAGNOSIS — Z1211 Encounter for screening for malignant neoplasm of colon: Secondary | ICD-10-CM

## 2023-04-19 DIAGNOSIS — Z0000 Encounter for general adult medical examination without abnormal findings: Secondary | ICD-10-CM | POA: Diagnosis not present

## 2023-04-19 NOTE — Progress Notes (Signed)
I connected with  [name] on 04/19/23 by a audio enabled telemedicine application and verified that I am speaking with the correct person using two identifiers.  Patient Location: Home  Provider Location: Office/Clinic  I discussed the limitations of evaluation and management by telemedicine. The patient expressed understanding and agreed to proceed.  Subjective:   Allison [name] is a 72 y.o. female who presents for Medicare Annual (Subsequent) preventive examination.  Review of Systems    Cardiac Risk Factors include: advanced age (>74men, >41 women);dyslipidemia    Objective:    Today's Vitals   04/19/23 0820  Weight: 193 lb (87.5 kg)  Height: 5\' 9"  (1.753 m)   Body mass index is 28.5 kg/m.     04/19/2023    8:29 AM 04/16/2022    8:24 AM 09/23/2020   10:06 AM 10/03/2019    6:57 AM 09/21/2019    1:22 PM 10/11/2018    7:20 AM 08/17/2018    9:05 AM  Advanced Directives  Does Patient Have a Medical Advance Directive? Yes No Yes Yes Yes Yes Yes  Type of Estate agent of [place];Living will  Healthcare Power of [place];Living will Healthcare Power of [place];Living will Healthcare Power of [place];Living will Healthcare Power of [place];Living will Healthcare Power of [place];Living will  Does patient want to make changes to medical advance directive?    No - Guardian declined     Copy of Healthcare Power of Attorney in Chart?   No - copy requested No - copy requested No - copy requested  No - copy requested  Would patient like information on creating a medical advance directive?  No - Patient declined         Current Medications (verified) Outpatient Encounter Medications as of 04/19/2023  Medication Sig   meloxicam (MOBIC) 7.5 MG tablet Take 1 tablet by mouth 2 (two) times daily with a meal.   Vitamin D, Ergocalciferol, (DRISDOL) 1.25 MG (50000 UNIT) CAPS capsule TAKE 1 CAPSULE BY MOUTH EVERY 7 DAYS   cyclobenzaprine (FLEXERIL) 10 MG tablet  Take 1 tablet (10 mg total) by mouth 3 (three) times daily as needed for muscle spasms. (Patient not taking: Reported on 04/19/2023)   No facility-administered encounter medications on file as of 04/19/2023.    Allergies (verified) Strawberry extract and Tetracycline   History: Past Medical History:  Diagnosis Date   Medical history non-contributory    Rosacea    Past Surgical History:  Procedure Laterality Date   BREAST BIOPSY Left 2002   benign   CATARACT EXTRACTION W/PHACO Right 07/06/2017   Procedure: CATARACT EXTRACTION PHACO AND INTRAOCULAR LENS PLACEMENT (IOC);  Surgeon: Galen Manila, MD;  Location: [facility] ORS;  Service: Ophthalmology;  Laterality: Right;  [place] 00:34.2 AP% 12.7 CDE 4.33 Fluid Pack lot # [id number]   CATARACT EXTRACTION W/PHACO Left 10/03/2019   Procedure: CATARACT EXTRACTION PHACO AND INTRAOCULAR LENS PLACEMENT (IOC) LEFT;  Surgeon: Galen Manila, MD;  Location: [facility];  Service: Ophthalmology;  Laterality: Left;  CDE 6.44 [place] 0:47.3   COLONOSCOPY WITH PROPOFOL N/A 10/11/2018   Procedure: COLONOSCOPY WITH PROPOFOL;  Surgeon: Midge Minium, MD;  Location: [facility]COPY;  Service: Endoscopy;  Laterality: N/A;   Family History  Problem Relation Age of Onset   Bladder Cancer Mother    Breast cancer Mother    Stroke Father    Hyperlipidemia Father    Breast cancer Sister 36   Kidney Stones Brother    Hyperlipidemia Brother    Kidney  Stones Brother    Diverticulosis Brother    Social History   Socioeconomic History   Marital status: Married    Spouse name: Not on file   Number of children: 0   Years of education: Not on file   Highest education level: Master's degree (e.g., MA, MS, MEng, MEd, MSW, MBA)  Occupational History   Occupation: Psychologist, sport and exercise  Tobacco Use   Smoking status: Never   Smokeless tobacco: Never  Vaping Use   Vaping Use: Never used  Substance and Sexual Activity   Alcohol use: Yes    Alcohol/week: 14.0 standard  drinks of alcohol    Types: 14 Glasses of wine per week    Comment: 2 a day   Drug use: No   Sexual activity: Not on file  Other Topics Concern   Not on file  Social History Narrative   Not on file   Social Determinants of Health   Financial Resource Strain: Low Risk  (04/19/2023)   Overall Financial Resource Strain (CARDIA)    Difficulty of Paying Living Expenses: Not hard at all  Food Insecurity: No Food Insecurity (04/19/2023)   Hunger Vital Sign    Worried About Running Out of Food in the Last Year: Never true    Ran Out of Food in the Last Year: Never true  Transportation Needs: No Transportation Needs (04/19/2023)   PRAPARE - Administrator, Civil Service (Medical): No    Lack of Transportation (Non-Medical): No  Physical Activity: Sufficiently Active (04/19/2023)   Exercise Vital Sign    Days of Exercise per Week: 5 days    Minutes of Exercise per Session: 60 min  Stress: No Stress Concern Present (04/19/2023)   [employer] of Occupational Health - Occupational Stress Questionnaire    Feeling of Stress : Not at all  Social Connections: Moderately Integrated (04/19/2023)   Social Connection and Isolation Panel [NHANES]    Frequency of Communication with Friends and Family: More than three times a week    Frequency of Social Gatherings with Friends and Family: Once a week    Attends Religious Services: Never    Database administrator or Organizations: Yes    Attends Engineer, structural: More than 4 times per year    Marital Status: Married    Tobacco Counseling Counseling given: Not Answered   Clinical Intake:  Pre-visit preparation completed: Yes  Pain : No/denies pain     BMI - recorded: 28.5 Nutritional Status: BMI 25 -29 Overweight Nutritional Risks: None Diabetes: No  How often do you need to have someone help you when you read instructions, pamphlets, or other written materials from your doctor or pharmacy?: 1 -  Never  Diabetic?no  Interpreter Needed?: No  Comments: lives with husband Information entered by :: B.Donzel Romack,LPN   Activities of Daily Living    04/19/2023    8:30 AM 01/06/2023    9:47 AM  In your present state of health, do you have any difficulty performing the following activities:  Hearing? 0 0  Vision? 1 0  Difficulty concentrating or making decisions? 0 0  Walking or climbing stairs? 0 0  Dressing or bathing? 0 0  Doing errands, shopping? 0 0  Preparing Food and eating ? N   Using the Toilet? N   In the past six months, have you accidently leaked urine? N   Do you have problems with loss of bowel control? N   Managing your Medications? N  Managing your Finances? N   Housekeeping or managing your Housekeeping? N     Patient Care Team: Alfredia Ferguson, PA-C as PCP - General (Physician Assistant) Bud Face, MD as Referring Physician (Otolaryngology) Thurnell Garbe, OD (Optometry)  Indicate any recent Medical Services you may have received from other than [facility] providers in the past year (date may be approximate).     Assessment:   This is a routine wellness examination for Miamarie.  Hearing/Vision screen Hearing Screening - Comments:: Adequate hearing Vision Screening - Comments:: Inadequate vision in rt eye;lft eye cataract surgery:vision good;glasses for reading Dr Tilda Burrow   Dietary issues and exercise activities discussed: Current Exercise Habits: Home exercise routine, Type of exercise: walking (gardening), Time (Minutes): > 60, Frequency (Times/Week): 5, Weekly Exercise (Minutes/Week): 0, Intensity: Mild, Exercise limited by: None identified   Goals Addressed   None    Depression Screen    04/19/2023    8:27 AM 01/06/2023    9:47 AM 10/19/2022    8:45 AM 07/15/2022    3:03 PM 04/16/2022    8:21 AM 09/23/2020   10:04 AM 09/21/2019    1:23 PM  PHQ 2/9 Scores  PHQ - 2 Score 0 0 0 0 0 0 0  PHQ- 9 Score  0 1 3 0      Fall Risk    04/19/2023     8:24 AM 01/06/2023    9:47 AM 10/19/2022    8:45 AM 07/15/2022    3:02 PM 04/16/2022    8:25 AM  Fall Risk   Falls in the past year? 0 0 0 0 0  Number falls in past yr: 0 0 0 0 0  Injury with Fall? 0 0 0 0 0  Risk for fall due to : No Fall Risks   No Fall Risks No Fall Risks  Follow up Education provided;Falls prevention discussed   Falls evaluation completed Falls evaluation completed    FALL RISK PREVENTION PERTAINING TO THE HOME:  Any stairs in or around the home? Yes  If so, are there any without handrails? Yes  Home free of loose throw rugs in walkways, pet beds, electrical cords, etc? Yes  Adequate lighting in your home to reduce risk of falls? Yes   ASSISTIVE DEVICES UTILIZED TO PREVENT FALLS:  Life alert? No  Use of a cane, walker or w/c? No  Grab bars in the bathroom? Yes  Shower chair or bench in shower? No  Elevated toilet seat or a handicapped toilet? Yes   Cognitive Function:        04/19/2023    8:40 AM 04/16/2022    8:27 AM 09/23/2020   10:09 AM 09/21/2019    1:27 PM 08/17/2018    9:09 AM  6CIT Screen  What Year? 0 points 0 points 0 points 0 points 0 points  What month? 0 points 0 points 0 points 0 points 0 points  What time? 0 points 0 points 0 points 0 points 0 points  Count back from 20 0 points 0 points 0 points 0 points 0 points  Months in reverse 0 points 0 points 0 points 0 points 0 points  Repeat phrase 0 points 0 points 2 points 2 points 2 points  Total Score 0 points 0 points 2 points 2 points 2 points    Immunizations Immunization History  Administered Date(s) Administered   Fluad Quad(high Dose 65+) 07/26/2019, 09/25/2021   Influenza Inj Mdck Quad Pf 09/26/2022   Influenza, High Dose Seasonal PF  08/23/2017, 08/17/2018, 08/05/2020   Influenza,inj,Quad PF,6+ Mos 08/08/2014, 08/14/2015, 08/14/2016   PFIZER(Purple Top)SARS-COV-2 Vaccination 12/15/2019, 01/05/2020, 06/29/2020   Pneumococcal Conjugate-13 08/23/2017   Pneumococcal Polysaccharide-23  08/08/2014, 09/29/2019   Td 02/14/2005   Zoster Recombinat (Shingrix) 08/23/2017, 11/05/2017   Zoster, Live 12/18/2011    TDAP status: Up to date  Flu Vaccine status: Up to date  Pneumococcal vaccine status: Up to date  Covid-19 vaccine status: Completed vaccines  Qualifies for Shingles Vaccine? Yes   Zostavax completed Yes   Shingrix Completed?: Yes  Screening Tests Health Maintenance  Topic Date Due   DTaP/Tdap/Td (2 - Tdap) 02/15/2015   COVID-19 Vaccine (4 - 2023-24 season) 07/17/2022   INFLUENZA VACCINE  06/17/2023   DEXA SCAN  11/25/2023   MAMMOGRAM  12/15/2023   Medicare Annual Wellness (AWV)  04/18/2024   Colonoscopy  10/11/2028   Pneumonia Vaccine 3+ Years old  Completed   Hepatitis C Screening  Completed   Zoster Vaccines- Shingrix  Completed   HPV VACCINES  Aged Out    Health Maintenance  Health Maintenance Due  Topic Date Due   DTaP/Tdap/Td (2 - Tdap) 02/15/2015   COVID-19 Vaccine (4 - 2023-24 season) 07/17/2022    Colorectal cancer screening: Type of screening: Cologuard. Completed no. Repeat every 3 years  Mammogram status: Completed yes. Repeat every year  Bone Density status: Completed yes. Results reflect: Bone density results: OSTEOPENIA. Repeat every 3 years.  Lung Cancer Screening: (Low Dose CT Chest recommended if Age 6-80 years, 30 pack-year currently smoking OR have quit w/in 15years.) does not qualify.   Lung Cancer Screening Referral: no  Additional Screening:  Hepatitis C Screening: does not qualify; Completed yes  Vision Screening: Recommended annual ophthalmology exams for early detection of glaucoma and other disorders of the eye. Is the patient up to date with their annual eye exam?  Yes  Who is the provider or what is the name of the office in which the patient attends annual eye exams? Dr Tilda Burrow If pt is not established with a provider, would they like to be referred to a provider to establish care? No .   Dental  Screening: Recommended annual dental exams for proper oral hygiene  Community Resource Referral / Chronic Care Management: CRR required this visit?  No   CCM required this visit?  No      Plan:     I have personally reviewed and noted the following in the patient's chart:   Medical and social history Use of alcohol, tobacco or illicit drugs  Current medications and supplements including opioid prescriptions. Patient is not currently taking opioid prescriptions. Functional ability and status Nutritional status Physical activity Advanced directives List of other physicians Hospitalizations, surgeries, and ER visits in previous 12 months Vitals Screenings to include cognitive, depression, and falls Referrals and appointments  In addition, I have reviewed and discussed with patient certain preventive protocols, quality metrics, and best practice recommendations. A written personalized care plan for preventive services as well as general preventive health recommendations were provided to patient.     Sue Lush, LPN   11/21/1094   Nurse Notes: The patient states she is doing well and has no concerns or questions at this time.

## 2023-04-19 NOTE — Patient Instructions (Signed)
[name] , Thank you for taking time to come for your Medicare Wellness Visit. I appreciate your ongoing commitment to your health goals. Please review the following plan we discussed and let me know if I can assist you in the future.   These are the goals we discussed:  Goals      DIET - EAT MORE FRUITS AND VEGETABLES     DIET - INCREASE WATER INTAKE     Recommend increasing water intake to 6-8 8 oz glasses a day.         This is a list of the screening recommended for you and due dates:  Health Maintenance  Topic Date Due   DTaP/Tdap/Td vaccine (2 - Tdap) 02/15/2015   COVID-19 Vaccine (4 - 2023-24 season) 07/17/2022   Flu Shot  06/17/2023   DEXA scan (bone density measurement)  11/25/2023   Mammogram  12/15/2023   Medicare Annual Wellness Visit  04/18/2024   Colon Cancer Screening  10/11/2028   Pneumonia Vaccine  Completed   Hepatitis C Screening  Completed   Zoster (Shingles) Vaccine  Completed   HPV Vaccine  Aged Out    Advanced directives: yes  Conditions/risks identified: non  Next appointment: Follow up in one year for your annual wellness visit 04/19/2024 @8 :15am tlephone   Preventive Care 65 Years and Older, Female Preventive care refers to lifestyle choices and visits with your health care provider that can promote health and wellness. What does preventive care include? A yearly physical exam. This is also called an annual well check. Dental exams once or twice a year. Routine eye exams. Ask your health care provider how often you should have your eyes checked. Personal lifestyle choices, including: Daily care of your teeth and gums. Regular physical activity. Eating a healthy diet. Avoiding tobacco and drug use. Limiting alcohol use. Practicing safe sex. Taking low-dose aspirin every day. Taking vitamin and mineral supplements as recommended by your health care provider. What happens during an annual well check? The services and screenings done by  your health care provider during your annual well check will depend on your age, overall health, lifestyle risk factors, and family history of disease. Counseling  Your health care provider may ask you questions about your: Alcohol use. Tobacco use. Drug use. Emotional well-being. Home and relationship well-being. Sexual activity. Eating habits. History of falls. Memory and ability to understand (cognition). Work and work Astronomer. Reproductive health. Screening  You may have the following tests or measurements: Height, weight, and BMI. Blood pressure. Lipid and cholesterol levels. These may be checked every 5 years, or more frequently if you are over 74 years old. Skin check. Lung cancer screening. You may have this screening every year starting at age 42 if you have a 30-pack-year history of smoking and currently smoke or have quit within the past 15 years. Fecal occult blood test (FOBT) of the stool. You may have this test every year starting at age 67. Flexible sigmoidoscopy or colonoscopy. You may have a sigmoidoscopy every 5 years or a colonoscopy every 10 years starting at age 87. Hepatitis C blood test. Hepatitis B blood test. Sexually transmitted disease (STD) testing. Diabetes screening. This is done by checking your blood sugar (glucose) after you have not eaten for a while (fasting). You may have this done every 1-3 years. Bone density scan. This is done to screen for osteoporosis. You may have this done starting at age 33. Mammogram. This may be done every 1-2 years. Talk  to your health care provider about how often you should have regular mammograms. Talk with your health care provider about your test results, treatment options, and if necessary, the need for more tests. Vaccines  Your health care provider may recommend certain vaccines, such as: Influenza vaccine. This is recommended every year. Tetanus, diphtheria, and acellular pertussis (Tdap, Td) vaccine. You  may need a Td booster every 10 years. Zoster vaccine. You may need this after age 50. Pneumococcal 13-valent conjugate (PCV13) vaccine. One dose is recommended after age 97. Pneumococcal polysaccharide (PPSV23) vaccine. One dose is recommended after age 96. Talk to your health care provider about which screenings and vaccines you need and how often you need them. This information is not intended to replace advice given to you by your health care provider. Make sure you discuss any questions you have with your health care provider. Document Released: 11/29/2015 Document Revised: 07/22/2016 Document Reviewed: 09/03/2015 Elsevier Interactive Patient Education  2017 [employer].  Fall Prevention in the Home Falls can cause injuries. They can happen to people of all ages. There are many things you can do to make your home safe and to help prevent falls. What can I do on the outside of my home? Regularly fix the edges of walkways and driveways and fix any cracks. Remove anything that might make you trip as you walk through a door, such as a raised step or threshold. Trim any bushes or trees on the path to your home. Use bright outdoor lighting. Clear any walking paths of anything that might make someone trip, such as rocks or tools. Regularly check to see if handrails are loose or broken. Make sure that both sides of any steps have handrails. Any raised decks and porches should have guardrails on the edges. Have any leaves, snow, or ice cleared regularly. Use sand or salt on walking paths during winter. Clean up any spills in your garage right away. This includes oil or grease spills. What can I do in the bathroom? Use night lights. Install grab bars by the toilet and in the tub and shower. Do not use towel bars as grab bars. Use non-skid mats or decals in the tub or shower. If you need to sit down in the shower, use a plastic, non-slip stool. Keep the floor dry. Clean up any water that spills  on the floor as soon as it happens. Remove soap buildup in the tub or shower regularly. Attach bath mats securely with double-sided non-slip rug tape. Do not have throw rugs and other things on the floor that can make you trip. What can I do in the bedroom? Use night lights. Make sure that you have a light by your bed that is easy to reach. Do not use any sheets or blankets that are too big for your bed. They should not hang down onto the floor. Have a firm chair that has side arms. You can use this for support while you get dressed. Do not have throw rugs and other things on the floor that can make you trip. What can I do in the kitchen? Clean up any spills right away. Avoid walking on wet floors. Keep items that you use a lot in easy-to-reach places. If you need to reach something above you, use a strong step stool that has a grab bar. Keep electrical cords out of the way. Do not use floor polish or wax that makes floors slippery. If you must use wax, use non-skid floor wax. Do  not have throw rugs and other things on the floor that can make you trip. What can I do with my stairs? Do not leave any items on the stairs. Make sure that there are handrails on both sides of the stairs and use them. Fix handrails that are broken or loose. Make sure that handrails are as long as the stairways. Check any carpeting to make sure that it is firmly attached to the stairs. Fix any carpet that is loose or worn. Avoid having throw rugs at the top or bottom of the stairs. If you do have throw rugs, attach them to the floor with carpet tape. Make sure that you have a light switch at the top of the stairs and the bottom of the stairs. If you do not have them, ask someone to add them for you. What else can I do to help prevent falls? Wear shoes that: Do not have high heels. Have rubber bottoms. Are comfortable and fit you well. Are closed at the toe. Do not wear sandals. If you use a stepladder: Make  sure that it is fully opened. Do not climb a closed stepladder. Make sure that both sides of the stepladder are locked into place. Ask someone to hold it for you, if possible. Clearly mark and make sure that you can see: Any grab bars or handrails. First and last steps. Where the edge of each step is. Use tools that help you move around (mobility aids) if they are needed. These include: Canes. Walkers. Scooters. Crutches. Turn on the lights when you go into a dark area. Replace any light bulbs as soon as they burn out. Set up your furniture so you have a clear path. Avoid moving your furniture around. If any of your floors are uneven, fix them. If there are any pets around you, be aware of where they are. Review your medicines with your doctor. Some medicines can make you feel dizzy. This can increase your chance of falling. Ask your doctor what other things that you can do to help prevent falls. This information is not intended to replace advice given to you by your health care provider. Make sure you discuss any questions you have with your health care provider. Document Released: 08/29/2009 Document Revised: 04/09/2016 Document Reviewed: 12/07/2014 Elsevier Interactive Patient Education  2017 [employer].

## 2023-04-30 DIAGNOSIS — Z1211 Encounter for screening for malignant neoplasm of colon: Secondary | ICD-10-CM | POA: Diagnosis not present

## 2023-05-06 ENCOUNTER — Other Ambulatory Visit: Payer: Self-pay | Admitting: Physician Assistant

## 2023-05-06 DIAGNOSIS — R195 Other fecal abnormalities: Secondary | ICD-10-CM

## 2023-05-06 LAB — COLOGUARD: COLOGUARD: POSITIVE — AB

## 2023-05-14 ENCOUNTER — Encounter: Payer: Self-pay | Admitting: *Deleted

## 2023-05-26 DIAGNOSIS — S90562A Insect bite (nonvenomous), left ankle, initial encounter: Secondary | ICD-10-CM | POA: Diagnosis not present

## 2023-05-26 DIAGNOSIS — D225 Melanocytic nevi of trunk: Secondary | ICD-10-CM | POA: Diagnosis not present

## 2023-05-26 DIAGNOSIS — Z1283 Encounter for screening for malignant neoplasm of skin: Secondary | ICD-10-CM | POA: Diagnosis not present

## 2023-07-20 ENCOUNTER — Ambulatory Visit (INDEPENDENT_AMBULATORY_CARE_PROVIDER_SITE_OTHER): Payer: Medicare PPO | Admitting: Family Medicine

## 2023-07-20 DIAGNOSIS — Z23 Encounter for immunization: Secondary | ICD-10-CM

## 2023-07-20 NOTE — Progress Notes (Signed)
Flu shot only

## 2023-07-29 DIAGNOSIS — J301 Allergic rhinitis due to pollen: Secondary | ICD-10-CM | POA: Diagnosis not present

## 2023-07-29 DIAGNOSIS — H6063 Unspecified chronic otitis externa, bilateral: Secondary | ICD-10-CM | POA: Diagnosis not present

## 2023-07-29 DIAGNOSIS — H6123 Impacted cerumen, bilateral: Secondary | ICD-10-CM | POA: Diagnosis not present

## 2023-10-06 ENCOUNTER — Other Ambulatory Visit: Payer: Self-pay | Admitting: Physician Assistant

## 2023-10-17 DIAGNOSIS — S8392XA Sprain of unspecified site of left knee, initial encounter: Secondary | ICD-10-CM | POA: Diagnosis not present

## 2023-10-22 ENCOUNTER — Ambulatory Visit: Payer: Medicare PPO | Admitting: Family Medicine

## 2023-10-22 VITALS — BP 128/80 | HR 58 | Temp 97.8°F | Ht 69.0 in | Wt 200.0 lb

## 2023-10-22 DIAGNOSIS — E559 Vitamin D deficiency, unspecified: Secondary | ICD-10-CM | POA: Diagnosis not present

## 2023-10-22 DIAGNOSIS — Z0001 Encounter for general adult medical examination with abnormal findings: Secondary | ICD-10-CM | POA: Diagnosis not present

## 2023-10-22 DIAGNOSIS — M25562 Pain in left knee: Secondary | ICD-10-CM

## 2023-10-22 DIAGNOSIS — R7303 Prediabetes: Secondary | ICD-10-CM

## 2023-10-22 DIAGNOSIS — Z8639 Personal history of other endocrine, nutritional and metabolic disease: Secondary | ICD-10-CM

## 2023-10-22 DIAGNOSIS — R195 Other fecal abnormalities: Secondary | ICD-10-CM

## 2023-10-22 DIAGNOSIS — Z862 Personal history of diseases of the blood and blood-forming organs and certain disorders involving the immune mechanism: Secondary | ICD-10-CM

## 2023-10-22 DIAGNOSIS — Z0000 Encounter for general adult medical examination without abnormal findings: Secondary | ICD-10-CM

## 2023-10-22 DIAGNOSIS — R252 Cramp and spasm: Secondary | ICD-10-CM

## 2023-10-22 DIAGNOSIS — M8580 Other specified disorders of bone density and structure, unspecified site: Secondary | ICD-10-CM

## 2023-10-22 DIAGNOSIS — E782 Mixed hyperlipidemia: Secondary | ICD-10-CM

## 2023-10-22 NOTE — Assessment & Plan Note (Signed)
Reports foot cramps occurring 2-3 times a week, even when not walking. Possible electrolyte imbalance or vitamin deficiency. Discussed the importance of checking electrolyte and vitamin levels to identify any deficiencies. - Order metabolic panel - Order magnesium level - Order B12 level - Order iron panel - Order complete blood count

## 2023-10-22 NOTE — Progress Notes (Signed)
Complete physical exam   Patient: Allison [name]   DOB: [birth year]   72 y.o. Female  MRN: [id number] Visit Date: 10/22/2023  Today's healthcare provider: Sherlyn Hay, DO   Chief Complaint  Patient presents with   Annual Exam   Subjective    Allison [name] is a 72 y.o. female who presents today for a complete physical exam.  She reports consuming a  fairly healthy  diet (lots of vegetables and salads, but with fried chicken). Home exercise routine includes walks 5-10 miles per day. She generally feels well. She reports sleeping somewhat poorly due to her recent knee injury. She does not have additional problems to discuss today.   Last mAWV: 04/19/2023  HPI  Cologuard positive 04/30/2023; followed up with GI for colonoscopy? No. Mammogram 12/06/2022; BI-RADS 1 DEXA scan 11/24/2021 right femoral T-score -1.9; osteopenia  The patient, Allison [name], presented for an annual exam and blood work. She has a history of prediabetes, with the last recorded A1c level at 5.7. She also has history of a positive Cologuard test in June, but due to personal circumstances, a follow-up was not pursued.  She reported a recent leg injury, specifically a strained muscle in the medial left knee, which occurred due to wearing an old pair of shoes while maintaining a high level of physical activity, walking 5-10 miles daily. She sought care at [facility], where a brace was provided and ibuprofen 600mg  q6h was prescribed. However, she reported persistent discomfort and sleep disturbances due to the pain. She also reported experiencing foot cramps 2-3 times a week, even when not walking regularly.  Her diet was discussed, which consists mainly of vegetables, chicken, salads, and occasional fried foods. She reported a change in dietary habits since March due to her spouse's medical condition. She also reported a history of hyperthyroidism in childhood, but no recent thyroid issues. Her family history includes a  sister who died of breast cancer and a father who had a stroke.  She declined a tetanus vaccine due to a previous adverse reaction causing prolonged arm ache. She also reported a history of vitamin D deficiency and is currently taking supplements. She has been donating blood regularly but reported a recent incident where her hemoglobin was found to be low during a donation.   Past Medical History:  Diagnosis Date   Medical history non-contributory    Rosacea    Past Surgical History:  Procedure Laterality Date   BREAST BIOPSY Left 2002   benign   CATARACT EXTRACTION W/PHACO Right 07/06/2017   Procedure: CATARACT EXTRACTION PHACO AND INTRAOCULAR LENS PLACEMENT (IOC);  Surgeon: Galen Manila, MD;  Location: [facility] ORS;  Service: Ophthalmology;  Laterality: Right;  [place] 00:34.2 AP% 12.7 CDE 4.33 Fluid Pack lot # [id number]   CATARACT EXTRACTION W/PHACO Left 10/03/2019   Procedure: CATARACT EXTRACTION PHACO AND INTRAOCULAR LENS PLACEMENT (IOC) LEFT;  Surgeon: Galen Manila, MD;  Location: [facility];  Service: Ophthalmology;  Laterality: Left;  CDE 6.44 [place] 0:47.3   COLONOSCOPY WITH PROPOFOL N/A 10/11/2018   Procedure: COLONOSCOPY WITH PROPOFOL;  Surgeon: Midge Minium, MD;  Location: [facility];  Service: Endoscopy;  Laterality: N/A;   Social History   Socioeconomic History   Marital status: Married    Spouse name: Not on file   Number of children: 0   Years of education: Not on file   Highest education level: Master's degree (e.g., MA, MS, MEng, MEd, MSW, MBA)  Occupational History   Occupation: business  owner  Tobacco Use   Smoking status: Never   Smokeless tobacco: Never  Vaping Use   Vaping status: Never Used  Substance and Sexual Activity   Alcohol use: Yes    Alcohol/week: 14.0 standard drinks of alcohol    Types: 14 Glasses of wine per week    Comment: 2 a day   Drug use: No   Sexual activity: Not on file  Other Topics Concern   Not on file  Social  History Narrative   Not on file   Social Drivers of Health   Financial Resource Strain: Low Risk  (10/21/2023)   Overall Financial Resource Strain (CARDIA)    Difficulty of Paying Living Expenses: Not hard at all  Food Insecurity: No Food Insecurity (10/21/2023)   Hunger Vital Sign    Worried About Running Out of Food in the Last Year: Never true    Ran Out of Food in the Last Year: Never true  Transportation Needs: No Transportation Needs (10/21/2023)   PRAPARE - Administrator, Civil Service (Medical): No    Lack of Transportation (Non-Medical): No  Physical Activity: Sufficiently Active (10/21/2023)   Exercise Vital Sign    Days of Exercise per Week: 5 days    Minutes of Exercise per Session: 60 min  Stress: No Stress Concern Present (10/21/2023)   [employer] of Occupational Health - Occupational Stress Questionnaire    Feeling of Stress : Only a little  Social Connections: Unknown (10/21/2023)   Social Connection and Isolation Panel [NHANES]    Frequency of Communication with Friends and Family: Once a week    Frequency of Social Gatherings with Friends and Family: Once a week    Attends Religious Services: Patient declined    Active Member of Clubs or Organizations: Yes    Attends Engineer, structural: More than 4 times per year    Marital Status: Married  Catering manager Violence: Not At Risk (04/19/2023)   Humiliation, Afraid, Rape, and Kick questionnaire    Fear of Current or Ex-Partner: No    Emotionally Abused: No    Physically Abused: No    Sexually Abused: No   Family Status  Relation Name Status   Mother  Deceased at age 71       bladder cancer   Father  Deceased at age 45       CVA   Sister  Deceased at age 42       breast cancer   Brother  Alive   Brother  Alive  No partnership data on file   Family History  Problem Relation Age of Onset   Bladder Cancer Mother    Breast cancer Mother    Stroke Father    Hyperlipidemia Father     Breast cancer Sister 30   Kidney Stones Brother    Hyperlipidemia Brother    Kidney Stones Brother    Diverticulosis Brother    Allergies  Allergen Reactions   Strawberry Extract     difficulty breathing   Tetracycline Rash    Patient Care Team: Cayden Rautio, Monico Blitz, DO as PCP - General (Family Medicine) Bud Face, MD as Referring Physician (Otolaryngology) Thurnell Garbe, OD (Optometry)   Medications: Outpatient Medications Prior to Visit  Medication Sig   meloxicam (MOBIC) 7.5 MG tablet Take 1 tablet by mouth 2 (two) times daily with a meal.   Vitamin D, Ergocalciferol, (DRISDOL) 1.25 MG (50000 UNIT) CAPS capsule TAKE 1 CAPSULE BY MOUTH EVERY 7  DAYS   [DISCONTINUED] cyclobenzaprine (FLEXERIL) 10 MG tablet Take 1 tablet (10 mg total) by mouth 3 (three) times daily as needed for muscle spasms. (Patient not taking: Reported on 04/19/2023)   No facility-administered medications prior to visit.    Review of Systems  Constitutional:  Negative for chills, fatigue and fever.  HENT:  Negative for congestion, ear pain, rhinorrhea, sneezing and sore throat.   Eyes: Negative.   Respiratory:  Negative for cough, shortness of breath and wheezing.   Cardiovascular:  Negative for chest pain and leg swelling.  Gastrointestinal:  Negative for abdominal pain, constipation, diarrhea, nausea and vomiting.  Musculoskeletal:  Positive for arthralgias (left knee). Negative for back pain, gait problem and joint swelling.  Skin:  Negative for rash.  Neurological: Negative.  Negative for tremors and weakness.  Hematological:  Negative for adenopathy.  Psychiatric/Behavioral: Negative.  Negative for behavioral problems, confusion and dysphoric mood. The patient is not nervous/anxious and is not hyperactive.       Objective    BP 128/80 (BP Location: Right Arm, Patient Position: Sitting, Cuff Size: Normal)   Pulse (!) 58   Temp 97.8 F (36.6 C) (Oral)   Ht 5\' 9"  (1.753 m)   Wt 200 lb (90.7  kg)   SpO2 100%   BMI 29.53 kg/m    Physical Exam Vitals and nursing note reviewed.  Constitutional:      General: She is awake.     Appearance: Normal appearance.  HENT:     Head: Normocephalic and atraumatic.     Right Ear: Tympanic membrane, ear canal and external ear normal.     Left Ear: Tympanic membrane, ear canal and external ear normal.     Nose: Nose normal.     Mouth/Throat:     Mouth: Mucous membranes are moist.     Pharynx: Oropharynx is clear. No oropharyngeal exudate or posterior oropharyngeal erythema.  Eyes:     General: No scleral icterus.    Extraocular Movements: Extraocular movements intact.     Conjunctiva/sclera: Conjunctivae normal.     Pupils: Pupils are equal, round, and reactive to light.  Neck:     Thyroid: No thyromegaly or thyroid tenderness.  Cardiovascular:     Rate and Rhythm: Normal rate and regular rhythm.     Pulses: Normal pulses.     Heart sounds: Normal heart sounds.  Pulmonary:     Effort: Pulmonary effort is normal. No tachypnea, bradypnea or respiratory distress.     Breath sounds: Normal breath sounds. No stridor. No wheezing, rhonchi or rales.  Abdominal:     General: Bowel sounds are normal. There is no distension.     Palpations: Abdomen is soft. There is no mass.     Tenderness: There is no abdominal tenderness. There is no guarding.     Hernia: No hernia is present.  Musculoskeletal:     Cervical back: Normal range of motion and neck supple.     Right lower leg: Edema (trace) present.     Left lower leg: Edema (trace) present.     Comments: Knee brace in place to left knee  Lymphadenopathy:     Cervical: No cervical adenopathy.  Skin:    General: Skin is warm and dry.  Neurological:     Mental Status: She is alert and oriented to person, place, and time. Mental status is at baseline.  Psychiatric:        Mood and Affect: Mood normal.  Behavior: Behavior normal.      Last depression screening scores     04/19/2023    8:27 AM 01/06/2023    9:47 AM 10/19/2022    8:45 AM  PHQ 2/9 Scores  PHQ - 2 Score 0 0 0  PHQ- 9 Score  0 1   Last fall risk screening    10/22/2023   10:46 AM  Fall Risk   Falls in the past year? 0  Number falls in past yr: 0  Injury with Fall? 0  Risk for fall due to : No Fall Risks   Last Audit-C alcohol use screening    10/21/2023    4:34 PM  Alcohol Use Disorder Test (AUDIT)  1. How often do you have a drink containing alcohol? 4  2. How many drinks containing alcohol do you have on a typical day when you are drinking? 0  3. How often do you have six or more drinks on one occasion? 0  AUDIT-C Score 4   4. How often during the last year have you found that you were not able to stop drinking once you had started? 0  5. How often during the last year have you failed to do what was normally expected from you because of drinking? 0  6. How often during the last year have you needed a first drink in the morning to get yourself going after a heavy drinking session? 0  7. How often during the last year have you had a feeling of guilt of remorse after drinking? 0  8. How often during the last year have you been unable to remember what happened the night before because you had been drinking? 0  9. Have you or someone else been injured as a result of your drinking? 0  10. Has a relative or friend or a doctor or another health worker been concerned about your drinking or suggested you cut down? 0  Alcohol Use Disorder Identification Test Final Score (AUDIT) 4      Patient-reported   A score of 3 or more in women, and 4 or more in men indicates increased risk for alcohol abuse, EXCEPT if all of the points are from question 1   Results for orders placed or performed in visit on 10/22/23  Comprehensive metabolic panel  Result Value Ref Range   Glucose 93 70 - 99 mg/dL   BUN 11 8 - 27 mg/dL   Creatinine, Ser 4.09 0.57 - 1.00 mg/dL   eGFR 93 >81 XB/JYN/8.29   BUN/Creatinine  Ratio 16 12 - 28   Sodium 138 134 - 144 mmol/L   Potassium 4.8 3.5 - 5.2 mmol/L   Chloride 100 96 - 106 mmol/L   CO2 24 20 - 29 mmol/L   Calcium 9.4 8.7 - 10.3 mg/dL   Total Protein 6.7 6.0 - 8.5 g/dL   Albumin 4.6 3.8 - 4.8 g/dL   Globulin, Total 2.1 1.5 - 4.5 g/dL   Bilirubin Total 0.5 0.0 - 1.2 mg/dL   Alkaline Phosphatase 62 44 - 121 IU/L   AST 18 0 - 40 IU/L   ALT 10 0 - 32 IU/L  Hemoglobin A1c  Result Value Ref Range   Hgb A1c MFr Bld 5.7 (H) 4.8 - 5.6 %   Est. average glucose Bld gHb Est-mCnc 117 mg/dL  Lipid panel  Result Value Ref Range   Cholesterol, Total 204 (H) 100 - 199 mg/dL   Triglycerides 58 0 - 149 mg/dL  HDL 76 >39 mg/dL   VLDL Cholesterol Cal 11 5 - 40 mg/dL   LDL Chol Calc (NIH) 696 (H) 0 - 99 mg/dL   Chol/HDL Ratio 2.7 0.0 - 4.4 ratio  VITAMIN D 25 Hydroxy (Vit-D Deficiency, Fractures)  Result Value Ref Range   Vit D, 25-Hydroxy 54.5 30.0 - 100.0 ng/mL  TSH  Result Value Ref Range   TSH 1.990 0.450 - 4.500 uIU/mL  Magnesium  Result Value Ref Range   Magnesium 2.1 1.6 - 2.3 mg/dL  E95  Result Value Ref Range   Vitamin B-12 243 232 - 1,245 pg/mL  Vitamin B1  Result Value Ref Range   Thiamine 100.3 66.5 - 200.0 nmol/L  Iron, TIBC and Ferritin Panel  Result Value Ref Range   Total Iron Binding Capacity 386 250 - 450 ug/dL   UIBC 284 132 - 440 ug/dL   Iron 81 27 - 102 ug/dL   Iron Saturation 21 15 - 55 %   Ferritin 69 15 - 150 ng/mL  CBC  Result Value Ref Range   WBC 4.7 3.4 - 10.8 x10E3/uL   RBC 3.98 3.77 - 5.28 x10E6/uL   Hemoglobin 12.4 11.1 - 15.9 g/dL   Hematocrit 72.5 36.6 - 46.6 %   MCV 93 79 - 97 fL   MCH 31.2 26.6 - 33.0 pg   MCHC 33.4 31.5 - 35.7 g/dL   RDW 44.0 34.7 - 42.5 %   Platelets 341 150 - 450 x10E3/uL    Assessment & Plan    Routine Health Maintenance and Physical Exam  Exercise Activities and Dietary recommendations  Goals      DIET - EAT MORE FRUITS AND VEGETABLES     DIET - INCREASE WATER INTAKE     Recommend  increasing water intake to 6-8 8 oz glasses a day.         Immunization History  Administered Date(s) Administered   Fluad Quad(high Dose 65+) 07/26/2019, 09/25/2021   Fluad Trivalent(High Dose 65+) 07/20/2023   Influenza Inj Mdck Quad Pf 09/26/2022   Influenza, High Dose Seasonal PF 08/23/2017, 08/17/2018, 08/05/2020   Influenza,inj,Quad PF,6+ Mos 08/08/2014, 08/14/2015, 08/14/2016   PFIZER(Purple Top)SARS-COV-2 Vaccination 12/15/2019, 01/05/2020, 06/29/2020   Pneumococcal Conjugate-13 08/23/2017   Pneumococcal Polysaccharide-23 08/08/2014, 09/29/2019   Td 02/14/2005   Zoster Recombinant(Shingrix) 08/23/2017, 11/05/2017   Zoster, Live 12/18/2011    Health Maintenance  Topic Date Due   COVID-19 Vaccine (4 - 2024-25 season) 07/17/2024 (Originally 07/18/2023)   DTaP/Tdap/Td (2 - Tdap) 10/21/2024 (Originally 02/15/2015)   DEXA SCAN  11/25/2023   MAMMOGRAM  12/15/2023   Medicare Annual Wellness (AWV)  04/18/2024   Colonoscopy  10/11/2028   Pneumonia Vaccine 63+ Years old  Completed   INFLUENZA VACCINE  Completed   Hepatitis C Screening  Completed   Zoster Vaccines- Shingrix  Completed   HPV VACCINES  Aged Out    Discussed health benefits of physical activity, and encouraged her to engage in regular exercise appropriate for her age and condition.   Annual physical exam Assessment & Plan: Routine annual exam. Discussed the need for blood work including metabolic panel, A1c, cholesterol panel, vitamin D, and thyroid level. Patient has prediabetes with a previous A1c of 5.7. Discussed the importance of monitoring these levels to manage prediabetes and overall health. - Order metabolic panel - Order A1c - Order cholesterol panel - Order vitamin D level - Order thyroid level  Orders: -     Comprehensive metabolic panel  Moderate mixed hyperlipidemia not  requiring statin therapy -     Lipid panel  Vitamin D deficiency -     VITAMIN D 25 Hydroxy (Vit-D Deficiency,  Fractures)  Prediabetes -     Hemoglobin A1c  Osteopenia, unspecified location -     DG Bone Density; Future  Medial knee pain, left Assessment & Plan: Strain of the gracilis muscle with possible tendon involvement. Reports pain and difficulty sleeping. Currently using a brace and taking ibuprofen 600 mg four times a day. Pain persists despite treatment. Discussed risks of prolonged NSAID use, including GI ulcers, GI bleeds, and kidney damage. Considered alternative pain management and potential need for physical therapy. - Continue ibuprofen 600 mg four times a day - Consider Tylenol for additional pain relief - Follow up with orthopedic doctor on November 01, 2023 - Consider physical therapy if no improvement   Cramping of feet Assessment & Plan: Reports foot cramps occurring 2-3 times a week, even when not walking. Possible electrolyte imbalance or vitamin deficiency. Discussed the importance of checking electrolyte and vitamin levels to identify any deficiencies. - Order metabolic panel - Order magnesium level - Order B12 level - Order iron panel - Order complete blood count  Orders: -     Magnesium -     Vitamin B12 -     Vitamin B1 -     Iron, TIBC and Ferritin Panel -     CBC  H/O hyperthyroidism -     TSH  History of anemia -     Iron, TIBC and Ferritin Panel -     CBC  Positive colorectal cancer screening using Cologuard test Assessment & Plan: Positive Cologuard test in June. No follow-up due to personal circumstances. Discussed the importance of follow-up as it could indicate polyps or other issues. Explained that a positive Cologuard test necessitates further investigation. - Contact [facility] in [place] for follow-up   General Health Maintenance Follows a relatively healthy diet but consumes fried chicken regularly. Declined tetanus vaccine due to previous adverse reaction. Up-to-date on other vaccinations including pneumonia, shingles, flu, and RSV.  Discussed the importance of maintaining a healthy diet and regular exercise. - Postpone tetanus vaccine to next year - Encourage healthy diet and exercise - Recommend body weight squats to support hips and prevent falls   Follow-up - Schedule mammogram and DEXA scan for January 2025 - Follow up with orthopedic doctor on November 01, 2023 - Contact [facility] in [place] for follow-up on positive Cologuard test.   Return in about 1 year (around 10/21/2024) for CPE.     I discussed the assessment and treatment plan with the patient  The patient was provided an opportunity to ask questions and all were answered. The patient agreed with the plan and demonstrated an understanding of the instructions.   The patient was advised to call back or seek an in-person evaluation if the symptoms worsen or if the condition fails to improve as anticipated.    Sherlyn Hay, DO  [facility] [phone] (phone) [phone] (fax)  [facility]

## 2023-10-22 NOTE — Assessment & Plan Note (Addendum)
Routine annual exam. Discussed the need for blood work including metabolic panel, A1c, cholesterol panel, vitamin D, and thyroid level. Patient has prediabetes with a previous A1c of 5.7. Discussed the importance of monitoring these levels to manage prediabetes and overall health. - Order metabolic panel - Order A1c - Order cholesterol panel - Order vitamin D level - Order thyroid level

## 2023-10-22 NOTE — Patient Instructions (Addendum)
  Contact for colonoscopy: [facility] [address], [place], [place] [address] Phone: [phone]

## 2023-10-26 ENCOUNTER — Encounter: Payer: Medicare PPO | Admitting: Family Medicine

## 2023-10-26 ENCOUNTER — Encounter: Payer: Medicare PPO | Admitting: Physician Assistant

## 2023-10-26 LAB — COMPREHENSIVE METABOLIC PANEL
ALT: 10 [IU]/L (ref 0–32)
AST: 18 [IU]/L (ref 0–40)
Albumin: 4.6 g/dL (ref 3.8–4.8)
Alkaline Phosphatase: 62 [IU]/L (ref 44–121)
BUN/Creatinine Ratio: 16 (ref 12–28)
BUN: 11 mg/dL (ref 8–27)
Bilirubin Total: 0.5 mg/dL (ref 0.0–1.2)
CO2: 24 mmol/L (ref 20–29)
Calcium: 9.4 mg/dL (ref 8.7–10.3)
Chloride: 100 mmol/L (ref 96–106)
Creatinine, Ser: 0.68 mg/dL (ref 0.57–1.00)
Globulin, Total: 2.1 g/dL (ref 1.5–4.5)
Glucose: 93 mg/dL (ref 70–99)
Potassium: 4.8 mmol/L (ref 3.5–5.2)
Sodium: 138 mmol/L (ref 134–144)
Total Protein: 6.7 g/dL (ref 6.0–8.5)
eGFR: 93 mL/min/{1.73_m2} (ref >59)

## 2023-10-26 LAB — HEMOGLOBIN A1C
Est. average glucose Bld gHb Est-mCnc: 117 mg/dL
Hgb A1c MFr Bld: 5.7 % — ABNORMAL HIGH (ref 4.8–5.6)

## 2023-10-26 LAB — VITAMIN D 25 HYDROXY (VIT D DEFICIENCY, FRACTURES): Vit D, 25-Hydroxy: 54.5 ng/mL (ref 30.0–100.0)

## 2023-10-26 LAB — CBC
Hematocrit: 37.1 % (ref 34.0–46.6)
Hemoglobin: 12.4 g/dL (ref 11.1–15.9)
MCH: 31.2 pg (ref 26.6–33.0)
MCHC: 33.4 g/dL (ref 31.5–35.7)
MCV: 93 fL (ref 79–97)
Platelets: 341 10*3/uL (ref 150–450)
RBC: 3.98 x10E6/uL (ref 3.77–5.28)
RDW: 12.1 % (ref 11.7–15.4)
WBC: 4.7 10*3/uL (ref 3.4–10.8)

## 2023-10-26 LAB — VITAMIN B12: Vitamin B-12: 243 pg/mL (ref 232–1245)

## 2023-10-26 LAB — IRON,TIBC AND FERRITIN PANEL
Ferritin: 69 ng/mL (ref 15–150)
Iron Saturation: 21 % (ref 15–55)
Iron: 81 ug/dL (ref 27–139)
Total Iron Binding Capacity: 386 ug/dL (ref 250–450)
UIBC: 305 ug/dL (ref 118–369)

## 2023-10-26 LAB — LIPID PANEL
Chol/HDL Ratio: 2.7 {ratio} (ref 0.0–4.4)
Cholesterol, Total: 204 mg/dL — ABNORMAL HIGH (ref 100–199)
HDL: 76 mg/dL (ref >39)
LDL Chol Calc (NIH): 117 mg/dL — ABNORMAL HIGH (ref 0–99)
Triglycerides: 58 mg/dL (ref 0–149)
VLDL Cholesterol Cal: 11 mg/dL (ref 5–40)

## 2023-10-26 LAB — TSH: TSH: 1.99 u[IU]/mL (ref 0.450–4.500)

## 2023-10-26 LAB — MAGNESIUM: Magnesium: 2.1 mg/dL (ref 1.6–2.3)

## 2023-10-26 LAB — VITAMIN B1: Thiamine: 100.3 nmol/L (ref 66.5–200.0)

## 2023-10-29 DIAGNOSIS — H35013 Changes in retinal vascular appearance, bilateral: Secondary | ICD-10-CM | POA: Diagnosis not present

## 2023-10-29 DIAGNOSIS — H3129 Other hereditary choroidal dystrophy: Secondary | ICD-10-CM | POA: Diagnosis not present

## 2023-10-29 DIAGNOSIS — H524 Presbyopia: Secondary | ICD-10-CM | POA: Diagnosis not present

## 2023-10-29 DIAGNOSIS — D3131 Benign neoplasm of right choroid: Secondary | ICD-10-CM | POA: Diagnosis not present

## 2023-10-29 DIAGNOSIS — H04123 Dry eye syndrome of bilateral lacrimal glands: Secondary | ICD-10-CM | POA: Diagnosis not present

## 2023-11-01 DIAGNOSIS — M2392 Unspecified internal derangement of left knee: Secondary | ICD-10-CM | POA: Diagnosis not present

## 2023-11-01 DIAGNOSIS — M25562 Pain in left knee: Secondary | ICD-10-CM | POA: Diagnosis not present

## 2023-11-09 ENCOUNTER — Encounter: Payer: Self-pay | Admitting: Family Medicine

## 2023-11-09 DIAGNOSIS — R195 Other fecal abnormalities: Secondary | ICD-10-CM | POA: Insufficient documentation

## 2023-11-09 NOTE — Assessment & Plan Note (Signed)
Positive Cologuard test in June. No follow-up due to personal circumstances. Discussed the importance of follow-up as it could indicate polyps or other issues. Explained that a positive Cologuard test necessitates further investigation. - Contact [facility] in [place] for follow-up

## 2023-11-09 NOTE — Assessment & Plan Note (Signed)
Strain of the gracilis muscle with possible tendon involvement. Reports pain and difficulty sleeping. Currently using a brace and taking ibuprofen 600 mg four times a day. Pain persists despite treatment. Discussed risks of prolonged NSAID use, including GI ulcers, GI bleeds, and kidney damage. Considered alternative pain management and potential need for physical therapy. - Continue ibuprofen 600 mg four times a day - Consider Tylenol for additional pain relief - Follow up with orthopedic doctor on November 01, 2023 - Consider physical therapy if no improvement

## 2023-11-24 DIAGNOSIS — M25562 Pain in left knee: Secondary | ICD-10-CM | POA: Diagnosis not present

## 2023-11-24 DIAGNOSIS — M2392 Unspecified internal derangement of left knee: Secondary | ICD-10-CM | POA: Diagnosis not present

## 2023-11-24 DIAGNOSIS — M79672 Pain in left foot: Secondary | ICD-10-CM | POA: Diagnosis not present

## 2023-12-02 ENCOUNTER — Ambulatory Visit: Payer: Medicare PPO | Admitting: Podiatry

## 2023-12-03 DIAGNOSIS — R195 Other fecal abnormalities: Secondary | ICD-10-CM | POA: Diagnosis not present

## 2023-12-03 DIAGNOSIS — Z1231 Encounter for screening mammogram for malignant neoplasm of breast: Secondary | ICD-10-CM | POA: Diagnosis not present

## 2023-12-03 DIAGNOSIS — Z130 Encounter for screening for diseases of the blood and blood-forming organs and certain disorders involving the immune mechanism: Secondary | ICD-10-CM | POA: Diagnosis not present

## 2023-12-03 DIAGNOSIS — Z1211 Encounter for screening for malignant neoplasm of colon: Secondary | ICD-10-CM | POA: Diagnosis not present

## 2023-12-03 DIAGNOSIS — Z13820 Encounter for screening for osteoporosis: Secondary | ICD-10-CM | POA: Diagnosis not present

## 2023-12-03 DIAGNOSIS — Z13220 Encounter for screening for lipoid disorders: Secondary | ICD-10-CM | POA: Diagnosis not present

## 2023-12-03 DIAGNOSIS — Z131 Encounter for screening for diabetes mellitus: Secondary | ICD-10-CM | POA: Diagnosis not present

## 2023-12-03 DIAGNOSIS — Z1159 Encounter for screening for other viral diseases: Secondary | ICD-10-CM | POA: Diagnosis not present

## 2023-12-03 DIAGNOSIS — Z1329 Encounter for screening for other suspected endocrine disorder: Secondary | ICD-10-CM | POA: Diagnosis not present

## 2023-12-06 DIAGNOSIS — M4726 Other spondylosis with radiculopathy, lumbar region: Secondary | ICD-10-CM | POA: Diagnosis not present

## 2023-12-06 DIAGNOSIS — M4316 Spondylolisthesis, lumbar region: Secondary | ICD-10-CM | POA: Diagnosis not present

## 2023-12-07 ENCOUNTER — Ambulatory Visit: Payer: Medicare PPO | Admitting: Podiatry

## 2023-12-07 ENCOUNTER — Encounter: Payer: Self-pay | Admitting: Podiatry

## 2023-12-07 VITALS — Ht 69.0 in | Wt 200.0 lb

## 2023-12-07 DIAGNOSIS — S90122A Contusion of left lesser toe(s) without damage to nail, initial encounter: Secondary | ICD-10-CM | POA: Diagnosis not present

## 2023-12-07 NOTE — Progress Notes (Signed)
  Subjective:  Patient ID: Unknown Allison [name], female    DOB: Feb 16, 1951,  MRN: [id number]  Chief Complaint  Patient presents with   Toe Pain    Pt is here due to toe pain on in her 2nd left toe, pt states she did not injury toe at all, she is a avid walker, gait is off due to pain.     73 y.o. female presents with the above complaint.  Patient presents with complaint of left second digit mild contusion.  Patient states that she does not recall any injury to it she is an avid walker and she wants to make sure everything is okay with the second toe.  It was hurting her.  She denies seeing anyone as prior to it.  She may have had some microtrauma to the nail.  She has been wearing medium depth shoes.  Pain scale now is 4 out of 10.   Review of Systems: Negative except as noted in the HPI. Denies N/V/F/Ch.  Past Medical History:  Diagnosis Date   Medical history non-contributory    Rosacea     Current Outpatient Medications:    meloxicam (MOBIC) 7.5 MG tablet, Take 1 tablet by mouth 2 (two) times daily with a meal., Disp: , Rfl:    Vitamin D, Ergocalciferol, (DRISDOL) 1.25 MG (50000 UNIT) CAPS capsule, TAKE 1 CAPSULE BY MOUTH EVERY 7 DAYS, Disp: 13 capsule, Rfl: 3  Social History   Tobacco Use  Smoking Status Never  Smokeless Tobacco Never    Allergies  Allergen Reactions   Strawberry Extract     difficulty breathing   Tetracycline Rash   Objective:  There were no vitals filed for this visit. Body mass index is 29.53 kg/m. Constitutional Well developed. Well nourished.  Vascular Dorsalis pedis pulses palpable bilaterally. Posterior tibial pulses palpable bilaterally. Capillary refill normal to all digits.  No cyanosis or clubbing noted. Pedal hair growth normal.  Neurologic Normal speech. Oriented to person, place, and time. Epicritic sensation to light touch grossly present bilaterally.  Dermatologic Nails well groomed and normal in appearance. No open wounds. No skin  lesions.  Orthopedic: Left second digit mild contusion.  Mild hematoma noted.  Less than 25% of nail involvement noted.  No active infection noted.  Appears to be dry.   Radiographs: None Assessment:   1. Contusion of second toe of left foot, initial encounter    Plan:  Patient was evaluated and treated and all questions answered.  Left second digit mild microtrauma/nail contusion -All questions and concerns were discussed with the patient -I discussed shoe gear modification extensive detail.  I encouraged her to may go wider depth shoes.  I also discussed with her that if it continues to get worse to come back and see me. -I also encouraged her to tape the toe down to allow it to not rub against the shoe.  No follow-ups on file.

## 2023-12-17 DIAGNOSIS — M47896 Other spondylosis, lumbar region: Secondary | ICD-10-CM | POA: Diagnosis not present

## 2023-12-17 DIAGNOSIS — M5416 Radiculopathy, lumbar region: Secondary | ICD-10-CM | POA: Diagnosis not present

## 2024-02-21 ENCOUNTER — Other Ambulatory Visit (HOSPITAL_COMMUNITY): Payer: Self-pay | Admitting: Occupational Therapy

## 2024-02-21 DIAGNOSIS — R059 Cough, unspecified: Secondary | ICD-10-CM

## 2024-02-21 DIAGNOSIS — R1312 Dysphagia, oropharyngeal phase: Secondary | ICD-10-CM

## 2024-03-08 ENCOUNTER — Ambulatory Visit (HOSPITAL_COMMUNITY): Admitting: Speech Pathology

## 2024-03-08 ENCOUNTER — Other Ambulatory Visit (HOSPITAL_COMMUNITY)

## 2024-04-05 ENCOUNTER — Ambulatory Visit (HOSPITAL_COMMUNITY)
Admission: RE | Admit: 2024-04-05 | Discharge: 2024-04-05 | Disposition: A | Discharge status: Home / Self Care | Source: Ambulatory Visit | Attending: Otolaryngology | Admitting: Otolaryngology

## 2024-04-05 ENCOUNTER — Encounter (HOSPITAL_COMMUNITY): Payer: Self-pay | Admitting: Speech Pathology

## 2024-04-05 ENCOUNTER — Other Ambulatory Visit: Payer: Self-pay

## 2024-04-05 ENCOUNTER — Ambulatory Visit (HOSPITAL_COMMUNITY): Attending: Otolaryngology | Admitting: Speech Pathology

## 2024-04-05 DIAGNOSIS — R059 Cough, unspecified: Secondary | ICD-10-CM | POA: Diagnosis present

## 2024-04-05 DIAGNOSIS — R1312 Dysphagia, oropharyngeal phase: Secondary | ICD-10-CM | POA: Diagnosis present

## 2024-04-05 NOTE — Therapy (Signed)
 Modified Barium Swallow Study  Patient Details  Name: Allison [name] MRN: [id number] Date of Birth: 06/04/1951  Today's Date: 04/05/2024  HPI/PMH: HPI: Allison [name] is a 73 yo female who was referred for MBSS by Dr. Rogers Clayman (ENT) due to Pt reports of difficulty swallowing solid foods at times (meat and bread).  R13.14 (ICD-10-CM) - Dysphagia, pharyngoesophageal phase R09.A2 (ICD-10-CM) - Foreign body sensation, throat R05.9 (ICD-10-CM) - Cough   End of Session - 04/05/24 1442     Visit Number 1    Number of Visits 1    Authorization Type [employer]    SLP Start Time 1140    SLP Stop Time  1210    SLP Time Calculation (min) 30 min    Activity Tolerance Patient tolerated treatment well            Clinical Impression: Clinical Impression: Pt presents with normal oropharyngeal swallow with prompt swallow trigger and adequate hyolaryngeal excursion, epiglottic deflection, and tongue base retraction. Pt with variable trace, flash penetration of thins, which was immediately expelled and likely normal age variant which occurred during the swallow due to delayed laryngeal vestibule closure. No aspiration and no significant residuals noted. Esophageal sweep completed and appeared [facility]. Pt's symptoms/reports of difficulty swallowing meats (beef), bread rolls, and occasionally large pills may be due to esophageal dysphagia with referred symptoms to her neck area (she points to her neck as location of globus when this happens). Pt without pharyngeal weakness to explain her symptoms. Imaging from this assessment was reviewed with Pt and she was encouraged to take small bites of "troublesome foods" (beef and bread). Pt was given my contact information should she have any further questions of concerns. No further SLP services indicated at this time.   Factors that may increase risk of adverse event in presence of aspiration Roderick Civatte & Jessy Morocco 2021): No data  recorded  Recommendations/Plan: Swallowing Evaluation Recommendations Swallowing Evaluation Recommendations Recommendations: PO diet PO Diet Recommendation: Regular; Thin liquids (Level 0) (small bites of "challening" meats) Liquid Administration via: Cup; Straw Medication Administration: Whole meds with liquid Supervision: Patient able to self-feed Swallowing strategies  : Small bites/sips Postural changes: Position pt fully upright for meals; Stay upright 30-60 min after meals Oral care recommendations: Oral care BID (2x/day)    Treatment Plan Treatment Plan Treatment recommendations: No treatment recommended at this time Follow-up recommendations: No SLP follow up Functional status assessment: Patient has not had a recent decline in their functional status.     Recommendations Recommendations for follow up therapy are one component of a multi-disciplinary discharge planning process, led by the attending physician.  Recommendations may be updated based on patient status, additional functional criteria and insurance authorization.  Assessment: Orofacial Exam: Orofacial Exam Oral Cavity: Oral Hygiene: WFL Oral Cavity - Dentition: Adequate natural dentition Orofacial Anatomy: WFL Oral Motor/Sensory Function: WFL    Anatomy:  Anatomy: WFL   Boluses Administered: Boluses Administered Boluses Administered: Thin liquids (Level 0); Mildly thick liquids (Level 2, nectar thick); Puree; Solid     Oral Impairment Domain: Oral Impairment Domain Lip Closure: No labial escape Tongue control during bolus hold: Cohesive bolus between tongue to palatal seal Bolus preparation/mastication: Timely and efficient chewing and mashing Bolus transport/lingual motion: Brisk tongue motion Oral residue: Trace residue lining oral structures Location of oral residue : Tongue Initiation of pharyngeal swallow : Valleculae     Pharyngeal Impairment Domain: Pharyngeal Impairment  Domain Soft palate elevation: No bolus between soft palate (SP)/pharyngeal wall (  PW) Laryngeal elevation: Partial superior movement of thyroid  cartilage/partial approximation of arytenoids to epiglottic petiole Anterior hyoid excursion: Complete anterior movement Epiglottic movement: Complete inversion Laryngeal vestibule closure: Incomplete, narrow column air/contrast in laryngeal vestibule Pharyngeal stripping wave : Present - complete Pharyngeal contraction (A/P view only): N/A Pharyngoesophageal segment opening: Complete distension and complete duration, no obstruction of flow Tongue base retraction: No contrast between tongue base and posterior pharyngeal wall (PPW) Pharyngeal residue: Complete pharyngeal clearance Location of pharyngeal residue: N/A     Esophageal Impairment Domain: Esophageal Impairment Domain Esophageal clearance upright position: Complete clearance, esophageal coating    Pill: Pill Consistency administered: Thin liquids (Level 0) Thin liquids (Level 0): -- (flash penetration of thins during the swallow)    Penetration/Aspiration Scale Score: Penetration/Aspiration Scale Score 1.  Material does not enter airway: Mildly thick liquids (Level 2, nectar thick); Puree; Solid; Pill 2.  Material enters airway, remains ABOVE vocal cords then ejected out: Thin liquids (Level 0)    Compensatory Strategies: Compensatory Strategies Compensatory strategies: No       General Information: Caregiver present: No   Diet Prior to this Study: Regular; Thin liquids (Level 0)    Temperature : Normal    Respiratory Status: WFL    Supplemental O2: None (Room air)    History of Recent Intubation: No   Behavior/Cognition: Alert; Cooperative; Pleasant mood  Self-Feeding Abilities: Able to self-feed  Baseline vocal quality/speech: Normal  Volitional Cough: Able to elicit  Volitional Swallow: Able to elicit  Exam Limitations: No limitations  Pain: Pain  Assessment Pain Assessment: No/denies pain   Charges: SLP Evaluations $ SLP Speech Visit: 1 Visit  SLP Evaluations $MBS Swallow: 1 Procedure   SLP visit diagnosis: SLP Visit Diagnosis: Dysphagia, unspecified (R13.10)  Past Medical History:  Past Medical History:  Diagnosis Date   Medical history non-contributory    Rosacea    Past Surgical History:  Past Surgical History:  Procedure Laterality Date   BREAST BIOPSY Left 2002   benign   CATARACT EXTRACTION W/PHACO Right 07/06/2017   Procedure: CATARACT EXTRACTION PHACO AND INTRAOCULAR LENS PLACEMENT (IOC);  Surgeon: Clair Crews, MD;  Location: [facility];  Service: Ophthalmology;  Laterality: Right;  US  00:34.2 AP% 12.7 CDE 4.33 Fluid Pack lot # [id number]   CATARACT EXTRACTION W/PHACO Left 10/03/2019   Procedure: CATARACT EXTRACTION PHACO AND INTRAOCULAR LENS PLACEMENT (IOC) LEFT;  Surgeon: Clair Crews, MD;  Location: [facility];  Service: Ophthalmology;  Laterality: Left;  CDE 6.44 US  0:47.3   COLONOSCOPY WITH PROPOFOL  N/A 10/11/2018   Procedure: COLONOSCOPY WITH PROPOFOL ;  Surgeon: Marnee Sink, MD;  Location: [facility];  Service: Endoscopy;  Laterality: N/A;   Thank you,  Claudetta Cuba, CCC-SLP [phone]   Chrishon Martino 04/05/2024, 2:56 PM

## 2024-08-29 ENCOUNTER — Ambulatory Visit

## 2024-10-13 ENCOUNTER — Telehealth: Payer: Self-pay
